# Patient Record
Sex: Female | Born: 1935 | Race: White | Hispanic: No | Smoking: Never smoker
Health system: Southern US, Community
[De-identification: ages and names within clinical notes are randomized; demographics above are authoritative.]

## PROBLEM LIST (undated history)

## (undated) DIAGNOSIS — M1990 Unspecified osteoarthritis, unspecified site: Secondary | ICD-10-CM

## (undated) DIAGNOSIS — K219 Gastro-esophageal reflux disease without esophagitis: Secondary | ICD-10-CM

## (undated) DIAGNOSIS — E039 Hypothyroidism, unspecified: Secondary | ICD-10-CM

## (undated) DIAGNOSIS — H539 Unspecified visual disturbance: Secondary | ICD-10-CM

## (undated) DIAGNOSIS — E079 Disorder of thyroid, unspecified: Secondary | ICD-10-CM

## (undated) DIAGNOSIS — I2510 Atherosclerotic heart disease of native coronary artery without angina pectoris: Secondary | ICD-10-CM

## (undated) DIAGNOSIS — I779 Disorder of arteries and arterioles, unspecified: Secondary | ICD-10-CM

## (undated) DIAGNOSIS — R0600 Dyspnea, unspecified: Secondary | ICD-10-CM

## (undated) DIAGNOSIS — F32A Depression, unspecified: Secondary | ICD-10-CM

## (undated) DIAGNOSIS — H3530 Unspecified macular degeneration: Secondary | ICD-10-CM

## (undated) DIAGNOSIS — I739 Peripheral vascular disease, unspecified: Secondary | ICD-10-CM

## (undated) HISTORY — DX: Disorder of arteries and arterioles, unspecified: I77.9

## (undated) HISTORY — PX: CATARACT EXTRACTION, BILATERAL: SHX1313

## (undated) HISTORY — DX: Gastro-esophageal reflux disease without esophagitis: K21.9

## (undated) HISTORY — DX: Disorder of thyroid, unspecified: E07.9

## (undated) HISTORY — DX: Peripheral vascular disease, unspecified: I73.9

## (undated) HISTORY — PX: CHOLECYSTECTOMY: SHX55

## (undated) HISTORY — PX: ESOPHAGOGASTRODUODENOSCOPY ENDOSCOPY: SHX5814

## (undated) HISTORY — DX: Unspecified visual disturbance: H53.9

## (undated) HISTORY — PX: COLONOSCOPY: SHX174

---

## 1998-02-15 ENCOUNTER — Other Ambulatory Visit: Admission: RE | Admit: 1998-02-15 | Discharge: 1998-02-15 | Payer: Self-pay | Admitting: *Deleted

## 1999-03-08 ENCOUNTER — Other Ambulatory Visit: Admission: RE | Admit: 1999-03-08 | Discharge: 1999-03-08 | Payer: Self-pay | Admitting: *Deleted

## 2000-03-06 ENCOUNTER — Other Ambulatory Visit: Admission: RE | Admit: 2000-03-06 | Discharge: 2000-03-06 | Payer: Self-pay | Admitting: *Deleted

## 2000-05-25 ENCOUNTER — Encounter: Payer: Self-pay | Admitting: Occupational Medicine

## 2000-05-25 ENCOUNTER — Encounter: Admission: RE | Admit: 2000-05-25 | Discharge: 2000-05-25 | Payer: Self-pay | Admitting: Occupational Medicine

## 2001-02-09 ENCOUNTER — Ambulatory Visit (HOSPITAL_COMMUNITY): Admission: RE | Admit: 2001-02-09 | Discharge: 2001-02-09 | Payer: Self-pay | Admitting: *Deleted

## 2001-02-09 ENCOUNTER — Encounter (INDEPENDENT_AMBULATORY_CARE_PROVIDER_SITE_OTHER): Payer: Self-pay | Admitting: *Deleted

## 2001-02-25 ENCOUNTER — Other Ambulatory Visit: Admission: RE | Admit: 2001-02-25 | Discharge: 2001-02-25 | Payer: Self-pay | Admitting: *Deleted

## 2002-04-08 ENCOUNTER — Encounter (INDEPENDENT_AMBULATORY_CARE_PROVIDER_SITE_OTHER): Payer: Self-pay | Admitting: Specialist

## 2002-04-08 ENCOUNTER — Ambulatory Visit (HOSPITAL_COMMUNITY): Admission: RE | Admit: 2002-04-08 | Discharge: 2002-04-08 | Payer: Self-pay | Admitting: *Deleted

## 2004-02-14 ENCOUNTER — Encounter (INDEPENDENT_AMBULATORY_CARE_PROVIDER_SITE_OTHER): Payer: Self-pay | Admitting: Specialist

## 2004-02-14 ENCOUNTER — Ambulatory Visit (HOSPITAL_COMMUNITY): Admission: RE | Admit: 2004-02-14 | Discharge: 2004-02-14 | Payer: Self-pay | Admitting: *Deleted

## 2005-03-26 ENCOUNTER — Encounter (INDEPENDENT_AMBULATORY_CARE_PROVIDER_SITE_OTHER): Payer: Self-pay | Admitting: Specialist

## 2005-03-26 ENCOUNTER — Ambulatory Visit (HOSPITAL_COMMUNITY): Admission: RE | Admit: 2005-03-26 | Discharge: 2005-03-26 | Payer: Self-pay | Admitting: *Deleted

## 2005-04-24 ENCOUNTER — Encounter (HOSPITAL_COMMUNITY): Admission: RE | Admit: 2005-04-24 | Discharge: 2005-07-23 | Payer: Self-pay | Admitting: Internal Medicine

## 2005-05-06 ENCOUNTER — Ambulatory Visit (HOSPITAL_COMMUNITY): Admission: RE | Admit: 2005-05-06 | Discharge: 2005-05-06 | Payer: Self-pay | Admitting: Internal Medicine

## 2005-05-22 ENCOUNTER — Ambulatory Visit (HOSPITAL_COMMUNITY): Admission: RE | Admit: 2005-05-22 | Discharge: 2005-05-22 | Payer: Self-pay | Admitting: Internal Medicine

## 2006-03-26 ENCOUNTER — Encounter (INDEPENDENT_AMBULATORY_CARE_PROVIDER_SITE_OTHER): Payer: Self-pay | Admitting: *Deleted

## 2006-03-26 ENCOUNTER — Ambulatory Visit (HOSPITAL_COMMUNITY): Admission: RE | Admit: 2006-03-26 | Discharge: 2006-03-26 | Payer: Self-pay | Admitting: *Deleted

## 2006-11-27 ENCOUNTER — Encounter: Admission: RE | Admit: 2006-11-27 | Discharge: 2006-11-27 | Payer: Self-pay | Admitting: Internal Medicine

## 2006-11-27 ENCOUNTER — Inpatient Hospital Stay (HOSPITAL_COMMUNITY): Admission: EM | Admit: 2006-11-27 | Discharge: 2006-12-01 | Payer: Self-pay | Admitting: Emergency Medicine

## 2006-11-30 ENCOUNTER — Encounter (INDEPENDENT_AMBULATORY_CARE_PROVIDER_SITE_OTHER): Payer: Self-pay | Admitting: Specialist

## 2008-02-07 ENCOUNTER — Ambulatory Visit (HOSPITAL_COMMUNITY): Admission: RE | Admit: 2008-02-07 | Discharge: 2008-02-07 | Payer: Self-pay | Admitting: *Deleted

## 2008-02-07 ENCOUNTER — Encounter (INDEPENDENT_AMBULATORY_CARE_PROVIDER_SITE_OTHER): Payer: Self-pay | Admitting: *Deleted

## 2010-12-17 NOTE — Op Note (Signed)
NAME:  Donna Petty, FULLAM NO.:  [id number]   MEDICAL RECORD NO.:  [id number]          PATIENT TYPE:  AMB   LOCATION:  ENDO                         FACILITY:  [facility]   PHYSICIAN:  Georgiana Spinner, M.D.    DATE OF BIRTH:  1936/08/04   DATE OF PROCEDURE:  02/07/2008  DATE OF DISCHARGE:                               OPERATIVE REPORT   PROCEDURE:  Upper endoscopy with biopsy.   INDICATIONS:  GERD with Barrett's esophagus.   ANESTHESIA:  Fentanyl 50 mcg, Versed 5 mg.   DESCRIPTION OF PROCEDURE:  With the patient mildly sedated in the left  lateral decubitus position, the Pentax videoscopic endoscope was  inserted in the mouth, passed under direct vision through the esophagus  which appeared normal, but there were some changes, subtle, but there  was Barrett's esophagus photographed and biopsies taken.  We entered  into the stomach, fundus, body, antrum, duodenal bulb, and second  portion of the duodenum appeared normal.   From this point, the endoscope was slowly withdrawn taking  circumferential views of duodenal mucosa until the endoscope was then  pulled back and the stomach placed in retroflexion to view the stomach  from below.  The endoscope was straightened and withdrawn taking  circumferential views of the remaining gastric and esophageal mucosa.  The patient's vital signs and pulse oximeter remained stable.  The  patient tolerated the procedure well without apparent complications.   FINDINGS:  Changes of Barrett's esophagus biopsied, await biopsy report.  The patient will call me for results and follow up with me as an  outpatient.           ______________________________  Georgiana Spinner, M.D.     GMO/MEDQ  D:  02/07/2008  T:  02/07/2008  Job:  [id number]

## 2010-12-20 NOTE — Procedures (Signed)
[place]  Patient:    Donna Petty, Donna Petty                        MRN: [id number] Proc. Date: 02/09/01 Adm. Date:  [id number] Attending:  Sabino Gasser                           Procedure Report  PROCEDURE PERFORMED:  Upper endoscopy with biopsy.  ENDOSCOPIST:  Sabino Gasser, M.D.  INDICATIONS FOR PROCEDURE:  Barretts esophagus.  ANESTHESIA:  Demerol 40 mg, Versed 4 mg.  DESCRIPTION OF PROCEDURE:  With the patient mildly sedated in the left lateral decubitus position, the Olympus video endoscope was inserted in the mouth and passed under direct vision through the esophagus.  Barretts esophagus was seen and photographed.  Biopsies taken.  We entered into the stomach.  Fundus, body, antrum, duodenal bulb and second portion of the duodenum were all well-visualized.  Photographs were taken.  From this point, the endoscope was slowly withdrawn taking circumferential views of the entire duodenal mucosa until the endoscope had been pulled back into the stomach and placed on retroflexion to view the stomach from below and this was photographed.  The endoscope was then straightened and withdrawn taking circumferential views of the entire gastric mucosa and subsequently esophageal mucosa, all of which appeared normal.  Patients vital signs and pulse oximeter remained stable. The patient tolerated the procedure well without apparent complications.  FINDINGS:  Barretts esophagus above the hiatal hernia biopsied.  Await biopsy report.  The patient will call me for results and follow up with me as an outpatient.  Proceed to colonoscopy as planned. DD:  02/09/01 TD:  02/09/01 Job: [id number]

## 2010-12-20 NOTE — H&P (Signed)
NAME:  Donna Petty, Donna Petty NO.:  [id number]   MEDICAL RECORD NO.:  [id number]          PATIENT TYPE:  INP   LOCATION:  5707                         FACILITY:  [facility]   PHYSICIAN:  Maisie Fus A. Cornett, M.D.DATE OF BIRTH:  October 24, 1935   DATE OF ADMISSION:  11/27/2006  DATE OF DISCHARGE:                              HISTORY & PHYSICAL   CHIEF COMPLAINT:  Right upper quadrant pain.   HISTORY OF PRESENT ILLNESS:  The patient is a 75 year old female with a  1-1/2 day history of severe right upper quadrant pain which started  Wednesday.  The pain was severe associated with nausea and vomiting.  The pain today though is much better and has gone away.  She denies any  current abdominal pain and was able to keep a small amount of liquid and  food down this morning, but has not eaten much.  Denies any fever or  chills.  She has history of Barrett's esophagus and takes Nexium for  that and has had numerous endoscopies by Dr. Sabino Gasser.  The pain was  located in the right upper quadrant without radiation.  It was severe in  nature on Wednesday and then has gotten better since then.  She denies  any diarrhea, but had nausea and vomiting.  The pain seemed to lessen on  its own.   PAST MEDICAL HISTORY:  1. Barrett's esophagus.  2. Macular degeneration.  3. Hiatal hernia.   PAST SURGICAL HISTORY:  Numerous upper endoscopies and colonoscopies for  Barrett's and colonoscopy was negative as a screening.   SURGICAL HISTORY:  None.   FAMILY HISTORY:  Noncontributory.   SOCIAL HISTORY:  She lives by herself.  Denies tobacco or alcohol use.   ALLERGIES:  No known drug allergies.   MEDICATIONS:  Nexium, Estrogen hormone, multivitamin for macular  degeneration.   REVIEW OF SYSTEMS:  Otherwise negative except for as stated above.  Her  15-point review of systems negative.   PHYSICAL EXAMINATION:  VITAL SIGNS:  Pulse 80, respirations 20,  temperature 97.  GENERAL:  A white female  in no apparent distress out in the hallway.  HEENT:  No evidence of scleral icterus.  Oropharynx is moist.  NECK:  Supple, nontender, full range of motion.  Trachea midline without  mass.  PULMONARY:  Lungs clear to auscultation.  Chest wall motion normal.  CARDIOVASCULAR:  Regular rate and rhythm without murmur or gallop.  Good  perfusion of extremities.  ABDOMEN:  Soft, nontender.  No rebound, no guarding and specifically  negative Murphy's sign.  No tenderness to palpation.  Bowel sounds are  normoactive.  EXTREMITIES:  Full range of motion.  Muscle tone normal.  NEUROLOGIC:  Glasgow coma scale 15 with full range of motion.   LABORATORY DATA AND X-RAY FINDINGS:  CT scan which has a thickened  gallbladder wall with gallstones.   White count of 17,000 from her primary care physician's office.   IMPRESSION:  Acute cholecystitis, now much improved, but significant  findings on CT with a leukocytosis.   PLAN:  At this point in time, she  is not acutely ill, but does have an  elevated white count and thickened gallbladder with gallstones.  She  will need to have a cholecystectomy during this admission and will be  admitted for IV hydration for antibiotics and subsequent laparoscopic  cholecystectomy.  I have discussed with her daughter and the patient and  understand and agree to proceed.      Thomas A. Cornett, M.D.  Electronically Signed     TAC/MEDQ  D:  11/27/2006  T:  11/27/2006  Job:  [id number]   cc:   Jordan Hawks. Elnoria Howard, MD

## 2010-12-20 NOTE — Op Note (Signed)
NAME:  Donna Petty, Donna Petty                           ACCOUNT NO.:  [id number]   MEDICAL RECORD NO.:  [id number]                   PATIENT TYPE:  AMB   LOCATION:  ENDO                                 FACILITY:  [facility]   PHYSICIAN:  Georgiana Spinner, M.D.                 DATE OF BIRTH:  07-10-1936   DATE OF PROCEDURE:  02/14/2004  DATE OF DISCHARGE:                                 OPERATIVE REPORT   PROCEDURE:  Upper endoscopy with biopsy.   INDICATIONS:  Barrett's esophagus.   ANESTHESIA:  1. Demerol 40.  2. Versed 6 mg.   PROCEDURE:  With patient mildly sedated in the left lateral decubitus  position, the Olympus videoscopic endoscope was inserted in the mouth,  passed under direct vision through the esophagus which appeared normal until  we reached the distal esophagus and Barrett's was seen, photographed and  biopsied.  We entered into the stomach, fundus, body, antrum, duodenal bulb,  second portion; all appeared normal.  From this point the endoscope was  slowly withdrawn, taking circumferential views of duodenal mucosa until the  endoscope had been pulled back and the stomach placed in retroflexion to  view the stomach from below.  The endoscope was straightened and withdrawn,  taking circumferential views of the remaining gastric and esophageal mucosa.  The patient's vital signs and pulse oximeter remained stable.  The patient  tolerated the procedure well without apparent complications.   FINDINGS:  Barrett's esophagus biopsied.  Await biopsy report.  Patient will  call me for results and followup with me as an outpatient.                                               Georgiana Spinner, M.D.    GMO/MEDQ  D:  02/14/2004  T:  02/14/2004  Job:  [id number]

## 2010-12-20 NOTE — Op Note (Signed)
NAME:  Donna Petty, SCRIVNER NO.:  [id number]   MEDICAL RECORD NO.:  [id number]          PATIENT TYPE:  AMB   LOCATION:  ENDO                         FACILITY:  [facility]   PHYSICIAN:  Georgiana Spinner, M.D.    DATE OF BIRTH:  07-Aug-1935   DATE OF PROCEDURE:  03/26/2006  DATE OF DISCHARGE:                                 OPERATIVE REPORT   PROCEDURE:  Upper endoscopy.   INDICATIONS:  GERD.   ANESTHESIA:  Fentanyl 60 mcg, Versed 6 mg.   PROCEDURE:  With the patient mildly sedated in the left lateral decubitus  position, the Olympus videoscopic endoscope was inserted in the mouth and  passed under direct vision through the esophagus which appeared normal until  we reached distal esophagus and the squamocolumnar junction was somewhat  irregular with spotty islands of Barrett's.  We photographed this area and  biopsied this area.  We then entered into the stomach.  The fundus, body,  antrum, duodenal bulb, and second portion of the duodenum were visualized.  From this point, the endoscope was slowly withdrawn taking circumferential  views of the duodenal mucosa until the endoscope had been pulled back in the  stomach and placed in retroflexion to view the stomach from below. The  endoscope was then straightened and withdrawn taking circumferential views  of the remaining gastric and esophageal mucosa.  The patient's vital signs  and pulse oximeter remained stable.  The patient tolerated the procedure  well without apparent complications.   FINDINGS:  Barrett's esophagus above a hiatal hernia, await biopsy report.  The patient will call me for results and follow-up with me as an outpatient.           ______________________________  Georgiana Spinner, M.D.     GMO/MEDQ  D:  03/26/2006  T:  03/26/2006  Job:  [id number]

## 2010-12-20 NOTE — Op Note (Signed)
NAME:  Donna Petty, Donna Petty NO.:  [id number]   MEDICAL RECORD NO.:  [id number]          PATIENT TYPE:  AMB   LOCATION:  ENDO                         FACILITY:  [facility]   PHYSICIAN:  Georgiana Spinner, M.D.    DATE OF BIRTH:  [birth year]   DATE OF PROCEDURE:  03/26/2005  DATE OF DISCHARGE:                                 OPERATIVE REPORT   PROCEDURE:  Upper endoscopy.   INDICATIONS:  Gastroesophageal reflux disease with Barrett's esophagus.   ANESTHESIA:  Demerol 50, Versed 4 mg.   DESCRIPTION OF PROCEDURE:  With the patient mildly sedated in the left  lateral decubitus position, the Olympus videoscopic endoscope was inserted  in the mouth and passed under direct vision through the esophagus which  appeared normal until we reached the distal esophagus and although the  squamocolumnar junction was indistinct on close view, there appeared to be  areas of Barrett's esophagus which we photographed and biopsied. Also there  was a nodularity noted just where the stomach met the diaphragmatic opening  and this was biopsied and placed in the same container. We then entered into  the stomach. The fundus, body, antrum, duodenal bulb, and second portion of  duodenum all appeared normal. From this point, the endoscope was slowly  withdrawn taking circumferential views of the duodenal mucosa until the  endoscope had been pulled back in the stomach placed in retroflexion to view  the stomach from below. The endoscope was straightened and withdrawn taking  circumferential views of the remaining gastric and esophageal mucosa. The  patient's vital signs and pulse oximeter remained stable. The patient  tolerated the procedure well without apparent complications.   FINDINGS:  What appeared to be Barrett's esophagus biopsied, await biopsy  report. The patient will call me for results and follow-up with me as an  outpatient           ______________________________  Georgiana Spinner,  M.D.     GMO/MEDQ  D:  03/26/2005  T:  03/26/2005  Job:  [id number]

## 2010-12-20 NOTE — Op Note (Signed)
[place]  Patient:    Donna Petty, Donna Petty                        MRN: [id number] Proc. Date: 02/09/01 Adm. Date:  [id number] Attending:  Sabino Gasser                           Operative Report  PROCEDURE:  Colonoscopy.  INDICATIONS:  Colon cancer screening, hemoccult positivity.  ANESTHESIA:  None further given.  DESCRIPTION OF PROCEDURE:  With the patient mildly sedated in the left lateral decubitus position, the Olympus videoscopic colonoscope was inserted into the rectum, passed under direct vision to the cecum, identified by the ileocecal valve and appendiceal orifice, both of which were photographed.  From this point, the colonoscope was slowly withdrawn taking circumferential views of the entire colonic mucosa stopping only in the rectum which appeared normal in direct view and showed internal hemorrhoids on retroflexed view.  The endoscope was straightened and withdrawn.  The patients vital signs and pulse oximetry remained stable.  The patient tolerated the procedure well without apparent complications.  FINDINGS:  Internal hemorrhoids, otherwise unremarkable examination.  PLAN:  Have the patient follow up with me as described above. DD:  02/09/01 TD:  02/09/01 Job: [id number]

## 2010-12-20 NOTE — Op Note (Signed)
NAME:  Donna Petty, Donna Petty NO.:  [id number]   MEDICAL RECORD NO.:  [id number]          PATIENT TYPE:  INP   LOCATION:  5707                         FACILITY:  [facility]   PHYSICIAN:  Maisie Fus A. Cornett, M.D.DATE OF BIRTH:  1936/05/18   DATE OF PROCEDURE:  11/29/2006  DATE OF DISCHARGE:                               OPERATIVE REPORT   PREOPERATIVE DIAGNOSIS:  Acute cholecystitis.   POSTOPERATIVE DIAGNOSIS:  Acute cholecystitis.   PROCEDURE:  Laparoscopic cholecystectomy with intraoperative  cholangiogram.   SURGEON:  Thomas A. Cornett, M.D.   ANESTHESIA:  General endotracheal anesthesia.   ESTIMATED BLOOD LOSS:  Roughly 100 mL.   DRAINS:  One 60 Blake drain to gallbladder fossa.   SPECIMEN:  Gallbladder with gallstones to pathology.   INDICATIONS FOR PROCEDURE:  The patient is a 75 year old female admitted  with acute cholecystitis.  She is brought today to the operating room  for laparoscopic cholecystectomy with cholangiogram.  The procedure was  discussed with the patient as well as potential complications.  She  voiced her understanding and agreed to proceed.   DESCRIPTION OF PROCEDURE:  The patient was brought to the operating room  and placed supine.  After induction of general anesthesia, the abdomen  was prepped and draped in a sterile fashion.   A 1-cm infraumbilical incision was made.  Dissection was carried down to  the fascia.  The fascia was grasped with Kochers, and a small incision  was made.  I then was slightly off midline and reached down and grabbed  the posterior sheath and right side and made an incision with a scalpel  and entered the abdominal cavity without any difficulty.  I placed my  finger in and swept around and felt no evidence of adhesions or other  problem.  A pursestring suture of 0 Vicryl was placed, and a 12-mm  Hassan cannula was placed under direct vision.  Pneumoperitoneum was  created to 15 mmHg of CO2.  The laparoscope  was placed.  No evidence of  bowel or other reported injury.  The patient was placed in reverse  Trendelenburg and rolled to her left.  An 11-mm subxiphoid port was  placed under direct vision with local anesthesia.  Two 5-mm ports were  placed in the right midabdomen with local anesthesia under direct  vision.  The gallbladder was acutely inflamed.  I was able to grab the  dome and then pull the omentum off the gallbladder.  We did this  bluntly, with care taken not to injure the colon.  We were able then to  expose the infundibulum and grab it.  I then had to bluntly dissect the  loose tissue away from the infundibulum it between it and the duodenum.  This was done without any injury to the duodenum.  After this was done,  I was able to dissect out the cystic duct.  We were dissecting around  this and tore a small hole in the posterior part of the cystic duct.  I  went ahead and placed a clip on the gallbladder side and then opened  this hole a little bit more with scissors.  Through a separate stab  incision, I placed a Cook cholangiogram catheter into this and  controlled it with the clip.  Intraoperative cholangiogram was then  performed.  There was free flow of contrast down a very long posterior-  oriented cystic duct.  It inserted low in the common duct it looked  like.  There was one frame where a small air bubble went through.  Free  flow of contrast up the common hepatic duct into the right and left  hepatic ducts.  Contrast flowed through the common hepatic and common  bile duct into the duodenum without evidence of obstruction or delay.  I  did not see any evidence of extravasation.  At this point in time, I  removed the catheter.  I put four clips across the cystic duct stump and  divided the cystic duct in its entirety.  The cystic arteries were  identified.  There was an anterior and posterior branch of this.  It  took the anterior branch first between clips.  I then took  the posterior  branch as well with a single clip.  Cautery was used do dissect the  gallbladder from the gallbladder fossa.  The gallbladder was quite  inflamed and necrotic, and there was an area where the wall had a small  tear in it.  There was no evidence of any spillage of stones, but there  was purulent-like material within it.  Were able to control this with a  grasper.  I then used electrocautery to dissect the gallbladder from the  gallbladder fossa.  There were small veins that I controlled with clips  in the gallbladder fossa going to the gallbladder.  Once the gallbladder  was removed, I placed it in an EndoCatch bag.  I then examined the  gallbladder bed and found it to be quite oozy.  I then used irrigation  and cautery to help control oozing from the gallbladder bed and placed  Surgicel in the gallbladder bed.  I inspected the cystic duct stump and  cystic artery, and the clips were on these appropriately.  I then  elected to use a 19 Blake drain given the amount of oozing and amount of  bile spillage and placed this through the 5-mm port into the gallbladder  fossa and secured it with a stitch to the skin of 2-0 nylon.  The  remainder of the irrigation was suctioned out.  I then used the grasper  to grab the gallbladder bag that had the gallbladder in it and extracted  it through the umbilical port without difficulty.  This was passed off  the field and sent to pathology for further evaluation.  I then  reinserted the scope after I closed the umbilical port site with the 0  Vicryl already in place.  I suctioned out any excess irrigation and  flattened the patient out.  I saw no evidence of bile leakage or  bleeding.  At this point in time, I let the CO2 escape in its entirety  and withdrew all my ports without difficulty or bleeding.  A 4-0  Monocryl was used to close all skin incisions.  Steri-Strips and dry dressings were applied.  All final counts of sponge, needle and   instruments were found to be correct at this portion of the case.   The patient was then awakened and taken to recovery in satisfactory  condition.      Thomas A.  Cornett, M.D.  Electronically Signed     TAC/MEDQ  D:  11/29/2006  T:  11/29/2006  Job:  [id number]   cc:   Juline Patch, M.D.

## 2010-12-20 NOTE — Discharge Summary (Signed)
NAME:  ESHAL, Donna Petty NO.:  [id number]   MEDICAL RECORD NO.:  [id number]          PATIENT TYPE:  INP   LOCATION:  5707                         FACILITY:  [facility]   PHYSICIAN:  Leonie Man, M.D.   DATE OF BIRTH:  [birth year]   DATE OF ADMISSION:  11/27/2006  DATE OF DISCHARGE:  12/01/2006                               DISCHARGE SUMMARY   CHIEF COMPLAINT:  [name] is a 75 year old female patient with 1-1/2  days history of severe right upper quadrant pain associated with nausea  and vomiting. The pain was very severe prompting the patient to report  to the ER but by the time Maisie Fus A. Cornett, M.D. evaluated the patient  the pain had resolved. The patient also has a history of Barrett's  esophagitis and is followed by Georgiana Spinner, M.D. for this problem. In  the ER the patient was afebrile, vital signs were stable.  Abdomen soft,  nontender.  No Murphy's sign was noted. CT scan was done that  demonstrated thickened gallbladder wall with gallstones. White count had  been done.  The patient had been seen at the primary care physician's  office before presenting to the ER and this was 17,000.  The patient was  admitted with a diagnosis of cholelithiasis and acute cholecystitis.   HOSPITAL COURSE:  The patient was admitted to the hospital on November 28, 2006 with plans to proceed with a laparoscopic cholecystectomy in the  next 24 hours.  She was started on IV Cipro empirically, IV fluids and  plans were to proceed with surgery the following day.   On November 29, 2006 the patient did undergo a laparoscopic cholecystectomy  with intraoperative cholangiogram for a diagnosis of acute  cholecystitis.  The patient tolerated procedure well and one Blake drain  was placed in the OR and attached to JP bulb suction in the  postoperative period.   On postop day #1 the patient was stable.  Her preoperative total  bilirubin had been 2.4 and had decreased to 1.6.  She was  having about  120 mL out of her JP drain and immediately post surgery with anesthesia  still on board she had about 100 mL emesis.  She reports she did not  like clear liquids and has not otherwise advanced her diet was not  having any pain. Abdomen was soft.  Bowel sounds were present.  Her JP  was bloody serous, otherwise she was stable. Diet was advanced and  tolerating a solid she was advanced to oral Vicodin.  She was kept on IV  Cipro and plans were to leave her JP drain in place for now.   On December 01, 2006 the patient was seen by Leonie Man, M.D.  She had  been tolerating a diet and oral Vicodin and she had no further symptoms  and she was considered appropriate for discharge home.   DISCHARGE DIAGNOSES:  1. Cholelithiasis with acute cholecystitis and transaminitis.  2. Status post laparoscopic cholecystectomy.  3. History of Barrett's esophagitis.   DISCHARGE MEDICATIONS:  1. Vicodin 5/325 1-2 pills every  4 hours as needed for pain.  2. Premarin 0.625 mg daily.  3. Prometrium 100 mg daily.  4. Nexium 40 mg daily.  5. Glucosamine complex daily.  6. ICaps twice daily.  7. Multiple vitamin daily.  8. Calcium with vitamin D daily.   DIET:  No restrictions.   ACTIVITY:  Increase activity slowly.  May walk up steps.  May shower.  Follow up with Dr. Luisa Hart. Need to be seen in 1-2 weeks and need to  call for appointment.  Additionally instructions for laparoscopic  cholecystectomy were given to the patient. These were [facility]  instructions.  The patient is to call if she has fevers, new or  increasing abdominal pain and redness or drainage from her wound.      Allison L. Rennis Harding, N.P.      Leonie Man, M.D.  Electronically Signed    ALE/MEDQ  D:  01/06/2007  T:  01/07/2007  Job:  [id number]   cc:   Thomas A. Cornett, M.D.

## 2010-12-20 NOTE — Op Note (Signed)
   [name], Donna Petty NO.:  [id number]   MEDICAL RECORD NO.:  [id number]                   PATIENT TYPE:  AMB   LOCATION:  ENDO                                 FACILITY:  [facility]   PHYSICIAN:  Georgiana Spinner, M.D.                 DATE OF BIRTH:  01-17-1936   DATE OF PROCEDURE:  DATE OF DISCHARGE:                                 OPERATIVE REPORT   PROCEDURE:  Upper endoscopy with biopsy.   INDICATIONS FOR PROCEDURE:  Barrett's esophagus, gastroesophageal reflux  disease.   ANESTHESIA:  Demerol 50, Versed 5 mg.   DESCRIPTION OF PROCEDURE:  With the patient mildly sedated in the left  lateral decubitus position, the Olympus videoscopic endoscope was inserted  in the mouth and passed under direct vision through the esophagus which  appeared normal until we reached the distal esophagus and there were changes  of Barrett's esophagus seen, photographed and biopsied. The endoscope was  advanced to the stomach. The fundus, body, antrum, duodenal bulb and second  portion of the duodenum appeared normal. From this point, the endoscope was  slowly withdrawn taking circumferential views of the entire duodenal mucosa  until the endoscope was then pulled back in the stomach, placed in  retroflexion to view the stomach from below. The endoscope was then  straightened and withdrawn taking circumferential views of the remaining  gastric and esophageal mucosa. The patient's vital signs and pulse oximeter  remained stable. The patient tolerated the procedure well without apparent  complications.   FINDINGS:  Barrett's esophagus biopsied, await biopsy report. The patient  will call me for results and followup with me as an outpatient.                                                Georgiana Spinner, M.D.    GMO/MEDQ  D:  04/08/2002  T:  04/08/2002  Job:  [id number]

## 2011-02-13 ENCOUNTER — Other Ambulatory Visit: Payer: Self-pay | Admitting: Gastroenterology

## 2011-10-16 ENCOUNTER — Other Ambulatory Visit: Payer: Self-pay | Admitting: Obstetrics and Gynecology

## 2012-09-30 ENCOUNTER — Other Ambulatory Visit: Payer: Self-pay | Admitting: Gastroenterology

## 2013-08-17 ENCOUNTER — Other Ambulatory Visit (HOSPITAL_COMMUNITY): Payer: Self-pay | Admitting: Internal Medicine

## 2013-08-17 DIAGNOSIS — R05 Cough: Secondary | ICD-10-CM

## 2013-08-17 DIAGNOSIS — R059 Cough, unspecified: Secondary | ICD-10-CM

## 2013-08-17 LAB — PULMONARY FUNCTION TEST
DL/VA % pred: 95 %
DL/VA: 4.22 ml/min/mmHg/L
DLCO COR: 14.76 ml/min/mmHg
DLCO UNC % PRED: 72 %
DLCO cor % pred: 72 %
DLCO unc: 14.76 ml/min/mmHg
FEF 25-75 PRE: 1.25 L/s
FEF 25-75 Post: 1.1 L/sec
FEF2575-%CHANGE-POST: -12 %
FEF2575-%Pred-Post: 74 %
FEF2575-%Pred-Pre: 85 %
FEV1-%CHANGE-POST: -1 %
FEV1-%Pred-Post: 92 %
FEV1-%Pred-Pre: 94 %
FEV1-Post: 1.68 L
FEV1-Pre: 1.71 L
FEV1FVC-%Change-Post: 3 %
FEV1FVC-%Pred-Pre: 99 %
FEV6-%Change-Post: -4 %
FEV6-%PRED-POST: 93 %
FEV6-%Pred-Pre: 97 %
FEV6-POST: 2.17 L
FEV6-PRE: 2.26 L
FEV6FVC-%CHANGE-POST: 0 %
FEV6FVC-%PRED-PRE: 103 %
FEV6FVC-%Pred-Post: 104 %
FVC-%Change-Post: -5 %
FVC-%PRED-PRE: 93 %
FVC-%Pred-Post: 89 %
FVC-POST: 2.17 L
FVC-PRE: 2.29 L
POST FEV6/FVC RATIO: 100 %
Post FEV1/FVC ratio: 77 %
Pre FEV1/FVC ratio: 75 %
Pre FEV6/FVC Ratio: 99 %
RV % PRED: 90 %
RV: 1.94 L
TLC % PRED: 90 %
TLC: 4.16 L

## 2013-08-24 ENCOUNTER — Ambulatory Visit (HOSPITAL_COMMUNITY)
Admission: RE | Admit: 2013-08-24 | Discharge: 2013-08-24 | Disposition: A | Discharge status: Home / Self Care | Payer: Medicare Other | Source: Ambulatory Visit | Attending: Internal Medicine | Admitting: Internal Medicine

## 2013-08-24 DIAGNOSIS — R0602 Shortness of breath: Secondary | ICD-10-CM | POA: Insufficient documentation

## 2013-08-24 DIAGNOSIS — R059 Cough, unspecified: Secondary | ICD-10-CM | POA: Insufficient documentation

## 2013-08-24 DIAGNOSIS — R05 Cough: Secondary | ICD-10-CM | POA: Insufficient documentation

## 2013-08-24 MED ORDER — ALBUTEROL SULFATE (2.5 MG/3ML) 0.083% IN NEBU
2.5000 mg | INHALATION_SOLUTION | Freq: Once | RESPIRATORY_TRACT | Status: AC
  Administered 2013-08-24: 2.5 mg via RESPIRATORY_TRACT

## 2014-03-15 ENCOUNTER — Other Ambulatory Visit: Payer: Self-pay | Admitting: Internal Medicine

## 2014-03-15 DIAGNOSIS — R0989 Other specified symptoms and signs involving the circulatory and respiratory systems: Secondary | ICD-10-CM

## 2014-03-29 ENCOUNTER — Ambulatory Visit
Admission: RE | Admit: 2014-03-29 | Discharge: 2014-03-29 | Disposition: A | Discharge status: Home / Self Care | Payer: Medicare Other | Source: Ambulatory Visit | Attending: Internal Medicine | Admitting: Internal Medicine

## 2014-03-29 DIAGNOSIS — R0989 Other specified symptoms and signs involving the circulatory and respiratory systems: Secondary | ICD-10-CM

## 2014-04-05 ENCOUNTER — Telehealth: Payer: Self-pay | Admitting: Cardiovascular Disease

## 2014-04-06 NOTE — Telephone Encounter (Signed)
Closed encounter °

## 2014-04-27 ENCOUNTER — Encounter: Payer: Self-pay | Admitting: Cardiovascular Disease

## 2014-04-27 ENCOUNTER — Ambulatory Visit (INDEPENDENT_AMBULATORY_CARE_PROVIDER_SITE_OTHER): Payer: Medicare Other | Admitting: Cardiovascular Disease

## 2014-04-27 VITALS — BP 158/80 | HR 57 | Ht 61.0 in | Wt 166.4 lb

## 2014-04-27 DIAGNOSIS — I6529 Occlusion and stenosis of unspecified carotid artery: Secondary | ICD-10-CM

## 2014-04-27 DIAGNOSIS — I779 Disorder of arteries and arterioles, unspecified: Secondary | ICD-10-CM

## 2014-04-27 DIAGNOSIS — I739 Peripheral vascular disease, unspecified: Secondary | ICD-10-CM

## 2014-04-27 NOTE — Patient Instructions (Signed)
  We will see you back in follow up in 1 year with Dr Gwenlyn Found.   Dr Gwenlyn Found has ordered: Carotid Duplex (in 6 months)- This test is an ultrasound of the carotid arteries in your neck. It looks at blood flow through these arteries that supply the brain with blood. Allow one hour for this exam. There are no restrictions or special instructions.

## 2014-04-27 NOTE — Assessment & Plan Note (Signed)
The patient has asymptomatic moderate left internal carotid artery stenosis demonstrated by both ultrasound 03/29/14. Her systolic velocities to 64 and diastolic 66. She is neurologically astigmatic. This was obtained because of the os auscultative bruit. She does not have a history of diabetes, hypertension or hyperlipidemia. I suggested that she start baby aspirin and I will repeat her carotid Dopplers in 6 months.

## 2014-04-27 NOTE — Progress Notes (Signed)
04/27/2014 Donna Petty   May 29, 1936  [id number]  Primary Physician Donna Gravel, MD Primary Cardiologist: Donna Harp MD Donna Petty   HPI:   Donna Petty is a delightful 78 year old mildly overweight married Caucasian female mother of one daughter, Donna Petty, who accompanies her today. She was referred through the courtesy of Dr. Jani Petty from [facility] medical for evaluation of astigmatic carotid artery disease. She works as a Estate manager/land agent. She has no chronic risk factors, specifically denies tobacco abuse, hypertension, diabetes or hyperlipidemia. There is no family history of heart disease. She denies chest pain or shortness of breath. She has never had a heart attack or stroke. Recent carotid Dopplers performed 03/29/14 showed moderate left internal carotid artery stenosis and a 50-70% range.    Current Outpatient Prescriptions  Medication Sig Dispense Refill  . Calcium-Vitamin D 600-200 MG-UNIT per tablet Take 1 tablet by mouth daily.      Marland Kitchen glucosamine-chondroitin 500-400 MG tablet Take 1 tablet by mouth daily.      Marland Kitchen ibuprofen (ADVIL,MOTRIN) 200 MG tablet Take 200 mg by mouth as needed.      Marland Kitchen levothyroxine (SYNTHROID, LEVOTHROID) 50 MCG tablet       . Multiple Vitamins-Minerals (ICAPS PO) Take by mouth daily.      Marland Kitchen omeprazole (PRILOSEC) 40 MG capsule        No current facility-administered medications for this visit.    Not on File  History   Social History  . Marital Status: Married    Spouse Name: N/A    Number of Children: N/A  . Years of Education: N/A   Occupational History  . Not on file.   Social History Main Topics  . Smoking status: Never Smoker   . Smokeless tobacco: Not on file  . Alcohol Use: No  . Drug Use: No  . Sexual Activity: Not on file   Other Topics Concern  . Not on file   Social History Narrative  . No narrative on file     Review of Systems: General: negative for chills, fever, night sweats or weight changes.    Cardiovascular: negative for chest pain, dyspnea on exertion, edema, orthopnea, palpitations, paroxysmal nocturnal dyspnea or shortness of breath Dermatological: negative for rash Respiratory: negative for cough or wheezing Urologic: negative for hematuria Abdominal: negative for nausea, vomiting, diarrhea, bright red blood per rectum, melena, or hematemesis Neurologic: negative for visual changes, syncope, or dizziness All other systems reviewed and are otherwise negative except as noted above.    Blood pressure 158/80, pulse 57, height 5\' 1"  (1.549 m), weight 166 lb 6.4 oz (75.479 kg).  General appearance: alert and no distress Neck: no adenopathy, no JVD, supple, symmetrical, trachea midline, thyroid not enlarged, symmetric, no tenderness/mass/nodules and soft left carotid bruit Lungs: clear to auscultation bilaterally Heart: regular rate and rhythm, S1, S2 normal, no murmur, click, rub or gallop Extremities: extremities normal, atraumatic, no cyanosis or edema and 2+ pedal pulses bilaterally  EKG sinus bradycardia of 57 without ST or T wave changes  ASSESSMENT AND PLAN:   Left-sided carotid artery disease The patient has asymptomatic moderate left internal carotid artery stenosis demonstrated by both ultrasound 03/29/14. Her systolic velocities to 64 and diastolic 66. She is neurologically astigmatic. This was obtained because of the os auscultative bruit. She does not have a history of diabetes, hypertension or hyperlipidemia. I suggested that she start baby aspirin and I will repeat her carotid Dopplers in 6 months.  Donna Harp MD FACP,FACC,FAHA, [facility] 04/27/2014 10:48 AM

## 2014-10-25 ENCOUNTER — Ambulatory Visit (INDEPENDENT_AMBULATORY_CARE_PROVIDER_SITE_OTHER): Payer: Medicare Other | Admitting: Cardiovascular Disease

## 2014-10-25 ENCOUNTER — Encounter: Payer: Self-pay | Admitting: Cardiovascular Disease

## 2014-10-25 VITALS — BP 124/72 | HR 59 | Ht 61.0 in | Wt 156.0 lb

## 2014-10-25 DIAGNOSIS — I739 Peripheral vascular disease, unspecified: Principal | ICD-10-CM

## 2014-10-25 DIAGNOSIS — I779 Disorder of arteries and arterioles, unspecified: Secondary | ICD-10-CM

## 2014-10-25 NOTE — Patient Instructions (Signed)
Proceed with carotid dopplers 

## 2014-10-25 NOTE — Progress Notes (Signed)
The patient returned today for unclear reasons and has not yet had her carotid Doppler study which we will reorder. I will see her back based on those results.

## 2014-11-01 ENCOUNTER — Ambulatory Visit (HOSPITAL_COMMUNITY)
Admission: RE | Admit: 2014-11-01 | Discharge: 2014-11-01 | Disposition: A | Discharge status: Home / Self Care | Payer: Medicare Other | Source: Ambulatory Visit | Attending: Cardiovascular Disease | Admitting: Cardiovascular Disease

## 2014-11-01 DIAGNOSIS — I6529 Occlusion and stenosis of unspecified carotid artery: Secondary | ICD-10-CM | POA: Diagnosis present

## 2014-11-01 DIAGNOSIS — I779 Disorder of arteries and arterioles, unspecified: Secondary | ICD-10-CM | POA: Diagnosis not present

## 2014-11-01 NOTE — Progress Notes (Signed)
Carotid Duplex Completed. Asal Teas, BS, RDMS, RVT  

## 2014-11-06 ENCOUNTER — Encounter: Payer: Self-pay | Admitting: *Deleted

## 2015-01-23 ENCOUNTER — Other Ambulatory Visit: Payer: Self-pay | Admitting: Gastroenterology

## 2019-03-30 ENCOUNTER — Other Ambulatory Visit: Payer: Self-pay | Admitting: Internal Medicine

## 2019-03-30 DIAGNOSIS — R413 Other amnesia: Secondary | ICD-10-CM

## 2019-04-08 ENCOUNTER — Ambulatory Visit
Admission: RE | Admit: 2019-04-08 | Discharge: 2019-04-08 | Disposition: A | Discharge status: Home / Self Care | Payer: Medicare Other | Source: Ambulatory Visit | Attending: Internal Medicine | Admitting: Internal Medicine

## 2019-04-08 DIAGNOSIS — R413 Other amnesia: Secondary | ICD-10-CM

## 2019-09-26 ENCOUNTER — Other Ambulatory Visit: Payer: Self-pay | Admitting: Internal Medicine

## 2019-09-26 DIAGNOSIS — M79662 Pain in left lower leg: Secondary | ICD-10-CM

## 2019-09-27 ENCOUNTER — Ambulatory Visit
Admission: RE | Admit: 2019-09-27 | Discharge: 2019-09-27 | Disposition: A | Discharge status: Home / Self Care | Payer: Medicare PPO | Source: Ambulatory Visit | Attending: Internal Medicine | Admitting: Internal Medicine

## 2019-09-27 DIAGNOSIS — M79662 Pain in left lower leg: Secondary | ICD-10-CM

## 2019-11-08 ENCOUNTER — Encounter: Payer: Medicare PPO | Admitting: Vascular Surgery

## 2019-11-08 ENCOUNTER — Encounter (HOSPITAL_COMMUNITY): Payer: Medicare PPO

## 2019-11-14 DIAGNOSIS — M79605 Pain in left leg: Secondary | ICD-10-CM | POA: Diagnosis not present

## 2019-11-14 DIAGNOSIS — M79651 Pain in right thigh: Secondary | ICD-10-CM | POA: Diagnosis not present

## 2019-11-14 DIAGNOSIS — M79652 Pain in left thigh: Secondary | ICD-10-CM | POA: Diagnosis not present

## 2019-11-14 DIAGNOSIS — M79604 Pain in right leg: Secondary | ICD-10-CM | POA: Diagnosis not present

## 2019-11-17 DIAGNOSIS — I10 Essential (primary) hypertension: Secondary | ICD-10-CM | POA: Diagnosis not present

## 2019-11-17 DIAGNOSIS — E039 Hypothyroidism, unspecified: Secondary | ICD-10-CM | POA: Diagnosis not present

## 2019-11-17 DIAGNOSIS — R413 Other amnesia: Secondary | ICD-10-CM | POA: Diagnosis not present

## 2019-11-24 DIAGNOSIS — Z8673 Personal history of transient ischemic attack (TIA), and cerebral infarction without residual deficits: Secondary | ICD-10-CM | POA: Diagnosis not present

## 2019-11-24 DIAGNOSIS — E039 Hypothyroidism, unspecified: Secondary | ICD-10-CM | POA: Diagnosis not present

## 2019-11-24 DIAGNOSIS — M1712 Unilateral primary osteoarthritis, left knee: Secondary | ICD-10-CM | POA: Diagnosis not present

## 2019-11-24 DIAGNOSIS — I10 Essential (primary) hypertension: Secondary | ICD-10-CM | POA: Diagnosis not present

## 2019-11-24 DIAGNOSIS — M1711 Unilateral primary osteoarthritis, right knee: Secondary | ICD-10-CM | POA: Diagnosis not present

## 2019-11-24 DIAGNOSIS — R413 Other amnesia: Secondary | ICD-10-CM | POA: Diagnosis not present

## 2019-11-24 DIAGNOSIS — I872 Venous insufficiency (chronic) (peripheral): Secondary | ICD-10-CM | POA: Diagnosis not present

## 2019-11-24 DIAGNOSIS — E785 Hyperlipidemia, unspecified: Secondary | ICD-10-CM | POA: Diagnosis not present

## 2019-11-24 DIAGNOSIS — Z0000 Encounter for general adult medical examination without abnormal findings: Secondary | ICD-10-CM | POA: Diagnosis not present

## 2019-11-24 DIAGNOSIS — M170 Bilateral primary osteoarthritis of knee: Secondary | ICD-10-CM | POA: Diagnosis not present

## 2019-11-24 DIAGNOSIS — K2270 Barrett's esophagus without dysplasia: Secondary | ICD-10-CM | POA: Diagnosis not present

## 2019-11-24 DIAGNOSIS — K219 Gastro-esophageal reflux disease without esophagitis: Secondary | ICD-10-CM | POA: Diagnosis not present

## 2020-02-27 DIAGNOSIS — M170 Bilateral primary osteoarthritis of knee: Secondary | ICD-10-CM | POA: Diagnosis not present

## 2020-02-27 DIAGNOSIS — M1712 Unilateral primary osteoarthritis, left knee: Secondary | ICD-10-CM | POA: Diagnosis not present

## 2020-02-27 DIAGNOSIS — M1711 Unilateral primary osteoarthritis, right knee: Secondary | ICD-10-CM | POA: Diagnosis not present

## 2020-03-05 DIAGNOSIS — J40 Bronchitis, not specified as acute or chronic: Secondary | ICD-10-CM | POA: Diagnosis not present

## 2020-03-05 DIAGNOSIS — R05 Cough: Secondary | ICD-10-CM | POA: Diagnosis not present

## 2020-03-16 DIAGNOSIS — R110 Nausea: Secondary | ICD-10-CM | POA: Diagnosis not present

## 2020-03-16 DIAGNOSIS — J0110 Acute frontal sinusitis, unspecified: Secondary | ICD-10-CM | POA: Diagnosis not present

## 2020-04-04 DIAGNOSIS — D3131 Benign neoplasm of right choroid: Secondary | ICD-10-CM | POA: Diagnosis not present

## 2020-04-04 DIAGNOSIS — H0100A Unspecified blepharitis right eye, upper and lower eyelids: Secondary | ICD-10-CM | POA: Diagnosis not present

## 2020-04-04 DIAGNOSIS — H04123 Dry eye syndrome of bilateral lacrimal glands: Secondary | ICD-10-CM | POA: Diagnosis not present

## 2020-04-04 DIAGNOSIS — H353112 Nonexudative age-related macular degeneration, right eye, intermediate dry stage: Secondary | ICD-10-CM | POA: Diagnosis not present

## 2020-04-04 DIAGNOSIS — H35342 Macular cyst, hole, or pseudohole, left eye: Secondary | ICD-10-CM | POA: Diagnosis not present

## 2020-05-17 DIAGNOSIS — I10 Essential (primary) hypertension: Secondary | ICD-10-CM | POA: Diagnosis not present

## 2020-05-17 DIAGNOSIS — E039 Hypothyroidism, unspecified: Secondary | ICD-10-CM | POA: Diagnosis not present

## 2020-05-17 DIAGNOSIS — E785 Hyperlipidemia, unspecified: Secondary | ICD-10-CM | POA: Diagnosis not present

## 2020-05-29 DIAGNOSIS — K2270 Barrett's esophagus without dysplasia: Secondary | ICD-10-CM | POA: Diagnosis not present

## 2020-05-29 DIAGNOSIS — R413 Other amnesia: Secondary | ICD-10-CM | POA: Diagnosis not present

## 2020-05-29 DIAGNOSIS — I10 Essential (primary) hypertension: Secondary | ICD-10-CM | POA: Diagnosis not present

## 2020-05-29 DIAGNOSIS — K219 Gastro-esophageal reflux disease without esophagitis: Secondary | ICD-10-CM | POA: Diagnosis not present

## 2020-05-29 DIAGNOSIS — Z23 Encounter for immunization: Secondary | ICD-10-CM | POA: Diagnosis not present

## 2020-05-29 DIAGNOSIS — E039 Hypothyroidism, unspecified: Secondary | ICD-10-CM | POA: Diagnosis not present

## 2020-05-29 DIAGNOSIS — I872 Venous insufficiency (chronic) (peripheral): Secondary | ICD-10-CM | POA: Diagnosis not present

## 2020-05-29 DIAGNOSIS — Z8673 Personal history of transient ischemic attack (TIA), and cerebral infarction without residual deficits: Secondary | ICD-10-CM | POA: Diagnosis not present

## 2020-05-29 DIAGNOSIS — E785 Hyperlipidemia, unspecified: Secondary | ICD-10-CM | POA: Diagnosis not present

## 2020-05-31 DIAGNOSIS — M25562 Pain in left knee: Secondary | ICD-10-CM | POA: Diagnosis not present

## 2020-05-31 DIAGNOSIS — M25561 Pain in right knee: Secondary | ICD-10-CM | POA: Diagnosis not present

## 2020-05-31 DIAGNOSIS — M1711 Unilateral primary osteoarthritis, right knee: Secondary | ICD-10-CM | POA: Diagnosis not present

## 2020-05-31 DIAGNOSIS — M170 Bilateral primary osteoarthritis of knee: Secondary | ICD-10-CM | POA: Diagnosis not present

## 2020-06-13 NOTE — H&P (Signed)
TOTAL KNEE ADMISSION H&P  Patient is being admitted for left total knee arthroplasty.  Subjective:  Chief Complaint:  Left knee OA / pain  HPI: Donna Petty, 84 y.o. female, has a history of pain and functional disability in the left knee due to arthritis and has failed non-surgical conservative treatments for greater than 12 weeks to include NSAID's and/or analgesics, corticosteriod injections, use of assistive devices and activity modification.  Onset of symptoms was gradual, starting years ago with gradually worsening course since that time. The patient noted no past surgery on the left knee.  Patient currently rates pain in the left knee at 9 out of 10 with activity. Patient has worsening of pain with activity and weight bearing, pain that interferes with activities of daily living, pain with passive range of motion, crepitus and joint swelling.  Patient has evidence of periarticular osteophytes and joint space narrowing by imaging studies. There is no active infection.  Risks, benefits and expectations were discussed with the patient.  Risks including but not limited to the risk of anesthesia, blood clots, nerve damage, blood vessel damage, failure of the prosthesis, infection and up to and including death.  Patient understand the risks, benefits and expectations and wishes to proceed with surgery.   PCP: Jani Gravel, MD  D/C Plans:       Home   Post-op Meds:       No Rx given  Tranexamic Acid:      To be given - IV   Decadron:      Is to be given  FYI:      ASA  [place]  DME:   Pt equipment arranged  PT:   OPPT @ [facility]  Pharmacy: [place], [place], [place]   Patient Active Problem List   Diagnosis Date Noted  . Left-sided carotid artery disease ([place]) 04/27/2014   Past Medical History:  Diagnosis Date  . Acid reflux   . Carotid artery disease    50-70% left internal carotid artery stenosis  . Thyroid condition   . Visual changes     No past surgical  history on file.   No current facility-administered medications for this encounter.   Current Outpatient Medications  Medication Sig Dispense Refill Last Dose  . Calcium-Vitamin D 600-200 MG-UNIT per tablet Take 1 tablet by mouth daily.     Marland Kitchen glucosamine-chondroitin 500-400 MG tablet Take 1 tablet by mouth daily.     Marland Kitchen ibuprofen (ADVIL,MOTRIN) 200 MG tablet Take 200 mg by mouth as needed.     Marland Kitchen levothyroxine (SYNTHROID, LEVOTHROID) 25 MCG tablet Take 25 mcg by mouth daily before breakfast.      . Multiple Vitamins-Minerals (ICAPS PO) Take by mouth daily.     Marland Kitchen omeprazole (PRILOSEC) 40 MG capsule       No Known Allergies   Social History   Tobacco Use  . Smoking status: Never Smoker  Substance Use Topics  . Alcohol use: No       Review of Systems  Constitutional: Negative.   HENT: Negative.   Eyes: Negative.   Respiratory: Negative.   Cardiovascular: Negative.   Gastrointestinal: Positive for heartburn.  Genitourinary: Negative.   Musculoskeletal: Positive for joint pain.  Skin: Negative.   Neurological: Negative.   Endo/Heme/Allergies: Negative.   Psychiatric/Behavioral: Negative.      Objective:  Physical Exam Constitutional:      Appearance: She is well-developed.  HENT:     Head: Normocephalic.  Eyes:  Pupils: Pupils are equal, round, and reactive to light.  Neck:     Thyroid: No thyromegaly.     Vascular: No JVD.     Trachea: No tracheal deviation.  Cardiovascular:     Rate and Rhythm: Normal rate and regular rhythm.  Pulmonary:     Effort: Pulmonary effort is normal. No respiratory distress.     Breath sounds: Normal breath sounds. No wheezing.  Abdominal:     Palpations: Abdomen is soft.     Tenderness: There is no abdominal tenderness. There is no guarding.  Musculoskeletal:     Cervical back: Neck supple.     Left knee: Swelling and bony tenderness present. No erythema or ecchymosis. Decreased range of motion. Tenderness present.   Lymphadenopathy:     Cervical: No cervical adenopathy.  Skin:    General: Skin is warm and dry.  Neurological:     Mental Status: She is alert and oriented to person, place, and time.       Labs:  Estimated body mass index is 29.48 kg/m as calculated from the following:   Height as of 10/25/14: 5\' 1"  (1.549 m).   Weight as of 10/25/14: 70.8 kg.   Imaging Review Plain radiographs demonstrate severe degenerative joint disease of the left knee. The bone quality appears to be good for age and reported activity level.      Assessment/Plan:  End stage arthritis, left knee   The patient history, physical examination, clinical judgment of the provider and imaging studies are consistent with end stage degenerative joint disease of the left knee and total knee arthroplasty is deemed medically necessary. The treatment options including medical management, injection therapy arthroscopy and arthroplasty were discussed at length. The risks and benefits of total knee arthroplasty were presented and reviewed. The risks due to aseptic loosening, infection, stiffness, patella tracking problems, thromboembolic complications and other imponderables were discussed. The patient acknowledged the explanation, agreed to proceed with the plan and consent was signed. Patient is being admitted for treatment for surgery, pain control, PT, OT, prophylactic antibiotics, VTE prophylaxis, progressive ambulation and ADL's and discharge planning. The patient is planning to be discharged home.     Patient's anticipated LOS is less than 2 midnights, meeting these requirements: - Lives within 1 hour of care - Has a competent adult at home to recover with post-op recover - NO history of  - Chronic pain requiring opiods  - Diabetes  - Heart failure  - Heart attack  - Stroke  - DVT/VTE  - Cardiac arrhythmia  - Respiratory Failure/COPD  - Renal failure  - Anemia  - Advanced Liver disease      [place].  Jyssica Rief   PA-C  06/13/2020, 10:19 AM

## 2020-06-15 NOTE — Patient Instructions (Signed)
DUE TO COVID-19 ONLY ONE VISITOR IS ALLOWED TO COME WITH YOU AND STAY IN THE WAITING ROOM ONLY DURING PRE OP AND PROCEDURE DAY OF SURGERY. THE 1 VISITOR  MAY VISIT WITH YOU AFTER SURGERY IN YOUR PRIVATE ROOM DURING VISITING HOURS ONLY!  YOU NEED TO HAVE A COVID 19 TEST ON: 06/22/20@           , THIS TEST MUST BE DONE BEFORE SURGERY,  COVID TESTING SITE [address] [place] [address], IT IS ON THE RIGHT GOING OUT [address] APPROXIMATELY  2 MINUTES PAST [employer] ON THE RIGHT. ONCE YOUR COVID TEST IS COMPLETED,  PLEASE BEGIN THE QUARANTINE INSTRUCTIONS AS OUTLINED IN YOUR HANDOUT.                Donna Petty    Your procedure is scheduled on: 06/26/20   Report to [facility] Main  Entrance   Report to admitting at: 6:30 AM     Call this number if you have problems the morning of surgery [phone]    Remember: NO SOLID FOOD AFTER MIDNIGHT THE NIGHT PRIOR TO SURGERY. NOTHING BY MOUTH EXCEPT CLEAR LIQUIDS UNTIL: 6:00 AM . PLEASE FINISH ENSURE DRINK PER SURGEON ORDER  WHICH NEEDS TO BE COMPLETED AT: 6:30 AM .  CLEAR LIQUID DIET   Foods Allowed                                                                     Foods Excluded  Coffee and tea, regular and decaf                             liquids that you cannot  Plain Jell-O any favor except red or purple                                           see through such as: Fruit ices (not with fruit pulp)                                     milk, soups, orange juice  Iced Popsicles                                    All solid food Carbonated beverages, regular and diet                                    Cranberry, grape and apple juices Sports drinks like Gatorade Lightly seasoned clear broth or consume(fat free) Sugar, honey syrup  Sample Menu Breakfast                                Lunch  Supper Cranberry juice                    Beef broth                             Chicken broth Jell-O                                     Grape juice                           Apple juice Coffee or tea                        Jell-O                                      Popsicle                                                Coffee or tea                        Coffee or tea  _____________________________________________________________________   BRUSH YOUR TEETH MORNING OF SURGERY AND RINSE YOUR MOUTH OUT, NO CHEWING GUM CANDY OR MINTS.     Take these medicines the morning of surgery with A SIP OF WATER: cetirizine(zyrtec),synthroid,omeprazole.                               You may not have any metal on your body including hair pins and              piercings  Do not wear jewelry, make-up, lotions, powders or perfumes, deodorant             Do not wear nail polish on your fingernails.  Do not shave  48 hours prior to surgery.    Do not bring valuables to the hospital. [place].  Contacts, dentures or bridgework may not be worn into surgery.  Leave suitcase in the car. After surgery it may be brought to your room.     Patients discharged the day of surgery will not be allowed to drive home. IF YOU ARE HAVING SURGERY AND GOING HOME THE SAME DAY, YOU MUST HAVE AN ADULT TO DRIVE YOU HOME AND BE WITH YOU FOR 24 HOURS. YOU MAY GO HOME BY TAXI OR UBER OR ORTHERWISE, BUT AN ADULT MUST ACCOMPANY YOU HOME AND STAY WITH YOU FOR 24 HOURS.  Name and phone number of your driver:  Special Instructions: N/A              Please read over the following fact sheets you were given: _____________________________________________________________________        [facility] - Preparing for Surgery Before surgery, you can play an important role.  Because skin is not sterile, your skin needs to be as free of germs as possible.  You can  reduce the number of germs on your skin by washing with CHG (chlorahexidine gluconate) soap before surgery.   CHG is an antiseptic cleaner which kills germs and bonds with the skin to continue killing germs even after washing. Please DO NOT use if you have an allergy to CHG or antibacterial soaps.  If your skin becomes reddened/irritated stop using the CHG and inform your nurse when you arrive at Short Stay. Do not shave (including legs and underarms) for at least 48 hours prior to the first CHG shower.  You may shave your face/neck. Please follow these instructions carefully:  1.  Shower with CHG Soap the night before surgery and the  morning of Surgery.  2.  If you choose to wash your hair, wash your hair first as usual with your  normal  shampoo.  3.  After you shampoo, rinse your hair and body thoroughly to remove the  shampoo.                           4.  Use CHG as you would any other liquid soap.  You can apply chg directly  to the skin and wash                       Gently with a scrungie or clean washcloth.  5.  Apply the CHG Soap to your body ONLY FROM THE NECK DOWN.   Do not use on face/ open                           Wound or open sores. Avoid contact with eyes, ears mouth and genitals (private parts).                       Wash face,  Genitals (private parts) with your normal soap.             6.  Wash thoroughly, paying special attention to the area where your surgery  will be performed.  7.  Thoroughly rinse your body with warm water from the neck down.  8.  DO NOT shower/wash with your normal soap after using and rinsing off  the CHG Soap.                9.  Pat yourself dry with a clean towel.            10.  Wear clean pajamas.            11.  Place clean sheets on your bed the night of your first shower and do not  sleep with pets. Day of Surgery : Do not apply any lotions/deodorants the morning of surgery.  Please wear clean clothes to the hospital/surgery center.  FAILURE TO FOLLOW THESE INSTRUCTIONS MAY RESULT IN THE CANCELLATION OF YOUR SURGERY PATIENT  SIGNATURE_________________________________  NURSE SIGNATURE__________________________________  ________________________________________________________________________   Donna Petty  An incentive spirometer is a tool that can help keep your lungs clear and active. This tool measures how well you are filling your lungs with each breath. Taking long deep breaths may help reverse or decrease the chance of developing breathing (pulmonary) problems (especially infection) following:  A long period of time when you are unable to move or be active. BEFORE THE PROCEDURE   If the spirometer includes an indicator to show your best effort, your nurse or respiratory therapist will set it  to a desired goal.  If possible, sit up straight or lean slightly forward. Try not to slouch.  Hold the incentive spirometer in an upright position. INSTRUCTIONS FOR USE  1. Sit on the edge of your bed if possible, or sit up as far as you can in bed or on a chair. 2. Hold the incentive spirometer in an upright position. 3. Breathe out normally. 4. Place the mouthpiece in your mouth and seal your lips tightly around it. 5. Breathe in slowly and as deeply as possible, raising the piston or the ball toward the top of the column. 6. Hold your breath for 3-5 seconds or for as long as possible. Allow the piston or ball to fall to the bottom of the column. 7. Remove the mouthpiece from your mouth and breathe out normally. 8. Rest for a few seconds and repeat Steps 1 through 7 at least 10 times every 1-2 hours when you are awake. Take your time and take a few normal breaths between deep breaths. 9. The spirometer may include an indicator to show your best effort. Use the indicator as a goal to work toward during each repetition. 10. After each set of 10 deep breaths, practice coughing to be sure your lungs are clear. If you have an incision (the cut made at the time of surgery), support your incision when coughing  by placing a pillow or rolled up towels firmly against it. Once you are able to get out of bed, walk around indoors and cough well. You may stop using the incentive spirometer when instructed by your caregiver.  RISKS AND COMPLICATIONS  Take your time so you do not get dizzy or light-headed.  If you are in pain, you may need to take or ask for pain medication before doing incentive spirometry. It is harder to take a deep breath if you are having pain. AFTER USE  Rest and breathe slowly and easily.  It can be helpful to keep track of a log of your progress. Your caregiver can provide you with a simple table to help with this. If you are using the spirometer at home, follow these instructions: [place] IF:   You are having difficultly using the spirometer.  You have trouble using the spirometer as often as instructed.  Your pain medication is not giving enough relief while using the spirometer.  You develop fever of 100.5 F (38.1 C) or higher. SEEK IMMEDIATE MEDICAL CARE IF:   You cough up bloody sputum that had not been present before.  You develop fever of 102 F (38.9 C) or greater.  You develop worsening pain at or near the incision site. MAKE SURE YOU:   Understand these instructions.  Will watch your condition.  Will get help right away if you are not doing well or get worse. Document Released: 12/01/2006 Document Revised: 10/13/2011 Document Reviewed: 02/01/2007 [facility] Patient Information 2014 [place], [place].   ________________________________________________________________________

## 2020-06-18 ENCOUNTER — Encounter (HOSPITAL_COMMUNITY): Payer: Self-pay

## 2020-06-18 ENCOUNTER — Encounter (HOSPITAL_COMMUNITY)
Admission: RE | Admit: 2020-06-18 | Discharge: 2020-06-18 | Disposition: A | Discharge status: Home / Self Care | Payer: Medicare PPO | Source: Ambulatory Visit | Attending: Orthopedic Surgery | Admitting: Orthopedic Surgery

## 2020-06-18 ENCOUNTER — Other Ambulatory Visit: Payer: Self-pay

## 2020-06-18 DIAGNOSIS — Z01818 Encounter for other preprocedural examination: Secondary | ICD-10-CM | POA: Insufficient documentation

## 2020-06-18 HISTORY — DX: Unspecified osteoarthritis, unspecified site: M19.90

## 2020-06-18 HISTORY — DX: Atherosclerotic heart disease of native coronary artery without angina pectoris: I25.10

## 2020-06-18 HISTORY — DX: Hypothyroidism, unspecified: E03.9

## 2020-06-18 LAB — BASIC METABOLIC PANEL
Anion gap: 9 (ref 5–15)
BUN: 14 mg/dL (ref 8–23)
CO2: 26 mmol/L (ref 22–32)
Calcium: 8.9 mg/dL (ref 8.9–10.3)
Chloride: 105 mmol/L (ref 98–111)
Creatinine, Ser: 1.23 mg/dL — ABNORMAL HIGH (ref 0.44–1.00)
GFR, Estimated: 44 mL/min — ABNORMAL LOW (ref >60)
Glucose, Bld: 100 mg/dL — ABNORMAL HIGH (ref 70–99)
Potassium: 4.4 mmol/L (ref 3.5–5.1)
Sodium: 140 mmol/L (ref 135–145)

## 2020-06-18 LAB — CBC
HCT: 41.1 % (ref 36.0–46.0)
Hemoglobin: 13.6 g/dL (ref 12.0–15.0)
MCH: 31.7 pg (ref 26.0–34.0)
MCHC: 33.1 g/dL (ref 30.0–36.0)
MCV: 95.8 fL (ref 80.0–100.0)
Platelets: 288 10*3/uL (ref 150–400)
RBC: 4.29 MIL/uL (ref 3.87–5.11)
RDW: 13.6 % (ref 11.5–15.5)
WBC: 9.5 10*3/uL (ref 4.0–10.5)
nRBC: 0 % (ref 0.0–0.2)

## 2020-06-18 LAB — SURGICAL PCR SCREEN
MRSA, PCR: NEGATIVE
Staphylococcus aureus: POSITIVE — AB

## 2020-06-18 NOTE — Progress Notes (Signed)
COVID Vaccine Completed: Yes Date COVID Vaccine completed: 12/21/19 COVID vaccine manufacturer:  [employer]   PCP - Dr. Jani Gravel. Clearance appointment on: 06/20/20 Cardiologist - Dr. Fulton Reek.  Chest x-ray -  EKG - Requested at [facility]. Stress Test -  ECHO -  Cardiac Cath -  Pacemaker/ICD device last checked:  Sleep Study -  CPAP -   Fasting Blood Sugar -  Checks Blood Sugar _____ times a day  Blood Thinner Instructions: Aspirin Instructions: To hold 7 days before as per surgeon's instructions. Last Dose:  Anesthesia review: Hx: Carotic stenosis.  Patient denies shortness of breath, fever, cough and chest pain at PAT appointment   Patient verbalized understanding of instructions that were given to them at the PAT appointment. Patient was also instructed that they will need to review over the PAT instructions again at home before surgery.

## 2020-06-19 NOTE — Progress Notes (Signed)
PCR: STAPH POSITIVE.

## 2020-06-20 DIAGNOSIS — M1712 Unilateral primary osteoarthritis, left knee: Secondary | ICD-10-CM | POA: Diagnosis not present

## 2020-06-20 DIAGNOSIS — Z01818 Encounter for other preprocedural examination: Secondary | ICD-10-CM | POA: Diagnosis not present

## 2020-06-22 ENCOUNTER — Other Ambulatory Visit (HOSPITAL_COMMUNITY)
Admission: RE | Admit: 2020-06-22 | Discharge: 2020-06-22 | Disposition: A | Discharge status: Home / Self Care | Payer: Medicare PPO | Source: Ambulatory Visit | Attending: Orthopedic Surgery | Admitting: Orthopedic Surgery

## 2020-06-22 DIAGNOSIS — Z01812 Encounter for preprocedural laboratory examination: Secondary | ICD-10-CM | POA: Diagnosis not present

## 2020-06-22 DIAGNOSIS — Z20822 Contact with and (suspected) exposure to covid-19: Secondary | ICD-10-CM | POA: Insufficient documentation

## 2020-06-22 LAB — SARS CORONAVIRUS 2 (TAT 6-24 HRS): SARS Coronavirus 2: NEGATIVE

## 2020-06-26 ENCOUNTER — Encounter (HOSPITAL_COMMUNITY)
Admission: RE | Disposition: A | Discharge status: Home / Self Care | Payer: Self-pay | Source: Ambulatory Visit | Attending: Orthopedic Surgery

## 2020-06-26 ENCOUNTER — Encounter (HOSPITAL_COMMUNITY): Payer: Self-pay | Admitting: Orthopedic Surgery

## 2020-06-26 ENCOUNTER — Observation Stay (HOSPITAL_COMMUNITY)
Admission: RE | Admit: 2020-06-26 | Discharge: 2020-06-27 | Disposition: A | Discharge status: Home / Self Care | Payer: Medicare PPO | Source: Ambulatory Visit | Attending: Orthopedic Surgery | Admitting: Orthopedic Surgery

## 2020-06-26 ENCOUNTER — Ambulatory Visit (HOSPITAL_COMMUNITY): Payer: Medicare PPO | Admitting: Certified Registered Nurse Anesthetist

## 2020-06-26 ENCOUNTER — Other Ambulatory Visit: Payer: Self-pay

## 2020-06-26 DIAGNOSIS — M1712 Unilateral primary osteoarthritis, left knee: Principal | ICD-10-CM | POA: Diagnosis present

## 2020-06-26 DIAGNOSIS — K219 Gastro-esophageal reflux disease without esophagitis: Secondary | ICD-10-CM | POA: Diagnosis not present

## 2020-06-26 DIAGNOSIS — E039 Hypothyroidism, unspecified: Secondary | ICD-10-CM | POA: Diagnosis not present

## 2020-06-26 DIAGNOSIS — M25562 Pain in left knee: Secondary | ICD-10-CM | POA: Diagnosis present

## 2020-06-26 DIAGNOSIS — I2510 Atherosclerotic heart disease of native coronary artery without angina pectoris: Secondary | ICD-10-CM | POA: Diagnosis not present

## 2020-06-26 DIAGNOSIS — G8918 Other acute postprocedural pain: Secondary | ICD-10-CM | POA: Diagnosis not present

## 2020-06-26 DIAGNOSIS — E669 Obesity, unspecified: Secondary | ICD-10-CM | POA: Diagnosis present

## 2020-06-26 DIAGNOSIS — Z96652 Presence of left artificial knee joint: Secondary | ICD-10-CM

## 2020-06-26 HISTORY — PX: TOTAL KNEE ARTHROPLASTY: SHX125

## 2020-06-26 LAB — ABO/RH: ABO/RH(D): A POS

## 2020-06-26 LAB — TYPE AND SCREEN
ABO/RH(D): A POS
Antibody Screen: NEGATIVE

## 2020-06-26 SURGERY — ARTHROPLASTY, KNEE, TOTAL
Anesthesia: Spinal | Site: Knee | Laterality: Left

## 2020-06-26 MED ORDER — METOCLOPRAMIDE HCL 5 MG PO TABS: 5.0000 mg | ORAL_TABLET | Freq: Three times a day (TID) | ORAL | Status: DC | PRN

## 2020-06-26 MED ORDER — BUPIVACAINE-EPINEPHRINE (PF) 0.25% -1:200000 IJ SOLN
INTRAMUSCULAR | Status: AC
  Filled 2020-06-26: qty 30

## 2020-06-26 MED ORDER — DEXAMETHASONE SODIUM PHOSPHATE 10 MG/ML IJ SOLN
INTRAMUSCULAR | Status: AC
  Filled 2020-06-26: qty 1

## 2020-06-26 MED ORDER — MEMANTINE HCL 10 MG PO TABS
5.0000 mg | ORAL_TABLET | Freq: Two times a day (BID) | ORAL | Status: DC
  Administered 2020-06-26 – 2020-06-27 (×2): 5 mg via ORAL
  Filled 2020-06-26 (×2): qty 1

## 2020-06-26 MED ORDER — HYDROMORPHONE HCL 1 MG/ML IJ SOLN: 0.5000 mg | INTRAMUSCULAR | Status: DC | PRN

## 2020-06-26 MED ORDER — MAGNESIUM CITRATE PO SOLN: 1.0000 | Freq: Once | ORAL | Status: DC | PRN

## 2020-06-26 MED ORDER — CEFAZOLIN SODIUM-DEXTROSE 2-4 GM/100ML-% IV SOLN
2.0000 g | INTRAVENOUS | Status: AC
  Administered 2020-06-26: 2 g via INTRAVENOUS
  Filled 2020-06-26: qty 100

## 2020-06-26 MED ORDER — ORAL CARE MOUTH RINSE
15.0000 mL | Freq: Once | OROMUCOSAL | Status: AC
  Administered 2020-06-26: 15 mL via OROMUCOSAL

## 2020-06-26 MED ORDER — SODIUM CHLORIDE (PF) 0.9 % IJ SOLN
INTRAMUSCULAR | Status: DC | PRN
Start: 2020-06-26 — End: 2020-06-26
  Administered 2020-06-26: 30 mL

## 2020-06-26 MED ORDER — POLYETHYLENE GLYCOL 3350 17 G PO PACK
17.0000 g | PACK | Freq: Two times a day (BID) | ORAL | Status: DC
  Administered 2020-06-26: 17 g via ORAL
  Filled 2020-06-26 (×2): qty 1

## 2020-06-26 MED ORDER — ROPIVACAINE HCL 5 MG/ML IJ SOLN
INTRAMUSCULAR | Status: DC | PRN
  Administered 2020-06-26: 30 mL via PERINEURAL

## 2020-06-26 MED ORDER — DEXAMETHASONE SODIUM PHOSPHATE 10 MG/ML IJ SOLN
10.0000 mg | Freq: Once | INTRAMUSCULAR | Status: AC
  Administered 2020-06-27: 10 mg via INTRAVENOUS
  Filled 2020-06-26: qty 1

## 2020-06-26 MED ORDER — STERILE WATER FOR IRRIGATION IR SOLN
Status: DC | PRN
  Administered 2020-06-26: 2000 mL

## 2020-06-26 MED ORDER — PHENOL 1.4 % MT LIQD: 1.0000 | OROMUCOSAL | Status: DC | PRN

## 2020-06-26 MED ORDER — DEXAMETHASONE SODIUM PHOSPHATE 10 MG/ML IJ SOLN
10.0000 mg | Freq: Once | INTRAMUSCULAR | Status: AC
  Administered 2020-06-26: 7 mg via INTRAVENOUS

## 2020-06-26 MED ORDER — EPHEDRINE 5 MG/ML INJ
INTRAVENOUS | Status: AC
  Filled 2020-06-26: qty 10

## 2020-06-26 MED ORDER — NON FORMULARY: 40.0000 mg | Freq: Every day | Status: DC

## 2020-06-26 MED ORDER — PROPOFOL 10 MG/ML IV BOLUS
INTRAVENOUS | Status: AC
  Filled 2020-06-26: qty 40

## 2020-06-26 MED ORDER — ONDANSETRON HCL 4 MG PO TABS: 4.0000 mg | ORAL_TABLET | Freq: Four times a day (QID) | ORAL | Status: DC | PRN

## 2020-06-26 MED ORDER — OXYCODONE HCL 5 MG PO TABS: 5.0000 mg | ORAL_TABLET | Freq: Once | ORAL | Status: DC | PRN

## 2020-06-26 MED ORDER — FUROSEMIDE 40 MG PO TABS
40.0000 mg | ORAL_TABLET | Freq: Every day | ORAL | Status: DC
  Administered 2020-06-27: 40 mg via ORAL
  Filled 2020-06-26: qty 1

## 2020-06-26 MED ORDER — TRANEXAMIC ACID-NACL 1000-0.7 MG/100ML-% IV SOLN
1000.0000 mg | INTRAVENOUS | Status: AC
  Administered 2020-06-26: 1000 mg via INTRAVENOUS
  Filled 2020-06-26: qty 100

## 2020-06-26 MED ORDER — CHLORHEXIDINE GLUCONATE 0.12 % MT SOLN: 15.0000 mL | Freq: Once | OROMUCOSAL | Status: AC

## 2020-06-26 MED ORDER — ONDANSETRON HCL 4 MG/2ML IJ SOLN: 4.0000 mg | Freq: Once | INTRAMUSCULAR | Status: DC | PRN

## 2020-06-26 MED ORDER — KETOROLAC TROMETHAMINE 30 MG/ML IJ SOLN
INTRAMUSCULAR | Status: AC
  Filled 2020-06-26: qty 1

## 2020-06-26 MED ORDER — DOCUSATE SODIUM 100 MG PO CAPS
100.0000 mg | ORAL_CAPSULE | Freq: Two times a day (BID) | ORAL | Status: DC
  Administered 2020-06-26 – 2020-06-27 (×2): 100 mg via ORAL
  Filled 2020-06-26 (×2): qty 1

## 2020-06-26 MED ORDER — HYDROCODONE-ACETAMINOPHEN 5-325 MG PO TABS
1.0000 | ORAL_TABLET | ORAL | Status: DC | PRN
  Administered 2020-06-26: 1 via ORAL
  Administered 2020-06-27 (×2): 2 via ORAL
  Filled 2020-06-26 (×2): qty 2
  Filled 2020-06-26: qty 1

## 2020-06-26 MED ORDER — SODIUM CHLORIDE 0.9 % IR SOLN
Status: DC | PRN
Start: 2020-06-26 — End: 2020-06-26
  Administered 2020-06-26: 1000 mL

## 2020-06-26 MED ORDER — EPHEDRINE SULFATE-NACL 50-0.9 MG/10ML-% IV SOSY
PREFILLED_SYRINGE | INTRAVENOUS | Status: DC | PRN
  Administered 2020-06-26: 10 mg via INTRAVENOUS
  Administered 2020-06-26: 5 mg via INTRAVENOUS
  Administered 2020-06-26: 10 mg via INTRAVENOUS

## 2020-06-26 MED ORDER — POVIDONE-IODINE 10 % EX SWAB
2.0000 "application " | Freq: Once | CUTANEOUS | Status: AC
  Administered 2020-06-26: 2 via TOPICAL

## 2020-06-26 MED ORDER — ACETAMINOPHEN 325 MG PO TABS: 325.0000 mg | ORAL_TABLET | Freq: Four times a day (QID) | ORAL | Status: DC | PRN

## 2020-06-26 MED ORDER — ONDANSETRON HCL 4 MG/2ML IJ SOLN
INTRAMUSCULAR | Status: AC
  Filled 2020-06-26: qty 2

## 2020-06-26 MED ORDER — METOCLOPRAMIDE HCL 5 MG/ML IJ SOLN: 5.0000 mg | Freq: Three times a day (TID) | INTRAMUSCULAR | Status: DC | PRN

## 2020-06-26 MED ORDER — AMISULPRIDE (ANTIEMETIC) 5 MG/2ML IV SOLN: 10.0000 mg | Freq: Once | INTRAVENOUS | Status: DC | PRN

## 2020-06-26 MED ORDER — CEFAZOLIN SODIUM-DEXTROSE 2-4 GM/100ML-% IV SOLN
2.0000 g | Freq: Four times a day (QID) | INTRAVENOUS | Status: AC
  Administered 2020-06-26 (×2): 2 g via INTRAVENOUS
  Filled 2020-06-26 (×2): qty 100

## 2020-06-26 MED ORDER — LORATADINE 10 MG PO TABS: 10.0000 mg | ORAL_TABLET | Freq: Every day | ORAL | Status: DC | PRN

## 2020-06-26 MED ORDER — PHENYLEPHRINE 40 MCG/ML (10ML) SYRINGE FOR IV PUSH (FOR BLOOD PRESSURE SUPPORT)
PREFILLED_SYRINGE | INTRAVENOUS | Status: AC
  Filled 2020-06-26: qty 10

## 2020-06-26 MED ORDER — FERROUS SULFATE 325 (65 FE) MG PO TABS
325.0000 mg | ORAL_TABLET | Freq: Two times a day (BID) | ORAL | Status: DC
  Administered 2020-06-26 – 2020-06-27 (×2): 325 mg via ORAL
  Filled 2020-06-26 (×2): qty 1

## 2020-06-26 MED ORDER — SODIUM CHLORIDE 0.9 % IV SOLN: INTRAVENOUS | Status: DC

## 2020-06-26 MED ORDER — PROPOFOL 500 MG/50ML IV EMUL
INTRAVENOUS | Status: AC
  Filled 2020-06-26: qty 50

## 2020-06-26 MED ORDER — POTASSIUM CHLORIDE CRYS ER 10 MEQ PO TBCR
10.0000 meq | EXTENDED_RELEASE_TABLET | Freq: Every evening | ORAL | Status: DC
  Administered 2020-06-26: 10 meq via ORAL
  Filled 2020-06-26: qty 1

## 2020-06-26 MED ORDER — CELECOXIB 200 MG PO CAPS
200.0000 mg | ORAL_CAPSULE | Freq: Two times a day (BID) | ORAL | Status: DC
  Administered 2020-06-26 – 2020-06-27 (×2): 200 mg via ORAL
  Filled 2020-06-26 (×2): qty 1

## 2020-06-26 MED ORDER — METHOCARBAMOL 500 MG IVPB - SIMPLE MED
500.0000 mg | Freq: Four times a day (QID) | INTRAVENOUS | Status: DC | PRN
  Filled 2020-06-26: qty 50

## 2020-06-26 MED ORDER — OMEPRAZOLE 20 MG PO CPDR
40.0000 mg | DELAYED_RELEASE_CAPSULE | Freq: Every day | ORAL | Status: DC
  Administered 2020-06-27: 40 mg via ORAL
  Filled 2020-06-26: qty 2

## 2020-06-26 MED ORDER — BUPIVACAINE IN DEXTROSE 0.75-8.25 % IT SOLN
INTRATHECAL | Status: DC | PRN
  Administered 2020-06-26: 1.6 mL via INTRATHECAL

## 2020-06-26 MED ORDER — ASPIRIN 81 MG PO CHEW
81.0000 mg | CHEWABLE_TABLET | Freq: Two times a day (BID) | ORAL | Status: DC
  Administered 2020-06-26 – 2020-06-27 (×2): 81 mg via ORAL
  Filled 2020-06-26 (×2): qty 1

## 2020-06-26 MED ORDER — PHENYLEPHRINE 40 MCG/ML (10ML) SYRINGE FOR IV PUSH (FOR BLOOD PRESSURE SUPPORT)
PREFILLED_SYRINGE | INTRAVENOUS | Status: DC | PRN
  Administered 2020-06-26 (×5): 40 ug via INTRAVENOUS

## 2020-06-26 MED ORDER — SODIUM CHLORIDE (PF) 0.9 % IJ SOLN
INTRAMUSCULAR | Status: AC
  Filled 2020-06-26: qty 50

## 2020-06-26 MED ORDER — DIPHENHYDRAMINE HCL 12.5 MG/5ML PO ELIX: 12.5000 mg | ORAL_SOLUTION | ORAL | Status: DC | PRN

## 2020-06-26 MED ORDER — CALCIUM CARBONATE ANTACID 500 MG PO CHEW: 3.0000 | CHEWABLE_TABLET | Freq: Every day | ORAL | Status: DC | PRN

## 2020-06-26 MED ORDER — BUPIVACAINE-EPINEPHRINE (PF) 0.25% -1:200000 IJ SOLN
INTRAMUSCULAR | Status: DC | PRN
  Administered 2020-06-26: 30 mL

## 2020-06-26 MED ORDER — FENTANYL CITRATE (PF) 100 MCG/2ML IJ SOLN
50.0000 ug | INTRAMUSCULAR | Status: DC
  Administered 2020-06-26: 50 ug via INTRAVENOUS

## 2020-06-26 MED ORDER — SODIUM CHLORIDE 0.9 % IR SOLN
Status: DC | PRN
  Administered 2020-06-26: 1000 mL

## 2020-06-26 MED ORDER — FENTANYL CITRATE (PF) 100 MCG/2ML IJ SOLN: 25.0000 ug | INTRAMUSCULAR | Status: DC | PRN

## 2020-06-26 MED ORDER — TRANEXAMIC ACID-NACL 1000-0.7 MG/100ML-% IV SOLN
1000.0000 mg | Freq: Once | INTRAVENOUS | Status: AC
  Administered 2020-06-26: 1000 mg via INTRAVENOUS
  Filled 2020-06-26: qty 100

## 2020-06-26 MED ORDER — METHOCARBAMOL 500 MG PO TABS
500.0000 mg | ORAL_TABLET | Freq: Four times a day (QID) | ORAL | Status: DC | PRN
  Administered 2020-06-26 – 2020-06-27 (×2): 500 mg via ORAL
  Filled 2020-06-26 (×2): qty 1

## 2020-06-26 MED ORDER — BISACODYL 10 MG RE SUPP: 10.0000 mg | Freq: Every day | RECTAL | Status: DC | PRN

## 2020-06-26 MED ORDER — ALUM & MAG HYDROXIDE-SIMETH 200-200-20 MG/5ML PO SUSP: 15.0000 mL | ORAL | Status: DC | PRN

## 2020-06-26 MED ORDER — LACTATED RINGERS IV SOLN: INTRAVENOUS | Status: DC

## 2020-06-26 MED ORDER — SIMVASTATIN 20 MG PO TABS: 10.0000 mg | ORAL_TABLET | ORAL | Status: DC

## 2020-06-26 MED ORDER — PROPOFOL 500 MG/50ML IV EMUL
INTRAVENOUS | Status: DC | PRN
  Administered 2020-06-26: 50 ug/kg/min via INTRAVENOUS

## 2020-06-26 MED ORDER — FENTANYL CITRATE (PF) 100 MCG/2ML IJ SOLN
INTRAMUSCULAR | Status: AC
  Filled 2020-06-26: qty 2

## 2020-06-26 MED ORDER — KETOROLAC TROMETHAMINE 30 MG/ML IJ SOLN
INTRAMUSCULAR | Status: DC | PRN
  Administered 2020-06-26: 30 mg

## 2020-06-26 MED ORDER — PROPOFOL 10 MG/ML IV BOLUS
INTRAVENOUS | Status: DC | PRN
  Administered 2020-06-26 (×2): 20 mg via INTRAVENOUS

## 2020-06-26 MED ORDER — LEVOTHYROXINE SODIUM 75 MCG PO TABS
75.0000 ug | ORAL_TABLET | Freq: Every day | ORAL | Status: DC
  Administered 2020-06-27: 75 ug via ORAL
  Filled 2020-06-26: qty 1

## 2020-06-26 MED ORDER — OXYCODONE HCL 5 MG/5ML PO SOLN: 5.0000 mg | Freq: Once | ORAL | Status: DC | PRN

## 2020-06-26 MED ORDER — ONDANSETRON HCL 4 MG/2ML IJ SOLN
INTRAMUSCULAR | Status: DC | PRN
  Administered 2020-06-26: 4 mg via INTRAVENOUS

## 2020-06-26 MED ORDER — ONDANSETRON HCL 4 MG/2ML IJ SOLN: 4.0000 mg | Freq: Four times a day (QID) | INTRAMUSCULAR | Status: DC | PRN

## 2020-06-26 MED ORDER — MENTHOL 3 MG MT LOZG: 1.0000 | LOZENGE | OROMUCOSAL | Status: DC | PRN

## 2020-06-26 MED ORDER — HYDROCODONE-ACETAMINOPHEN 7.5-325 MG PO TABS: 1.0000 | ORAL_TABLET | ORAL | Status: DC | PRN

## 2020-06-26 SURGICAL SUPPLY — 64 items
ADH SKN CLS APL DERMABOND .7 (GAUZE/BANDAGES/DRESSINGS) ×1
ATTUNE MED ANAT PAT 32 KNEE (Knees) ×1 IMPLANT
ATTUNE MED ANAT PAT 32MM KNEE (Knees) ×1 IMPLANT
ATTUNE PSFEM LTSZ3 NARCEM KNEE (Femur) ×2 IMPLANT
ATTUNE PSRP INSR SZ3 5 KNEE (Insert) ×1 IMPLANT
ATTUNE PSRP INSR SZ3 5MM KNEE (Insert) ×1 IMPLANT
BAG SPEC THK2 15X12 ZIP CLS (MISCELLANEOUS)
BAG ZIPLOCK 12X15 (MISCELLANEOUS) IMPLANT
BASE TIBIAL ROT PLAT SZ 3 KNEE (Knees) IMPLANT
BLADE SAW SGTL 11.0X1.19X90.0M (BLADE) ×2 IMPLANT
BLADE SAW SGTL 13.0X1.19X90.0M (BLADE) ×3 IMPLANT
BLADE SURG SZ10 CARB STEEL (BLADE) ×6 IMPLANT
BNDG ELASTIC 6X5.8 VLCR STR LF (GAUZE/BANDAGES/DRESSINGS) ×3 IMPLANT
BOWL SMART MIX CTS (DISPOSABLE) ×3 IMPLANT
BSPLAT TIB 3 CMNT ROT PLAT STR (Knees) ×1 IMPLANT
CEMENT HV SMART SET (Cement) ×4 IMPLANT
COVER SURGICAL LIGHT HANDLE (MISCELLANEOUS) ×3 IMPLANT
COVER WAND RF STERILE (DRAPES) ×2 IMPLANT
CUFF TOURN SGL QUICK 34 (TOURNIQUET CUFF) ×3
CUFF TRNQT CYL 34X4.125X (TOURNIQUET CUFF) ×1 IMPLANT
DECANTER SPIKE VIAL GLASS SM (MISCELLANEOUS) ×6 IMPLANT
DERMABOND ADVANCED (GAUZE/BANDAGES/DRESSINGS) ×2
DERMABOND ADVANCED .7 DNX12 (GAUZE/BANDAGES/DRESSINGS) ×1 IMPLANT
DRAPE U-SHAPE 47X51 STRL (DRAPES) ×3 IMPLANT
DRESSING AQUACEL AG SP 3.5X10 (GAUZE/BANDAGES/DRESSINGS) ×1 IMPLANT
DRSG AQUACEL AG SP 3.5X10 (GAUZE/BANDAGES/DRESSINGS) ×3
DURAPREP 26ML APPLICATOR (WOUND CARE) ×6 IMPLANT
ELECT REM PT RETURN 15FT ADLT (MISCELLANEOUS) ×3 IMPLANT
GLOVE BIO SURGEON STRL SZ 6 (GLOVE) ×3 IMPLANT
GLOVE BIOGEL PI IND STRL 6.5 (GLOVE) ×1 IMPLANT
GLOVE BIOGEL PI IND STRL 7.5 (GLOVE) ×1 IMPLANT
GLOVE BIOGEL PI IND STRL 8.5 (GLOVE) ×1 IMPLANT
GLOVE BIOGEL PI INDICATOR 6.5 (GLOVE)
GLOVE BIOGEL PI INDICATOR 7.5 (GLOVE) ×2
GLOVE BIOGEL PI INDICATOR 8.5 (GLOVE) ×2
GLOVE ECLIPSE 8.0 STRL XLNG CF (GLOVE) ×3 IMPLANT
GLOVE ORTHO TXT STRL SZ7.5 (GLOVE) ×3 IMPLANT
GOWN STRL REUS W/ TWL LRG LVL3 (GOWN DISPOSABLE) ×1 IMPLANT
GOWN STRL REUS W/TWL 2XL LVL3 (GOWN DISPOSABLE) ×3 IMPLANT
GOWN STRL REUS W/TWL LRG LVL3 (GOWN DISPOSABLE) ×6 IMPLANT
HANDPIECE INTERPULSE COAX TIP (DISPOSABLE) ×3
HOLDER FOLEY CATH W/STRAP (MISCELLANEOUS) IMPLANT
KIT TURNOVER KIT A (KITS) IMPLANT
MANIFOLD NEPTUNE II (INSTRUMENTS) ×3 IMPLANT
NDL SAFETY ECLIPSE 18X1.5 (NEEDLE) IMPLANT
NEEDLE HYPO 18GX1.5 SHARP (NEEDLE)
NS IRRIG 1000ML POUR BTL (IV SOLUTION) ×3 IMPLANT
PACK TOTAL KNEE CUSTOM (KITS) ×3 IMPLANT
PENCIL SMOKE EVACUATOR (MISCELLANEOUS) IMPLANT
PIN DRILL FIX HALF THREAD (BIT) ×2 IMPLANT
PIN FIX SIGMA LCS THRD HI (PIN) ×2 IMPLANT
PROTECTOR NERVE ULNAR (MISCELLANEOUS) ×3 IMPLANT
SET HNDPC FAN SPRY TIP SCT (DISPOSABLE) ×1 IMPLANT
SET PAD KNEE POSITIONER (MISCELLANEOUS) ×3 IMPLANT
SUT MNCRL AB 4-0 PS2 18 (SUTURE) ×3 IMPLANT
SUT STRATAFIX PDS+ 0 24IN (SUTURE) ×3 IMPLANT
SUT VIC AB 1 CT1 36 (SUTURE) ×3 IMPLANT
SUT VIC AB 2-0 CT1 27 (SUTURE) ×9
SUT VIC AB 2-0 CT1 TAPERPNT 27 (SUTURE) ×3 IMPLANT
SYR 3ML LL SCALE MARK (SYRINGE) ×3 IMPLANT
TIBIAL BASE ROT PLAT SZ 3 KNEE (Knees) ×3 IMPLANT
TRAY FOLEY MTR SLVR 16FR STAT (SET/KITS/TRAYS/PACK) ×3 IMPLANT
WATER STERILE IRR 1000ML POUR (IV SOLUTION) ×6 IMPLANT
WRAP KNEE MAXI GEL POST OP (GAUZE/BANDAGES/DRESSINGS) ×3 IMPLANT

## 2020-06-26 NOTE — Care Plan (Signed)
Ortho Bundle Case Management Note  Patient Details  Name: Donna Petty MRN: [id number] Date of Birth: May 20, 1936  L TKA on 06-26-20 DCP:  Home with dtr.  1 story home with 6 ste. DME:  Has a RW.  3-in-1 ordered through [facility]. PT:  [facility].  PT eval scheduled for 07-02-20 at 2:30 pm.                    DME Arranged:  3-N-1 DME Agency:  [facility]  HH Arranged:  NA HH Agency:  NA  Additional Comments: Please contact me with any questions of if this plan should need to change.  Marianne Sofia, RN,CCM [facility]  [phone] 06/26/2020, 3:39 PM

## 2020-06-26 NOTE — Discharge Instructions (Signed)

## 2020-06-26 NOTE — Anesthesia Procedure Notes (Signed)
Procedure Name: [place] Performed by: West Pugh, CRNA Pre-anesthesia Checklist: Patient identified, Emergency Drugs available, Suction available, Patient being monitored and Timeout performed Patient Re-evaluated:Patient Re-evaluated prior to induction Oxygen Delivery Method: Simple face mask Preoxygenation: Pre-oxygenation with 100% oxygen Induction Type: IV induction Placement Confirmation: positive ETCO2 Dental Injury: Teeth and Oropharynx as per pre-operative assessment

## 2020-06-26 NOTE — Anesthesia Preprocedure Evaluation (Signed)
Anesthesia Evaluation  Patient identified by MRN, date of birth, ID band Patient awake    Reviewed: Allergy & Precautions, NPO status , Patient's Chart, lab work & pertinent test results  History of Anesthesia Complications Negative for: history of anesthetic complications  Airway Mallampati: II  TM Distance: >3 FB Neck ROM: Full    Dental   Pulmonary neg pulmonary ROS,    Pulmonary exam normal        Cardiovascular + CAD  Normal cardiovascular exam     Neuro/Psych Carotid artery stenosis negative psych ROS   GI/Hepatic Neg liver ROS, GERD  ,  Endo/Other  Hypothyroidism   Renal/GU Renal disease (Cr 1.23)  negative genitourinary   Musculoskeletal  (+) Arthritis ,   Abdominal   Peds  Hematology negative hematology ROS (+)   Anesthesia Other Findings   Reproductive/Obstetrics                            Anesthesia Physical Anesthesia Plan  ASA: III  Anesthesia Plan: Spinal   Post-op Pain Management:    Induction:   PONV Risk Score and Plan: 2 and Propofol infusion, Treatment may vary due to age or medical condition, Ondansetron and TIVA  Airway Management Planned: Nasal Cannula and Simple Face Mask  Additional Equipment: None  Intra-op Plan:   Post-operative Plan:   Informed Consent: I have reviewed the patients History and Physical, chart, labs and discussed the procedure including the risks, benefits and alternatives for the proposed anesthesia with the patient or authorized representative who has indicated his/her understanding and acceptance.     Dental advisory given  Plan Discussed with:   Anesthesia Plan Comments:        Anesthesia Quick Evaluation

## 2020-06-26 NOTE — Transfer of Care (Signed)
Immediate Anesthesia Transfer of Care Note  Patient: Donna Petty  Procedure(s) Performed: TOTAL KNEE ARTHROPLASTY (Left Knee)  Patient Location: PACU  Anesthesia Type:Spinal and MAC combined with regional for post-op pain  Level of Consciousness: awake, alert  and patient cooperative  Airway & Oxygen Therapy: Patient Spontanous Breathing and Patient connected to face mask oxygen  Post-op Assessment: Report given to RN and Post -op Vital signs reviewed and stable  Post vital signs: Reviewed and stable  Last Vitals:  Vitals Value Taken Time  BP 163/62 06/26/20 1124  Temp    Pulse 75 06/26/20 1125  Resp 21 06/26/20 1125  SpO2 100 % 06/26/20 1125  Vitals shown include unvalidated device data.  Last Pain:  Vitals:   06/26/20 0703  TempSrc:   PainSc: 0-No pain      Patients Stated Pain Goal: 4 (38/10/17 5102)  Complications: No complications documented.

## 2020-06-26 NOTE — Anesthesia Postprocedure Evaluation (Signed)
Anesthesia Post Note  Patient: Donna Petty  Procedure(s) Performed: TOTAL KNEE ARTHROPLASTY (Left Knee)     Patient location during evaluation: PACU Anesthesia Type: Spinal Level of consciousness: oriented and awake and alert Pain management: pain level controlled Vital Signs Assessment: post-procedure vital signs reviewed and stable Respiratory status: spontaneous breathing, respiratory function stable and nonlabored ventilation Cardiovascular status: blood pressure returned to baseline and stable Postop Assessment: no headache, no backache, no apparent nausea or vomiting and spinal receding Anesthetic complications: no   No complications documented.  Last Vitals:  Vitals:   06/26/20 1350 06/26/20 1451  BP: (!) 169/67 (!) 158/64  Pulse: 70 68  Resp: 18 17  Temp: 36.5 C 36.5 C  SpO2: 100% 99%    Last Pain:  Vitals:   06/26/20 1451  TempSrc: Oral  PainSc:                  Donna Petty

## 2020-06-26 NOTE — Anesthesia Procedure Notes (Signed)
Anesthesia Regional Block: Adductor canal block   Pre-Anesthetic Checklist: ,, timeout performed, Correct Patient, Correct Site, Correct Laterality, Correct Procedure, Correct Position, site marked, Risks and benefits discussed,  Surgical consent,  Pre-op evaluation,  At surgeon's request and post-op pain management  Laterality: Left  Prep: chloraprep       Needles:  Injection technique: Single-shot  Needle Type: Echogenic Stimulator Needle     Needle Length: 10cm  Needle Gauge: 20     Additional Needles:   Procedures:,,,, ultrasound used (permanent image in chart),,,,  Narrative:  Start time: 06/26/2020 8:20 AM End time: 06/26/2020 8:24 AM Injection made incrementally with aspirations every 5 mL.  Performed by: Personally  Anesthesiologist: Lidia Collum, MD  Additional Notes: Standard monitors applied. Skin prepped. Good needle visualization with ultrasound. Injection made in 5cc increments with no resistance to injection. Patient tolerated the procedure well.

## 2020-06-26 NOTE — Interval H&P Note (Signed)
History and Physical Interval Note:  06/26/2020 6:53 AM  Donna Petty  has presented today for surgery, with the diagnosis of LEFT KNEE OSTEOARTHRITIS.  The various methods of treatment have been discussed with the patient and family. After consideration of risks, benefits and other options for treatment, the patient has consented to  Procedure(s) with comments: TOTAL KNEE ARTHROPLASTY (Left) - 70 mins as a surgical intervention.  The patient's history has been reviewed, patient examined, no change in status, stable for surgery.  I have reviewed the patient's chart and labs.  Questions were answered to the patient's satisfaction.     Mauri Pole

## 2020-06-26 NOTE — Anesthesia Procedure Notes (Addendum)
Spinal  Patient location during procedure: OR Start time: 06/26/2020 9:27 AM End time: 06/26/2020 9:32 AM Staffing Performed: resident/CRNA  Anesthesiologist: Lidia Collum, MD Resident/CRNA: West Pugh, CRNA Preanesthetic Checklist Completed: patient identified, IV checked, site marked, risks and benefits discussed, surgical consent, monitors and equipment checked, pre-op evaluation and timeout performed Spinal Block Patient position: sitting Prep: DuraPrep and site prepped and draped Patient monitoring: heart rate, cardiac monitor, continuous pulse ox and blood pressure Approach: midline Location: L3-4 Injection technique: single-shot Needle Needle type: Pencan and Introducer  Needle gauge: 24 G Needle length: 10 cm Assessment Sensory level: T4 Additional Notes IV functioning, monitors applied to pt. Expiration date of kit checked and confirmed to be in date. Sterile prep and drape, hand hygiene and sterile gloved used. Pt was positioned and spine was prepped in sterile fashion. Skin was anesthetized with lidocaine. Free flow of clear CSF obtained prior to injecting local anesthetic into CSF x 1 attempt. Spinal needle aspirated freely following injection. Needle was carefully withdrawn, and pt tolerated procedure well. Loss of motor and sensory on exam post injection. Dr Christella Hartigan present for procedure.

## 2020-06-26 NOTE — Progress Notes (Signed)
Assisted Dr.Carolyn Witman with left knee adductor canal block. Side rails up, monitors on throughout procedure. See vital signs in flow sheet. Tolerated Procedure well.

## 2020-06-26 NOTE — Op Note (Signed)
NAME:  Donna Petty RECORD NO.:  [id number]                             FACILITY:  [facility]      PHYSICIAN:  Pietro Cassis. Alvan Dame, M.D.  DATE OF BIRTH:  [birth year]      DATE OF PROCEDURE:  06/26/2020                                     OPERATIVE REPORT         PREOPERATIVE DIAGNOSIS:  Left knee osteoarthritis.      POSTOPERATIVE DIAGNOSIS:  Left knee osteoarthritis.      FINDINGS:  The patient was noted to have complete loss of cartilage and   bone-on-bone arthritis with associated osteophytes in the lateral and patellofemoral compartments of   the knee with a significant synovitis and associated effusion.  The patient had failed months of conservative treatment including medications, injection therapy, activity modification.     PROCEDURE:  Left total knee replacement.      COMPONENTS USED:  DePuy Attune rotating platform posterior stabilized knee   system, a size 3N femur, 3 tibia, size 5 mm PS AOX insert, and 32 anatomic patellar   button.      SURGEON:  Pietro Cassis. Alvan Dame, M.D.      ASSISTANT:  Danae Orleans, PA-C.      ANESTHESIA:  Regional and Spinal.      SPECIMENS:  None.      COMPLICATION:  None.      DRAINS:  None.  EBL: <100cc      TOURNIQUET TIME:   Total Tourniquet Time Documented: Thigh (Left) - 26 minutes Total: Thigh (Left) - 26 minutes       The patient was stable to the recovery room.      INDICATION FOR PROCEDURE:  Donna Petty is a 84 y.o. female patient of   mine.  The patient had been seen, evaluated, and treated for months conservatively in the   office with medication, activity modification, and injections.  The patient had   radiographic changes of bone-on-bone arthritis with endplate sclerosis and osteophytes noted.  Based on the radiographic changes and failed conservative measures, the patient   decided to proceed with definitive treatment, total knee replacement.  Risks of infection, DVT, component failure, need for  revision surgery, neurovascular injury were reviewed in the office setting.  The postop course was reviewed stressing the efforts to maximize post-operative satisfaction and function.  Consent was obtained for benefit of pain   relief.      PROCEDURE IN DETAIL:  The patient was brought to the operative theater.   Once adequate anesthesia, preoperative antibiotics, 2 gm of Ancef,1 gm of Tranexamic Acid, and 10 mg of Decadron administered, the patient was positioned supine with a left thigh tourniquet placed.  The  left lower extremity was prepped and draped in sterile fashion.  A time-   out was performed identifying the patient, planned procedure, and the appropriate extremity.      The left lower extremity was placed in the [facility] leg holder.  The leg was   exsanguinated, tourniquet elevated to 250 mmHg.  A midline incision was   made followed  by median parapatellar arthrotomy.  Following initial   exposure, attention was first directed to the patella.  Precut   measurement was noted to be 22 mm.  I resected down to 14 mm and used a   32 anatomic patellar button to restore patellar height as well as cover the cut surface.      The lug holes were drilled and a metal shim was placed to protect the   patella from retractors and saw blade during the procedure.      At this point, attention was now directed to the femur.  The femoral   canal was opened with a drill, irrigated to try to prevent fat emboli.  An   intramedullary rod was passed at 3 degrees valgus, 10 mm of bone was   resected off the distal femur.  Following this resection, the tibia was   subluxated anteriorly.  Using the extramedullary guide, 3-4 mm of bone was resected off   the proximal medial tibia.  We confirmed the gap would be   stable medially and laterally with a size 5 spacer block as well as confirmed that the tibial cut was perpendicular in the coronal plane, checking with an alignment rod.      Once this was done, I  sized the femur to be a size 3 in the anterior-   posterior dimension, chose a narrow component based on medial and   lateral dimension.  The size 3 rotation block was then pinned in   position anterior referenced using the C-clamp to set rotation.  The   anterior, posterior, and  chamfer cuts were made without difficulty nor   notching making certain that I was along the anterior cortex to help   with flexion gap stability.      The final box cut was made off the lateral aspect of distal femur.      At this point, the tibia was sized to be a size 3.  The size 3 tray was   then pinned in position through the medial third of the tubercle,   drilled, and keel punched.  Trial reduction was now carried with a 3 femur,  3 tibia, a size 5 mm PS insert, and the 32 anatomic patella botton.  The knee was brought to full extension with good flexion stability with the patella   tracking through the trochlea without application of pressure.  Given   all these findings the trial components removed.  Final components were   opened and cement was mixed.  The knee was irrigated with normal saline solution and pulse lavage.  The synovial lining was   then injected with 30 cc of 0.25% Marcaine with epinephrine, 1 cc of Toradol and 30 cc of NS for a total of 61 cc.     Final implants were then cemented onto cleaned and dried cut surfaces of bone with the knee brought to extension with a size 5 mm PS trial insert.      Once the cement had fully cured, excess cement was removed   throughout the knee.  I confirmed that I was satisfied with the range of   motion and stability, and the final size 5 mm PS AOX insert was chosen.  It was   placed into the knee.      The tourniquet had been let down at 26 minutes.  No significant   hemostasis was required.  The extensor mechanism was then reapproximated using #1 Vicryl and #1  Stratafix sutures with the knee   in flexion.  The   remaining wound was closed with 2-0  Vicryl and running 4-0 Monocryl.   The knee was cleaned, dried, dressed sterilely using Dermabond and   Aquacel dressing.  The patient was then   brought to recovery room in stable condition, tolerating the procedure   well.   Please note that Physician Assistant, Danae Orleans, PA-C was present for the entirety of the case, and was utilized for pre-operative positioning, peri-operative retractor management, general facilitation of the procedure and for primary wound closure at the end of the case.              Pietro Cassis Alvan Dame, M.D.    06/26/2020 11:22 AM

## 2020-06-26 NOTE — Evaluation (Signed)
Physical Therapy Evaluation Patient Details Name: Donna Petty MRN: [id number] DOB: November 16, 1935 Today's Date: 06/26/2020   History of Present Illness  84 yo female s/p L TKA 06/26/2020.  Clinical Impression  On eval POD 0, pt required Min assist for mobility. She walked ~25 feet with a RW. Moderate pain with activity. Will continue to follow and progress activity as tolerated. Plan is for d/c home on tomorrow if all continues to go well.     Follow Up Recommendations Follow surgeon's recommendation for DC plan and follow-up therapies    Equipment Recommendations  3in1 (PT)    Recommendations for Other Services       Precautions / Restrictions Precautions Precautions: Fall;Knee Restrictions Weight Bearing Restrictions: No Other Position/Activity Restrictions: WBAT      Mobility  Bed Mobility Overal bed mobility: Needs Assistance Bed Mobility: Supine to Sit     Supine to sit: Min assist;HOB elevated     General bed mobility comments: Assist for L LE. Increased time. Cues for safety, technique.    Transfers Overall transfer level: Needs assistance Equipment used: Rolling walker (2 wheeled) Transfers: Sit to/from Stand Sit to Stand: Min assist         General transfer comment: VCs safety, technique, sequence. Assist to rise, steady, control descent.  Ambulation/Gait Ambulation/Gait assistance: Min assist Gait Distance (Feet): 25 Feet Assistive device: Rolling walker (2 wheeled) Gait Pattern/deviations: Step-to pattern;Step-through pattern;Decreased stride length     General Gait Details: Mod cues for safety, technique, sequence, posture, step length. Assist stabilize pt throughout distance.  Stairs            Wheelchair Mobility    Modified Rankin (Stroke Patients Only)       Balance Overall balance assessment: Needs assistance         Standing balance support: Bilateral upper extremity supported Standing balance-Leahy Scale: Poor                                Pertinent Vitals/Pain Pain Assessment: Faces Faces Pain Scale: Hurts even more Pain Location: L knee Pain Descriptors / Indicators: Discomfort;Sore;Aching Pain Intervention(s): Limited activity within patient's tolerance;Monitored during session;Repositioned    Home Living Family/patient expects to be discharged to:: Private residence Living Arrangements: Children Available Help at Discharge: Family Type of Home: House Home Access: Stairs to enter Entrance Stairs-Rails: Psychiatric nurse of Steps: 6 Home Layout: One level Home Equipment: Environmental consultant - 2 wheels      Prior Function Level of Independence: Independent               Hand Dominance        Extremity/Trunk Assessment   Upper Extremity Assessment Upper Extremity Assessment: Overall WFL for tasks assessed    Lower Extremity Assessment Lower Extremity Assessment: Generalized weakness    Cervical / Trunk Assessment Cervical / Trunk Assessment: Normal  Communication   Communication: No difficulties  Cognition Arousal/Alertness: Awake/alert Behavior During Therapy: WFL for tasks assessed/performed Overall Cognitive Status: Within Functional Limits for tasks assessed                                        General Comments      Exercises     Assessment/Plan    PT Assessment Patient needs continued PT services  PT Problem List Decreased strength;Decreased range of motion;Decreased mobility;Decreased activity  tolerance;Decreased balance;Decreased knowledge of use of DME;Pain       PT Treatment Interventions DME instruction;Gait training;Therapeutic activities;Therapeutic exercise;Patient/family education;Balance training;Stair training;Functional mobility training    PT Goals (Current goals can be found in the Care Plan section)  Acute Rehab PT Goals Patient Stated Goal: less pain. regain independence. PT Goal Formulation: With  patient/family Time For Goal Achievement: 07/10/20 Potential to Achieve Goals: Good    Frequency 7X/week   Barriers to discharge        Co-evaluation               AM-PAC PT "6 Clicks" Mobility  Outcome Measure Help needed turning from your back to your side while in a flat bed without using bedrails?: A Little Help needed moving from lying on your back to sitting on the side of a flat bed without using bedrails?: A Little Help needed moving to and from a bed to a chair (including a wheelchair)?: A Little Help needed standing up from a chair using your arms (e.g., wheelchair or bedside chair)?: A Little Help needed to walk in hospital room?: A Little Help needed climbing 3-5 steps with a railing? : A Little 6 Click Score: 18    End of Session Equipment Utilized During Treatment: Gait belt Activity Tolerance: Patient tolerated treatment well Patient left: in chair;with call bell/phone within reach;with chair alarm set;with family/visitor present   PT Visit Diagnosis: Other abnormalities of gait and mobility (R26.89);Unsteadiness on feet (R26.81);Muscle weakness (generalized) (M62.81);Pain Pain - Right/Left: Left Pain - part of body: Knee    Time: 7737-3668 PT Time Calculation (min) (ACUTE ONLY): 26 min   Charges:   PT Evaluation $PT Eval Low Complexity: 1 Low PT Treatments $Gait Training: 8-22 mins          Doreatha Massed, PT Acute Rehabilitation  Office: [phone] Pager: [phone]

## 2020-06-27 ENCOUNTER — Encounter (HOSPITAL_COMMUNITY): Payer: Self-pay | Admitting: Orthopedic Surgery

## 2020-06-27 DIAGNOSIS — E669 Obesity, unspecified: Secondary | ICD-10-CM | POA: Diagnosis present

## 2020-06-27 DIAGNOSIS — Z96652 Presence of left artificial knee joint: Secondary | ICD-10-CM | POA: Diagnosis not present

## 2020-06-27 DIAGNOSIS — M1712 Unilateral primary osteoarthritis, left knee: Secondary | ICD-10-CM | POA: Diagnosis not present

## 2020-06-27 LAB — CBC
HCT: 31.7 % — ABNORMAL LOW (ref 36.0–46.0)
Hemoglobin: 10.3 g/dL — ABNORMAL LOW (ref 12.0–15.0)
MCH: 31.5 pg (ref 26.0–34.0)
MCHC: 32.5 g/dL (ref 30.0–36.0)
MCV: 96.9 fL (ref 80.0–100.0)
Platelets: 259 10*3/uL (ref 150–400)
RBC: 3.27 MIL/uL — ABNORMAL LOW (ref 3.87–5.11)
RDW: 13.5 % (ref 11.5–15.5)
WBC: 12.3 10*3/uL — ABNORMAL HIGH (ref 4.0–10.5)
nRBC: 0 % (ref 0.0–0.2)

## 2020-06-27 LAB — BASIC METABOLIC PANEL
Anion gap: 8 (ref 5–15)
BUN: 13 mg/dL (ref 8–23)
CO2: 22 mmol/L (ref 22–32)
Calcium: 8.3 mg/dL — ABNORMAL LOW (ref 8.9–10.3)
Chloride: 108 mmol/L (ref 98–111)
Creatinine, Ser: 0.99 mg/dL (ref 0.44–1.00)
GFR, Estimated: 57 mL/min — ABNORMAL LOW (ref >60)
Glucose, Bld: 144 mg/dL — ABNORMAL HIGH (ref 70–99)
Potassium: 3.8 mmol/L (ref 3.5–5.1)
Sodium: 138 mmol/L (ref 135–145)

## 2020-06-27 MED ORDER — DOCUSATE SODIUM 100 MG PO CAPS: 100.0000 mg | ORAL_CAPSULE | Freq: Two times a day (BID) | ORAL | 0 refills | Status: DC

## 2020-06-27 MED ORDER — HYDROCODONE-ACETAMINOPHEN 5-325 MG PO TABS
1.0000 | ORAL_TABLET | Freq: Four times a day (QID) | ORAL | 0 refills | Status: AC | PRN
Start: 2020-06-27 — End: ?

## 2020-06-27 MED ORDER — CELECOXIB 200 MG PO CAPS: 200.0000 mg | ORAL_CAPSULE | Freq: Two times a day (BID) | ORAL | 0 refills | Status: DC

## 2020-06-27 MED ORDER — METHOCARBAMOL 500 MG PO TABS: 500.0000 mg | ORAL_TABLET | Freq: Four times a day (QID) | ORAL | 0 refills | Status: DC | PRN

## 2020-06-27 MED ORDER — FERROUS SULFATE 325 (65 FE) MG PO TABS: 325.0000 mg | ORAL_TABLET | Freq: Three times a day (TID) | ORAL | 0 refills | Status: DC

## 2020-06-27 MED ORDER — POLYETHYLENE GLYCOL 3350 17 G PO PACK: 17.0000 g | PACK | Freq: Two times a day (BID) | ORAL | 0 refills | Status: DC

## 2020-06-27 MED ORDER — ASPIRIN 81 MG PO CHEW: 81.0000 mg | CHEWABLE_TABLET | Freq: Two times a day (BID) | ORAL | 0 refills | Status: AC

## 2020-06-27 MED ORDER — ALUM & MAG HYDROXIDE-SIMETH 200-200-20 MG/5ML PO SUSP: 15.0000 mL | ORAL | Status: DC | PRN

## 2020-06-27 NOTE — Progress Notes (Signed)
Physical Therapy Treatment Patient Details Name: Donna Petty MRN: [id number] DOB: 1935-08-06 Today's Date: 06/27/2020    History of Present Illness 84 yo female s/p L TKA 06/26/2020.    PT Comments    2nd session to practice stair negotiation and getting up/down from high bed using step. Daughter present to observe and assist. Issued HEP for pt to perform daily as tolerated. Encouraged pt to walk often at home. All education completed. Okay to d/c from PT standpoint.     Follow Up Recommendations  Follow surgeon's recommendation for DC plan and follow-up therapies     Equipment Recommendations  3in1 (PT)    Recommendations for Other Services       Precautions / Restrictions Precautions Precautions: Fall;Knee Restrictions Weight Bearing Restrictions: No LLE Weight Bearing: Weight bearing as tolerated    Mobility  Bed Mobility Overal bed mobility: Needs Assistance Bed Mobility: Supine to Sit     Supine to sit: Min assist;HOB elevated     General bed mobility comments: oob in recliner  Transfers Overall transfer level: Needs assistance Equipment used: Rolling walker (2 wheeled) Transfers: Sit to/from Stand Sit to Stand: Min assist         General transfer comment: VCs safety, technique, sequence. Assist to rise, steady, control descent. Practiced using step to get up/down from high bed.  Ambulation/Gait Ambulation/Gait assistance: Min assist Gait Distance (Feet): 40 Feet Assistive device: Rolling walker (2 wheeled) Gait Pattern/deviations: Step-to pattern;Trunk flexed     General Gait Details: Mod cues for safety, technique, sequence, posture, step length. Assist stabilize pt throughout distance. Distance limited by pain, fatigue   Stairs Stairs: Yes Stairs assistance: Min assist Stair Management: Step to pattern;Forwards;Two rails Number of Stairs: 2 General stair comments: up and over portable stairs x 1. cues for safety ,technique, sequence.  assist to steady. daughter present to observe/assist.   Wheelchair Mobility    Modified Rankin (Stroke Patients Only)       Balance Overall balance assessment: Needs assistance         Standing balance support: Bilateral upper extremity supported Standing balance-Leahy Scale: Poor                              Cognition Arousal/Alertness: Awake/alert Behavior During Therapy: WFL for tasks assessed/performed Overall Cognitive Status: Within Functional Limits for tasks assessed                                        Exercises Total Joint Exercises Ankle Circles/Pumps: AROM;Both;10 reps Quad Sets: AROM;Both;10 reps Heel Slides: AAROM;Left;10 reps Hip ABduction/ADduction: AAROM;Left;10 reps Straight Leg Raises: AAROM;Left;10 reps Goniometric ROM: ~10-65 degrees    General Comments        Pertinent Vitals/Pain Pain Assessment: Faces Faces Pain Scale: Hurts even more Pain Location: L knee with activity Pain Descriptors / Indicators: Sharp;Discomfort;Grimacing Pain Intervention(s): Limited activity within patient's tolerance;Monitored during session;Repositioned;Ice applied    Home Living                      Prior Function            PT Goals (current goals can now be found in the care plan section) Progress towards PT goals: Progressing toward goals    Frequency    7X/week      PT Plan Current plan remains  appropriate    Co-evaluation              AM-PAC PT "6 Clicks" Mobility   Outcome Measure  Help needed turning from your back to your side while in a flat bed without using bedrails?: A Little Help needed moving from lying on your back to sitting on the side of a flat bed without using bedrails?: A Little Help needed moving to and from a bed to a chair (including a wheelchair)?: A Little Help needed standing up from a chair using your arms (e.g., wheelchair or bedside chair)?: A Little Help needed to  walk in hospital room?: A Little Help needed climbing 3-5 steps with a railing? : A Little 6 Click Score: 18    End of Session Equipment Utilized During Treatment: Gait belt Activity Tolerance: Patient tolerated treatment well Patient left: in chair;with call bell/phone within reach;with family/visitor present   PT Visit Diagnosis: Other abnormalities of gait and mobility (R26.89);Unsteadiness on feet (R26.81);Muscle weakness (generalized) (M62.81);Pain Pain - Right/Left: Left Pain - part of body: Knee     Time: 1355-1415 PT Time Calculation (min) (ACUTE ONLY): 20 min  Charges:  $Gait Training: 8-22 mins $Therapeutic Exercise: 8-22 mins                         Doreatha Massed, PT Acute Rehabilitation  Office: [phone] Pager: [phone]

## 2020-06-27 NOTE — Discharge Summary (Signed)
Patient ID: Donna Petty MRN: [id number] DOB/AGE: 11-19-1935 84 y.o.  Admit date: 06/26/2020 Discharge date: 06/27/2020  Admission Diagnoses:  Principal Problem:   Left knee OA Active Problems:   S/P left TKA   Obese   Discharge Diagnoses:  Same  Past Medical History:  Diagnosis Date  . Acid reflux   . Arthritis   . Carotid artery disease (HCC)    50-70% left internal carotid artery stenosis  . Coronary artery disease   . Hypothyroidism   . Thyroid condition   . Visual changes     Surgeries: Procedure(s):  LEFT TOTAL KNEE ARTHROPLASTY on 06/26/2020   Consultants: N/A  Discharged Condition: Improved  Hospital Course: LACEE GREY is an 84 y.o. female who was admitted 06/26/2020 for operative treatment ofOsteoarthritis of left knee. Patient has severe unremitting pain that affects sleep, daily activities, and work/hobbies. After pre-op clearance the patient was taken to the operating room on 06/26/2020 and underwent  Procedure(s):  LEFT TOTAL KNEE ARTHROPLASTY.    Patient was given perioperative antibiotics:  Anti-infectives (From admission, onward)   Start     Dose/Rate Route Frequency Ordered Stop   06/26/20 1600  ceFAZolin (ANCEF) IVPB 2g/100 mL premix        2 g 200 mL/hr over 30 Minutes Intravenous Every 6 hours 06/26/20 1246 06/26/20 2246   06/26/20 0645  ceFAZolin (ANCEF) IVPB 2g/100 mL premix        2 g 200 mL/hr over 30 Minutes Intravenous On call to O.R. 06/26/20 0641 06/26/20 1003       Patient was given sequential compression devices, early ambulation, and chemoprophylaxis to prevent DVT.  Patient benefited maximally from hospital stay and there were no complications.    Recent vital signs:  Patient Vitals for the past 24 hrs:  BP Temp Temp src Pulse Resp SpO2  06/27/20 0558 (!) 148/63 98.5 F (36.9 C) Oral 73 17 98 %  06/27/20 0206 121/64 98.1 F (36.7 C) Oral 75 17 94 %  06/26/20 2152 133/64 98.3 F (36.8 C) Oral 85 16 96 %  06/26/20 1604  (!) 174/63 97.8 F (36.6 C) Oral 71 18 100 %  06/26/20 1451 (!) 158/64 97.7 F (36.5 C) Oral 68 17 99 %  06/26/20 1350 (!) 169/67 97.7 F (36.5 C) Oral 70 18 100 %  06/26/20 1244 (!) 166/70 (!) 97.5 F (36.4 C) Oral 66 -- 100 %  06/26/20 1145 (!) 163/67 -- -- 71 18 99 %  06/26/20 1130 (!) 163/70 -- -- 73 16 100 %  06/26/20 1122 (!) 163/62 97.9 F (36.6 C) -- 72 12 100 %  06/26/20 0841 (!) 193/80 -- -- 65 15 99 %     Recent laboratory studies:  Recent Labs    06/27/20 0316  WBC 12.3*  HGB 10.3*  HCT 31.7*  PLT 259  NA 138  K 3.8  CL 108  CO2 22  BUN 13  CREATININE 0.99  GLUCOSE 144*  CALCIUM 8.3*     Discharge Medications:   Allergies as of 06/27/2020   No Known Allergies     Medication List    STOP taking these medications   acetaminophen 500 MG tablet Commonly known as: TYLENOL   aspirin EC 81 MG tablet Replaced by: aspirin 81 MG chewable tablet     TAKE these medications   aspirin 81 MG chewable tablet Commonly known as: Aspirin Childrens Chew 1 tablet (81 mg total) by mouth 2 (two) times daily. Take for  4 weeks, then resume regular dose. Start taking on: June 28, 2020 Replaces: aspirin EC 81 MG tablet   calcium carbonate 500 MG chewable tablet Commonly known as: TUMS - dosed in mg elemental calcium Chew 3 tablets by mouth daily as needed for indigestion or heartburn.   Calcium-Vitamin D 600-200 MG-UNIT tablet Take 1 tablet by mouth daily.   celecoxib 200 MG capsule Commonly known as: CeleBREX Take 1 capsule (200 mg total) by mouth 2 (two) times daily.   cetirizine 10 MG tablet Commonly known as: ZYRTEC Take 10 mg by mouth daily as needed for allergies.   docusate sodium 100 MG capsule Commonly known as: Colace Take 1 capsule (100 mg total) by mouth 2 (two) times daily.   ferrous sulfate 325 (65 FE) MG tablet Commonly known as: FerrouSul Take 1 tablet (325 mg total) by mouth 3 (three) times daily with meals for 14 days.   furosemide  40 MG tablet Commonly known as: LASIX Take 40 mg by mouth daily.   Glucosamine Chondroitin Triple Tabs Take 1 tablet by mouth daily.   HYDROcodone-acetaminophen 5-325 MG tablet Commonly known as: Norco Take 1-2 tablets by mouth every 6 (six) hours as needed for moderate pain or severe pain.   ICAPS AREDS 2 PO Take 1 capsule by mouth 2 (two) times daily.   levothyroxine 75 MCG tablet Commonly known as: SYNTHROID Take 75 mcg by mouth daily before breakfast.   memantine 5 MG tablet Commonly known as: NAMENDA Take 5 mg by mouth 2 (two) times daily.   methocarbamol 500 MG tablet Commonly known as: Robaxin Take 1 tablet (500 mg total) by mouth every 6 (six) hours as needed for muscle spasms.   multivitamin with minerals Tabs tablet Take 1 tablet by mouth daily.   omeprazole 40 MG capsule Commonly known as: PRILOSEC Take 40 mg by mouth daily.   polyethylene glycol 17 g packet Commonly known as: MIRALAX / GLYCOLAX Take 17 g by mouth 2 (two) times daily.   potassium chloride SA 20 MEQ tablet Commonly known as: KLOR-CON Take 10 mEq by mouth every evening.   REFRESH OP Place 1 drop into both eyes in the morning and at bedtime.   simvastatin 10 MG tablet Commonly known as: ZOCOR Take 10 mg by mouth 3 (three) times a week. Sun, Wed, and Fri, evening            Discharge Care Instructions  (From admission, onward)         Start     Ordered   06/27/20 0000  Change dressing       Comments: Maintain surgical dressing until follow up in the clinic. If the edges start to pull up, may reinforce with tape. If the dressing is no longer working, may remove and cover with gauze and tape, but must keep the area dry and clean.  Call with any questions or concerns.   06/27/20 0837          Diagnostic Studies: No results found.  Disposition: HOME  Discharge Instructions    Call MD / Call 911   Complete by: As directed    If you experience chest pain or shortness of  breath, CALL 911 and be transported to the hospital emergency room.  If you develope a fever above 101 F, pus (white drainage) or increased drainage or redness at the wound, or calf pain, call your surgeon's office.   Change dressing   Complete by: As directed    Maintain  surgical dressing until follow up in the clinic. If the edges start to pull up, may reinforce with tape. If the dressing is no longer working, may remove and cover with gauze and tape, but must keep the area dry and clean.  Call with any questions or concerns.   Constipation Prevention   Complete by: As directed    Drink plenty of fluids.  Prune juice may be helpful.  You may use a stool softener, such as Colace (over the counter) 100 mg twice a day.  Use MiraLax (over the counter) for constipation as needed.   Diet - low sodium heart healthy   Complete by: As directed    Discharge instructions   Complete by: As directed    Maintain surgical dressing until follow up in the clinic. If the edges start to pull up, may reinforce with tape. If the dressing is no longer working, may remove and cover with gauze and tape, but must keep the area dry and clean.  Follow up in 2 weeks at [facility]. Call with any questions or concerns.   Increase activity slowly as tolerated   Complete by: As directed    Weight bearing as tolerated with assist device (walker, cane, etc) as directed, use it as long as suggested by your surgeon or therapist, typically at least 4-6 weeks.   TED hose   Complete by: As directed    Use stockings (TED hose) for 2 weeks on both leg(s).  You may remove them at night for sleeping.       Follow-up Information    Rosilyn Mings.. Go on 07/02/2020.   Why: You are scheduled for a physical therapy appointment on 07-02-20 at 2:30 pm.  Contact information: [place] [address] [phone]        Danae Orleans, PA-C. Go on 07/11/2020.   Specialty: Orthopedic Surgery Why:  You are scheduled for a post-operative appointment on 07-11-20 at 2:15 pm.  Contact information: [address] [place] [address] [phone]                Signed: Lucille Passy [facility] 06/27/2020, 8:38 AM

## 2020-06-27 NOTE — Progress Notes (Signed)
Physical Therapy Treatment Patient Details Name: Donna Petty MRN: [id number] DOB: Jul 28, 1936 Today's Date: 06/27/2020    History of Present Illness 84 yo female s/p L TKA 06/26/2020.    PT Comments    Progressing with mobility. Mobility was limited by pain this session. Will plan to have a 2nd session to practice stair negotiation prior to potential d/c home later today.    Follow Up Recommendations  Follow surgeons recommendation for DC plan and follow-up therapies     Equipment Recommendations  3in1 (PT)    Recommendations for Other Services       Precautions / Restrictions Precautions Precautions: Fall;Knee Restrictions Weight Bearing Restrictions: No LLE Weight Bearing: Weight bearing as tolerated    Mobility  Bed Mobility Overal bed mobility: Needs Assistance Bed Mobility: Supine to Sit     Supine to sit: Min assist;HOB elevated     General bed mobility comments: Assist for L LE. Increased time. Cues for safety, technique.  Transfers Overall transfer level: Needs assistance Equipment used: Rolling walker (2 wheeled) Transfers: Sit to/from Stand Sit to Stand: Min assist         General transfer comment: VCs safety, technique, sequence. Assist to rise, steady, control descent.  Ambulation/Gait Ambulation/Gait assistance: Min assist Gait Distance (Feet): 35 Feet Assistive device: Rolling walker (2 wheeled) Gait Pattern/deviations: Step-to pattern;Antalgic;Trunk flexed     General Gait Details: Mod cues for safety, technique, sequence, posture, step length. Assist stabilize pt throughout distance. Distance limited by pain.   Stairs             Wheelchair Mobility    Modified Rankin (Stroke Patients Only)       Balance Overall balance assessment: Needs assistance         Standing balance support: Bilateral upper extremity supported Standing balance-Leahy Scale: Poor                              Cognition  Arousal/Alertness: Awake/alert Behavior During Therapy: WFL for tasks assessed/performed Overall Cognitive Status: Within Functional Limits for tasks assessed                                        Exercises Total Joint Exercises Ankle Circles/Pumps: AROM;Both;10 reps Quad Sets: AROM;Both;10 reps Heel Slides: AAROM;Left;10 reps Hip ABduction/ADduction: AAROM;Left;10 reps Straight Leg Raises: AAROM;Left;10 reps Goniometric ROM: ~10-65 degrees    General Comments        Pertinent Vitals/Pain Pain Assessment: Faces Faces Pain Scale: Hurts even more Pain Location: L knee Pain Descriptors / Indicators: Moaning;Sharp;Discomfort;Grimacing Pain Intervention(s): Limited activity within patient's tolerance;Monitored during session;Repositioned;Ice applied    Home Living                      Prior Function            PT Goals (current goals can now be found in the care plan section) Progress towards PT goals: Progressing toward goals    Frequency    7X/week      PT Plan Current plan remains appropriate    Co-evaluation              AM-PAC PT "6 Clicks" Mobility   Outcome Measure  Help needed turning from your back to your side while in a flat bed without using bedrails?: A Little Help needed moving from  lying on your back to sitting on the side of a flat bed without using bedrails?: A Little Help needed moving to and from a bed to a chair (including a wheelchair)?: A Little Help needed standing up from a chair using your arms (e.g., wheelchair or bedside chair)?: A Little Help needed to walk in hospital room?: A Little Help needed climbing 3-5 steps with a railing? : A Little 6 Click Score: 18    End of Session Equipment Utilized During Treatment: Gait belt Activity Tolerance: Patient tolerated treatment well Patient left: in chair;with call bell/phone within reach;with chair alarm set;with family/visitor present   PT Visit Diagnosis:  Other abnormalities of gait and mobility (R26.89);Unsteadiness on feet (R26.81);Muscle weakness (generalized) (M62.81);Pain Pain - Right/Left: Left Pain - part of body: Knee     Time: 7322-0254 PT Time Calculation (min) (ACUTE ONLY): 27 min  Charges:  $Gait Training: 8-22 mins $Therapeutic Exercise: 8-22 mins                        Doreatha Massed, PT Acute Rehabilitation  Office: [phone] Pager: [phone]

## 2020-06-27 NOTE — Progress Notes (Signed)
     Subjective: 1 Day Post-Op Procedure(s) (LRB): TOTAL KNEE ARTHROPLASTY (Left)   Patient reports pain as mild, pain controlled with medication.  No reported events throughout the night.  Dr. Alvan Dame discussed the procedure, findings and expectations moving forward.  Ready to be discharged home, if they do well with PT.  Follow up in the clinic in 2 weeks.  Knows to call with any questions or concerns.     Patient's anticipated LOS is less than 2 midnights, meeting these requirements: - Lives within 1 hour of care - Has a competent adult at home to recover with post-op recover - NO history of  - Chronic pain requiring opiods  - Diabetes  - Coronary Artery Disease  - Heart failure  - Heart attack  - Stroke  - DVT/VTE  - Cardiac arrhythmia  - Respiratory Failure/COPD  - Renal failure  - Anemia  - Advanced Liver disease    Objective:   VITALS:   Vitals:   06/27/20 0206 06/27/20 0558  BP: 121/64 (!) 148/63  Pulse: 75 73  Resp: 17 17  Temp: 98.1 F (36.7 C) 98.5 F (36.9 C)  SpO2: 94% 98%    Dorsiflexion/Plantar flexion intact Incision: dressing C/D/I No cellulitis present Compartment soft  LABS Recent Labs    06/27/20 0316  HGB 10.3*  HCT 31.7*  WBC 12.3*  PLT 259    Recent Labs    06/27/20 0316  NA 138  K 3.8  BUN 13  CREATININE 0.99  GLUCOSE 144*     Assessment/Plan: 1 Day Post-Op Procedure(s) (LRB): TOTAL KNEE ARTHROPLASTY (Left) Foley cath d/c'ed Advance diet Up with therapy D/C IV fluids Discharge home  Follow up in 2 weeks at [facility] Follow up with OLIN,Kaylina Cahue D in 2 weeks.  Contact information:  [facility] [address] [phone]    Obese (BMI 30-39.9) Estimated body mass index is 32.12 kg/m as calculated from the following:   Height as of this encounter: 5\' 1"  (1.549 m).   Weight as of this encounter: 77.1 kg. Patient also counseled that weight may inhibit the healing  process Patient counseled that losing weight will help with future health issues       Danae Orleans PA-C  [facility]  [place] [address], [place], [place] [address] Phone: [phone] [email or web address] Facebook  [employer]

## 2020-07-02 DIAGNOSIS — M25662 Stiffness of left knee, not elsewhere classified: Secondary | ICD-10-CM | POA: Diagnosis not present

## 2020-07-05 DIAGNOSIS — M25662 Stiffness of left knee, not elsewhere classified: Secondary | ICD-10-CM | POA: Diagnosis not present

## 2020-07-10 DIAGNOSIS — M25662 Stiffness of left knee, not elsewhere classified: Secondary | ICD-10-CM | POA: Diagnosis not present

## 2020-07-12 DIAGNOSIS — M25662 Stiffness of left knee, not elsewhere classified: Secondary | ICD-10-CM | POA: Diagnosis not present

## 2020-07-17 DIAGNOSIS — M25662 Stiffness of left knee, not elsewhere classified: Secondary | ICD-10-CM | POA: Diagnosis not present

## 2020-07-19 DIAGNOSIS — M25662 Stiffness of left knee, not elsewhere classified: Secondary | ICD-10-CM | POA: Diagnosis not present

## 2020-07-24 DIAGNOSIS — M25662 Stiffness of left knee, not elsewhere classified: Secondary | ICD-10-CM | POA: Diagnosis not present

## 2020-07-26 DIAGNOSIS — M25662 Stiffness of left knee, not elsewhere classified: Secondary | ICD-10-CM | POA: Diagnosis not present

## 2020-07-31 DIAGNOSIS — M25662 Stiffness of left knee, not elsewhere classified: Secondary | ICD-10-CM | POA: Diagnosis not present

## 2020-08-06 DIAGNOSIS — M1712 Unilateral primary osteoarthritis, left knee: Secondary | ICD-10-CM | POA: Diagnosis not present

## 2020-08-09 DIAGNOSIS — M25662 Stiffness of left knee, not elsewhere classified: Secondary | ICD-10-CM | POA: Diagnosis not present

## 2020-08-13 DIAGNOSIS — M25662 Stiffness of left knee, not elsewhere classified: Secondary | ICD-10-CM | POA: Diagnosis not present

## 2020-08-15 DIAGNOSIS — M25662 Stiffness of left knee, not elsewhere classified: Secondary | ICD-10-CM | POA: Diagnosis not present

## 2020-08-17 DIAGNOSIS — Z471 Aftercare following joint replacement surgery: Secondary | ICD-10-CM | POA: Diagnosis not present

## 2020-08-17 DIAGNOSIS — Z96652 Presence of left artificial knee joint: Secondary | ICD-10-CM | POA: Diagnosis not present

## 2020-08-28 DIAGNOSIS — M25662 Stiffness of left knee, not elsewhere classified: Secondary | ICD-10-CM | POA: Diagnosis not present

## 2020-08-30 DIAGNOSIS — M1712 Unilateral primary osteoarthritis, left knee: Secondary | ICD-10-CM | POA: Diagnosis not present

## 2020-09-04 DIAGNOSIS — M25662 Stiffness of left knee, not elsewhere classified: Secondary | ICD-10-CM | POA: Diagnosis not present

## 2020-09-06 DIAGNOSIS — M1712 Unilateral primary osteoarthritis, left knee: Secondary | ICD-10-CM | POA: Diagnosis not present

## 2020-09-11 DIAGNOSIS — M25662 Stiffness of left knee, not elsewhere classified: Secondary | ICD-10-CM | POA: Diagnosis not present

## 2020-09-13 DIAGNOSIS — M25662 Stiffness of left knee, not elsewhere classified: Secondary | ICD-10-CM | POA: Diagnosis not present

## 2020-09-18 DIAGNOSIS — M25662 Stiffness of left knee, not elsewhere classified: Secondary | ICD-10-CM | POA: Diagnosis not present

## 2020-09-20 DIAGNOSIS — M25662 Stiffness of left knee, not elsewhere classified: Secondary | ICD-10-CM | POA: Diagnosis not present

## 2020-09-25 DIAGNOSIS — M25662 Stiffness of left knee, not elsewhere classified: Secondary | ICD-10-CM | POA: Diagnosis not present

## 2020-10-08 DIAGNOSIS — H353112 Nonexudative age-related macular degeneration, right eye, intermediate dry stage: Secondary | ICD-10-CM | POA: Diagnosis not present

## 2020-10-08 DIAGNOSIS — H0100A Unspecified blepharitis right eye, upper and lower eyelids: Secondary | ICD-10-CM | POA: Diagnosis not present

## 2020-10-08 DIAGNOSIS — H04123 Dry eye syndrome of bilateral lacrimal glands: Secondary | ICD-10-CM | POA: Diagnosis not present

## 2020-10-08 DIAGNOSIS — H35342 Macular cyst, hole, or pseudohole, left eye: Secondary | ICD-10-CM | POA: Diagnosis not present

## 2020-10-08 DIAGNOSIS — D3131 Benign neoplasm of right choroid: Secondary | ICD-10-CM | POA: Diagnosis not present

## 2020-11-20 DIAGNOSIS — E039 Hypothyroidism, unspecified: Secondary | ICD-10-CM | POA: Diagnosis not present

## 2020-11-20 DIAGNOSIS — E7800 Pure hypercholesterolemia, unspecified: Secondary | ICD-10-CM | POA: Diagnosis not present

## 2020-11-20 DIAGNOSIS — R7309 Other abnormal glucose: Secondary | ICD-10-CM | POA: Diagnosis not present

## 2020-11-20 DIAGNOSIS — I10 Essential (primary) hypertension: Secondary | ICD-10-CM | POA: Diagnosis not present

## 2020-11-28 DIAGNOSIS — E039 Hypothyroidism, unspecified: Secondary | ICD-10-CM | POA: Diagnosis not present

## 2020-11-28 DIAGNOSIS — E785 Hyperlipidemia, unspecified: Secondary | ICD-10-CM | POA: Diagnosis not present

## 2020-11-28 DIAGNOSIS — K219 Gastro-esophageal reflux disease without esophagitis: Secondary | ICD-10-CM | POA: Diagnosis not present

## 2020-11-28 DIAGNOSIS — R7303 Prediabetes: Secondary | ICD-10-CM | POA: Diagnosis not present

## 2020-11-28 DIAGNOSIS — I10 Essential (primary) hypertension: Secondary | ICD-10-CM | POA: Diagnosis not present

## 2020-11-28 DIAGNOSIS — Z0000 Encounter for general adult medical examination without abnormal findings: Secondary | ICD-10-CM | POA: Diagnosis not present

## 2021-04-10 DIAGNOSIS — H04123 Dry eye syndrome of bilateral lacrimal glands: Secondary | ICD-10-CM | POA: Diagnosis not present

## 2021-04-10 DIAGNOSIS — H353122 Nonexudative age-related macular degeneration, left eye, intermediate dry stage: Secondary | ICD-10-CM | POA: Diagnosis not present

## 2021-04-10 DIAGNOSIS — D3131 Benign neoplasm of right choroid: Secondary | ICD-10-CM | POA: Diagnosis not present

## 2021-04-10 DIAGNOSIS — H35373 Puckering of macula, bilateral: Secondary | ICD-10-CM | POA: Diagnosis not present

## 2021-05-23 DIAGNOSIS — I10 Essential (primary) hypertension: Secondary | ICD-10-CM | POA: Diagnosis not present

## 2021-05-23 DIAGNOSIS — R7303 Prediabetes: Secondary | ICD-10-CM | POA: Diagnosis not present

## 2021-05-23 DIAGNOSIS — E039 Hypothyroidism, unspecified: Secondary | ICD-10-CM | POA: Diagnosis not present

## 2021-05-23 DIAGNOSIS — E785 Hyperlipidemia, unspecified: Secondary | ICD-10-CM | POA: Diagnosis not present

## 2021-05-30 DIAGNOSIS — Z23 Encounter for immunization: Secondary | ICD-10-CM | POA: Diagnosis not present

## 2021-05-30 DIAGNOSIS — I872 Venous insufficiency (chronic) (peripheral): Secondary | ICD-10-CM | POA: Diagnosis not present

## 2021-05-30 DIAGNOSIS — E7800 Pure hypercholesterolemia, unspecified: Secondary | ICD-10-CM | POA: Diagnosis not present

## 2021-05-30 DIAGNOSIS — E039 Hypothyroidism, unspecified: Secondary | ICD-10-CM | POA: Diagnosis not present

## 2021-05-30 DIAGNOSIS — R040 Epistaxis: Secondary | ICD-10-CM | POA: Diagnosis not present

## 2021-05-30 DIAGNOSIS — K219 Gastro-esophageal reflux disease without esophagitis: Secondary | ICD-10-CM | POA: Diagnosis not present

## 2021-05-30 DIAGNOSIS — I10 Essential (primary) hypertension: Secondary | ICD-10-CM | POA: Diagnosis not present

## 2021-05-30 DIAGNOSIS — R413 Other amnesia: Secondary | ICD-10-CM | POA: Diagnosis not present

## 2021-06-19 DIAGNOSIS — Z96652 Presence of left artificial knee joint: Secondary | ICD-10-CM | POA: Diagnosis not present

## 2021-06-19 DIAGNOSIS — M1711 Unilateral primary osteoarthritis, right knee: Secondary | ICD-10-CM | POA: Diagnosis not present

## 2021-08-29 DIAGNOSIS — D225 Melanocytic nevi of trunk: Secondary | ICD-10-CM | POA: Diagnosis not present

## 2021-08-29 DIAGNOSIS — L821 Other seborrheic keratosis: Secondary | ICD-10-CM | POA: Diagnosis not present

## 2021-08-29 DIAGNOSIS — L538 Other specified erythematous conditions: Secondary | ICD-10-CM | POA: Diagnosis not present

## 2021-08-29 DIAGNOSIS — D224 Melanocytic nevi of scalp and neck: Secondary | ICD-10-CM | POA: Diagnosis not present

## 2021-08-29 DIAGNOSIS — L820 Inflamed seborrheic keratosis: Secondary | ICD-10-CM | POA: Diagnosis not present

## 2021-08-30 IMAGING — CT CT HEAD W/O CM
3 of 4 series · 16 of 47 positions shown, 19 images · non-contrast
Comparison: None.

CLINICAL DATA: Memory loss

EXAM:
CT HEAD WITHOUT CONTRAST
TECHNIQUE: Contiguous axial images were obtained from the base of the skull
through the vertex without intravenous contrast.

[Series 2: head 5.00 hr40 s3 axial ibhc · axial · 0.40mm/px · z∈[-618,-488]mm · 10 of 32 slices shown, 13 images]
[im 3/32  brain]
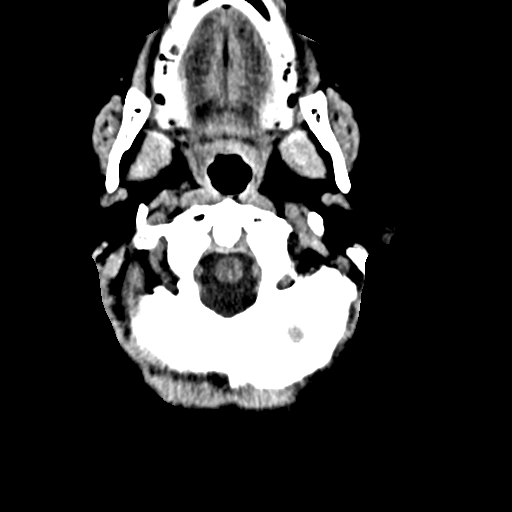
[im 3/32  bone]
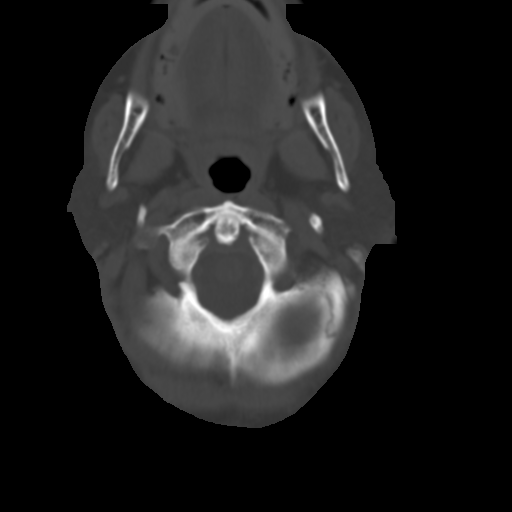
[im 5/32  brain]
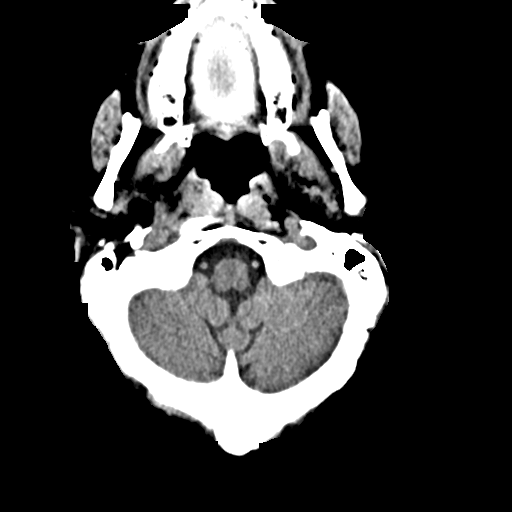
[im 9/32  brain]
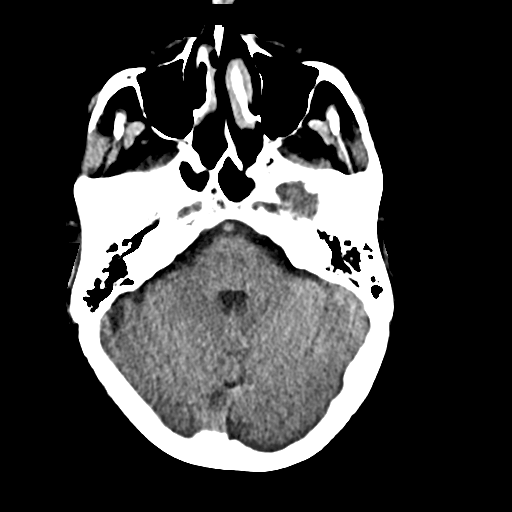
[im 12/32  brain]
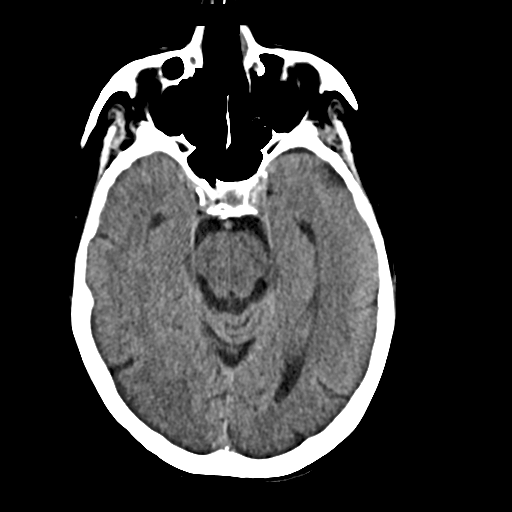
[im 14/32  brain]
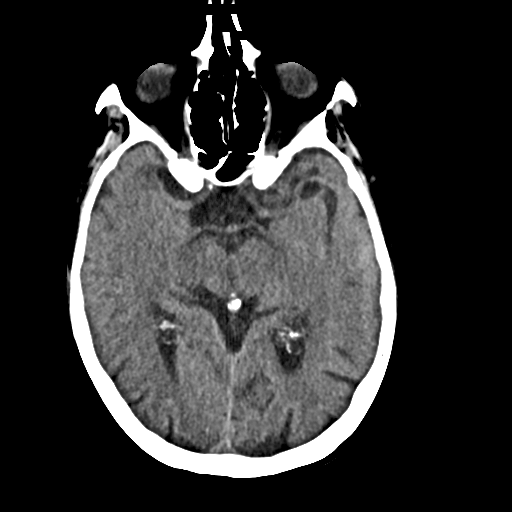
[im 14/32  bone]
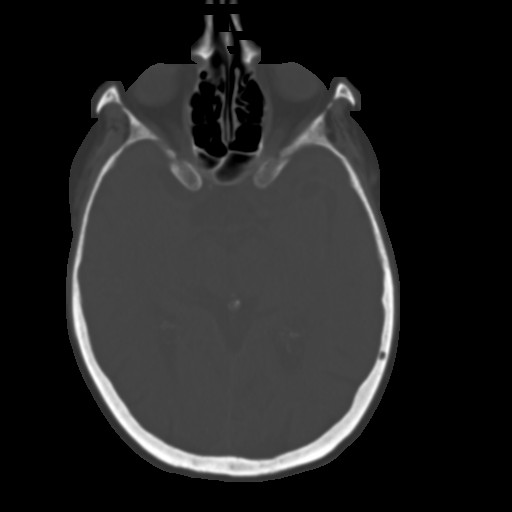
[im 18/32  brain]
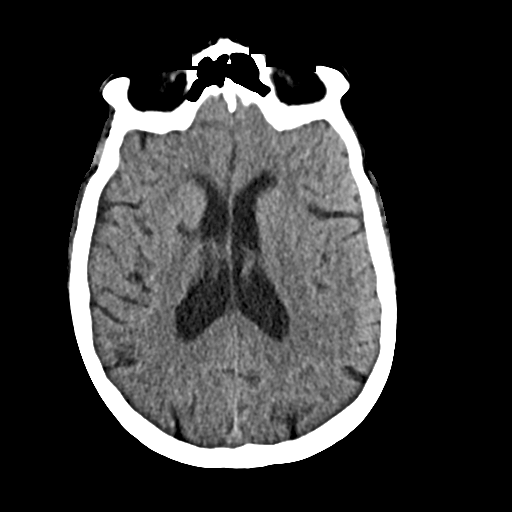
[im 20/32  brain]
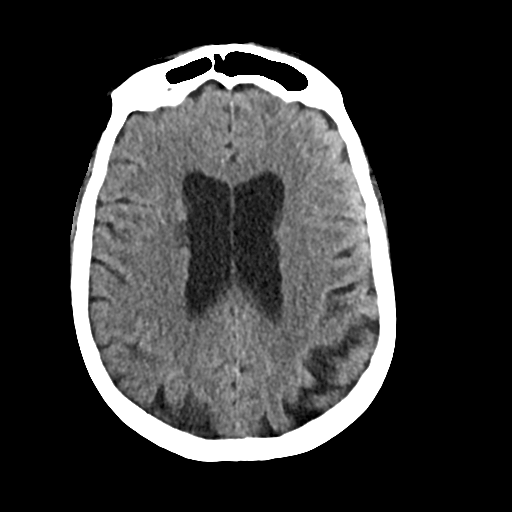
[im 23/32  brain]
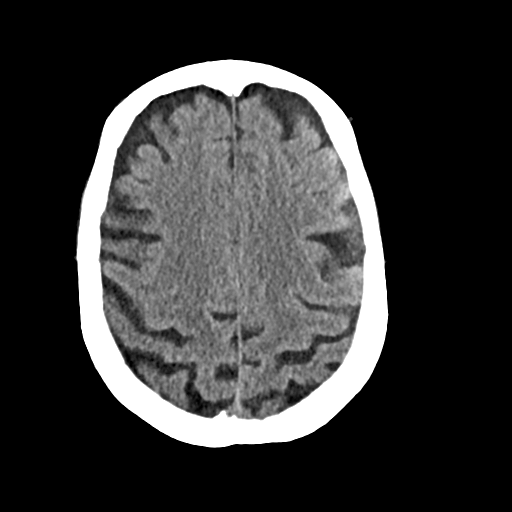
[im 27/32  brain]
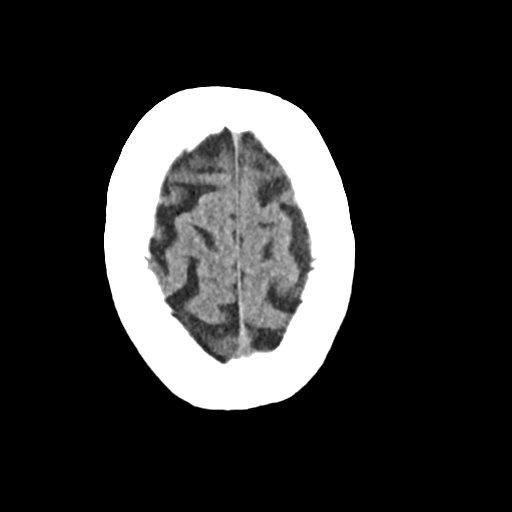
[im 27/32  bone]
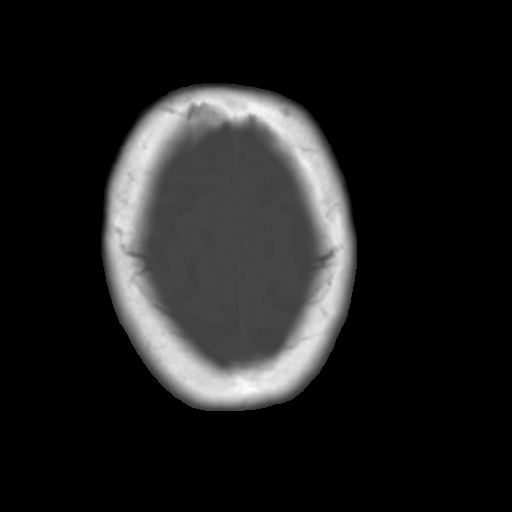
[im 29/32  brain]
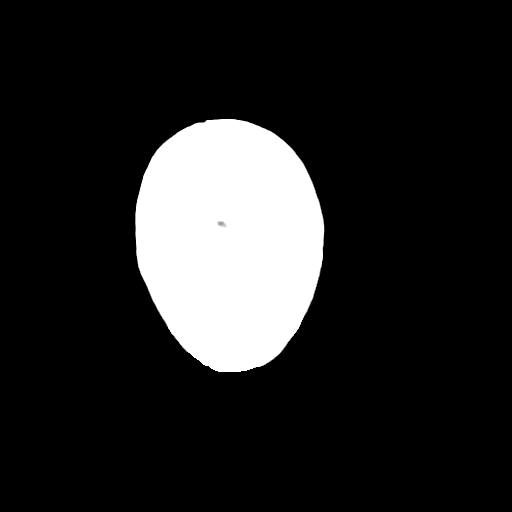

[Series 4: head 3.00 hr40 s3 sag · sagittal · 0.32mm/px · 3 of 54 slices shown]
[im 18/54  brain]
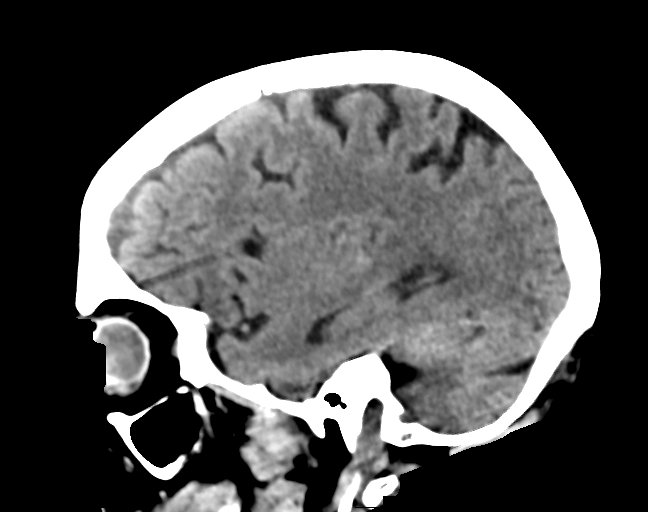
[im 27/54  brain]
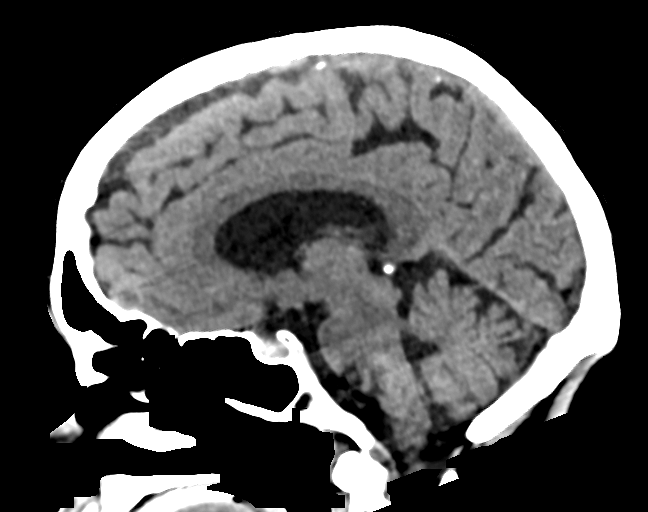
[im 36/54  brain]
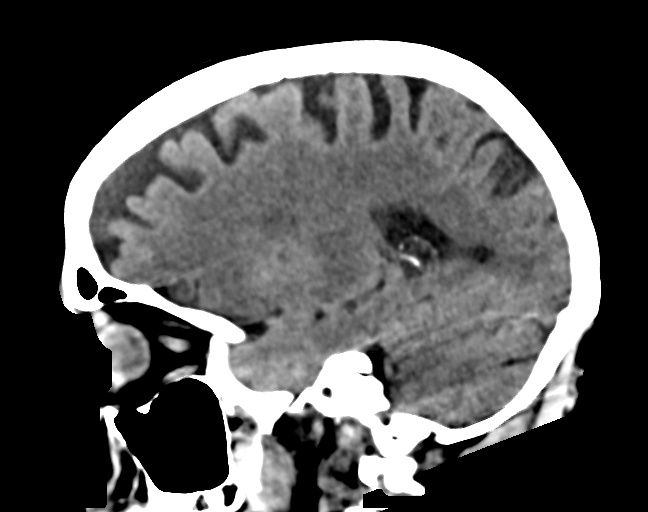

[Series 6: head 3.00 hr40 s3 cor · coronal · 0.31mm/px · 3 of 68 slices shown]
[im 23/68  brain]
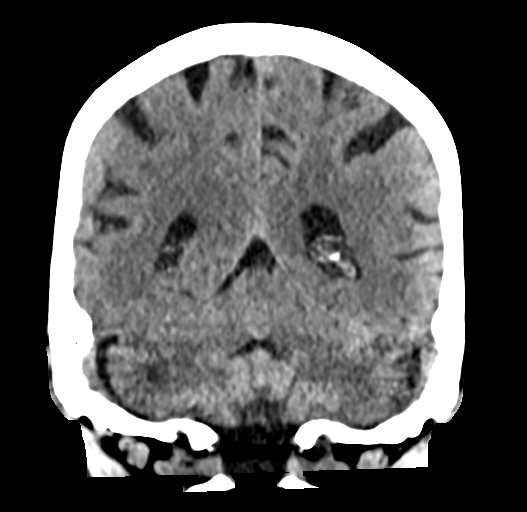
[im 30/68  brain]
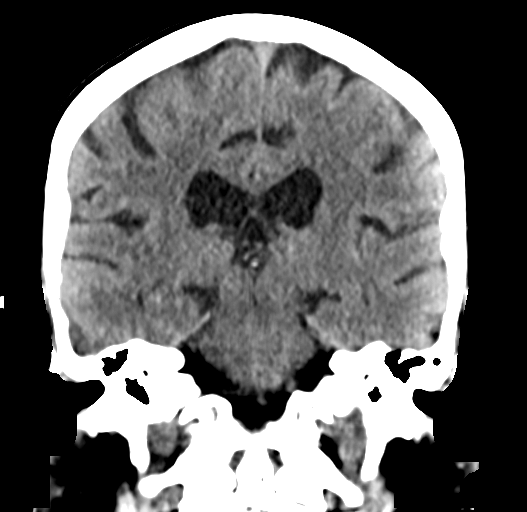
[im 38/68  brain]
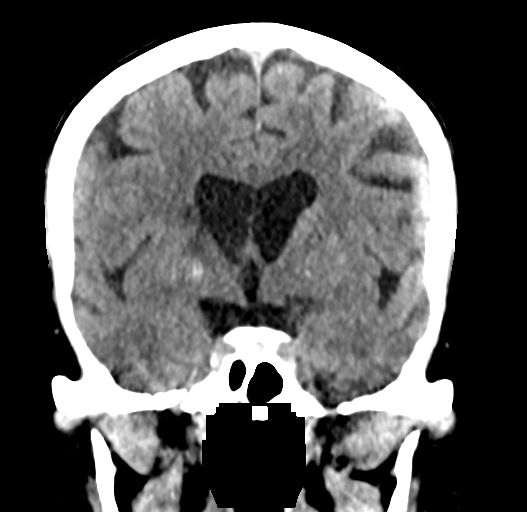

[16 of 47 positions shown; findings below may reference images not displayed]

FINDINGS: Brain: Old right basal ganglia lacunar infarcts. Mild age related
volume loss. No acute intracranial abnormality. Specifically, no
hemorrhage, hydrocephalus, mass lesion, acute infarction, or
significant intracranial injury.

Vascular: No hyperdense vessel or unexpected calcification.

Skull: No acute calvarial abnormality.

Sinuses/Orbits: Visualized paranasal sinuses and mastoids clear.
Orbital soft tissues unremarkable.

Other: None
IMPRESSION: Old right basal ganglia lacunar infarcts.

No acute intracranial abnormality.

## 2021-10-16 DIAGNOSIS — H353122 Nonexudative age-related macular degeneration, left eye, intermediate dry stage: Secondary | ICD-10-CM | POA: Diagnosis not present

## 2021-10-16 DIAGNOSIS — H04123 Dry eye syndrome of bilateral lacrimal glands: Secondary | ICD-10-CM | POA: Diagnosis not present

## 2021-10-16 DIAGNOSIS — H353124 Nonexudative age-related macular degeneration, left eye, advanced atrophic with subfoveal involvement: Secondary | ICD-10-CM | POA: Diagnosis not present

## 2021-10-16 DIAGNOSIS — H35373 Puckering of macula, bilateral: Secondary | ICD-10-CM | POA: Diagnosis not present

## 2021-11-25 DIAGNOSIS — I10 Essential (primary) hypertension: Secondary | ICD-10-CM | POA: Diagnosis not present

## 2021-11-25 DIAGNOSIS — E7801 Familial hypercholesterolemia: Secondary | ICD-10-CM | POA: Diagnosis not present

## 2021-11-25 DIAGNOSIS — E039 Hypothyroidism, unspecified: Secondary | ICD-10-CM | POA: Diagnosis not present

## 2021-12-02 DIAGNOSIS — N3281 Overactive bladder: Secondary | ICD-10-CM | POA: Diagnosis not present

## 2021-12-02 DIAGNOSIS — Z0000 Encounter for general adult medical examination without abnormal findings: Secondary | ICD-10-CM | POA: Diagnosis not present

## 2021-12-02 DIAGNOSIS — R413 Other amnesia: Secondary | ICD-10-CM | POA: Diagnosis not present

## 2021-12-02 DIAGNOSIS — K219 Gastro-esophageal reflux disease without esophagitis: Secondary | ICD-10-CM | POA: Diagnosis not present

## 2021-12-02 DIAGNOSIS — E7800 Pure hypercholesterolemia, unspecified: Secondary | ICD-10-CM | POA: Diagnosis not present

## 2021-12-02 DIAGNOSIS — E039 Hypothyroidism, unspecified: Secondary | ICD-10-CM | POA: Diagnosis not present

## 2021-12-02 DIAGNOSIS — Z23 Encounter for immunization: Secondary | ICD-10-CM | POA: Diagnosis not present

## 2021-12-02 DIAGNOSIS — I10 Essential (primary) hypertension: Secondary | ICD-10-CM | POA: Diagnosis not present

## 2022-02-18 IMAGING — US US EXTREM LOW VENOUS*L*
1 series · 13 of 24 positions shown · non-contrast
Comparison: None.

CLINICAL DATA: Left lower leg pain.

EXAM:
Left LOWER EXTREMITY VENOUS DOPPLER ULTRASOUND
TECHNIQUE: Gray-scale sonography with compression, as well as color and duplex
ultrasound, were performed to evaluate the deep venous system(s)
from the level of the common femoral vein through the popliteal and
proximal calf veins.

[Series 1: us extrem low venous*left* · 0.08mm/px · 13 of 48 slices shown]
[im 1/48]
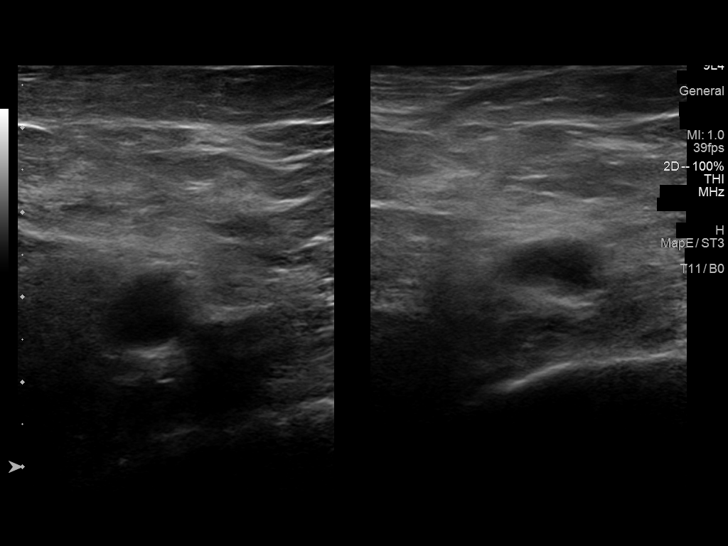
[im 5/48]
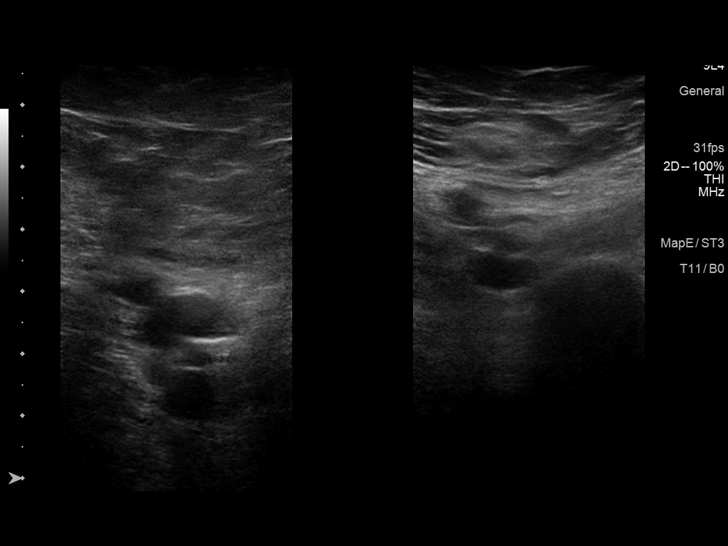
[im 9/48]
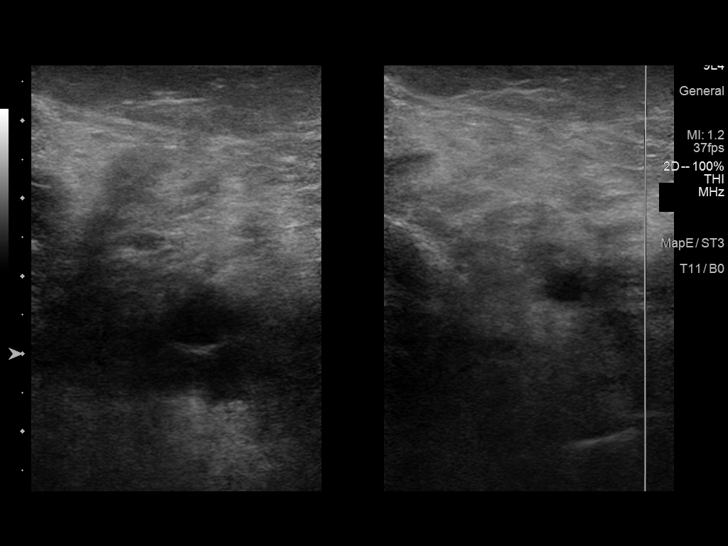
[im 13/48]
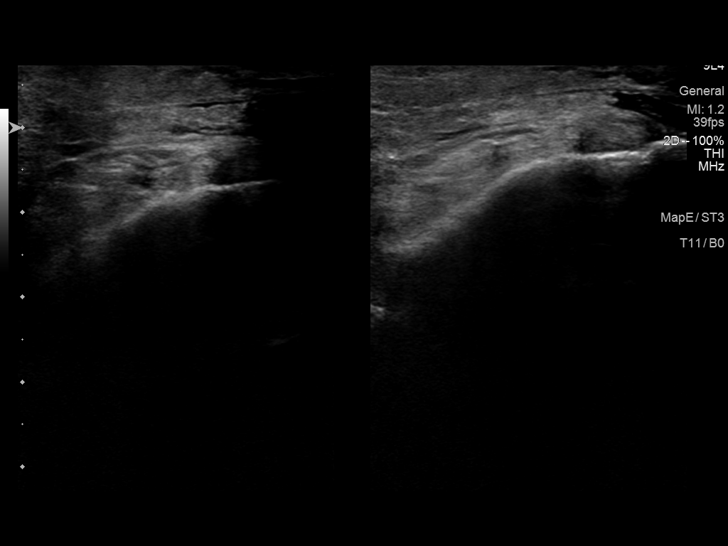
[im 17/48]
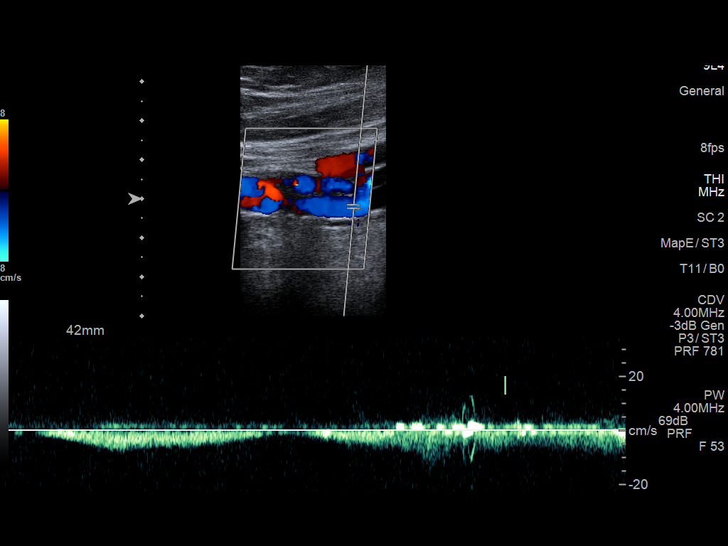
[im 21/48]
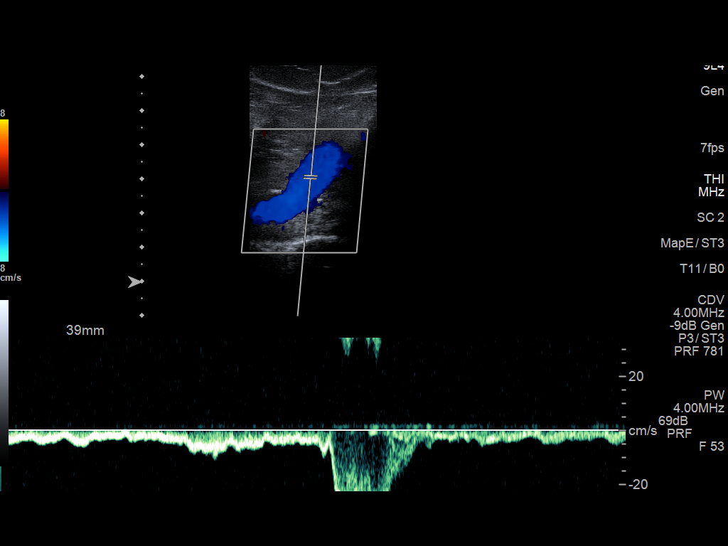
[im 25/48]
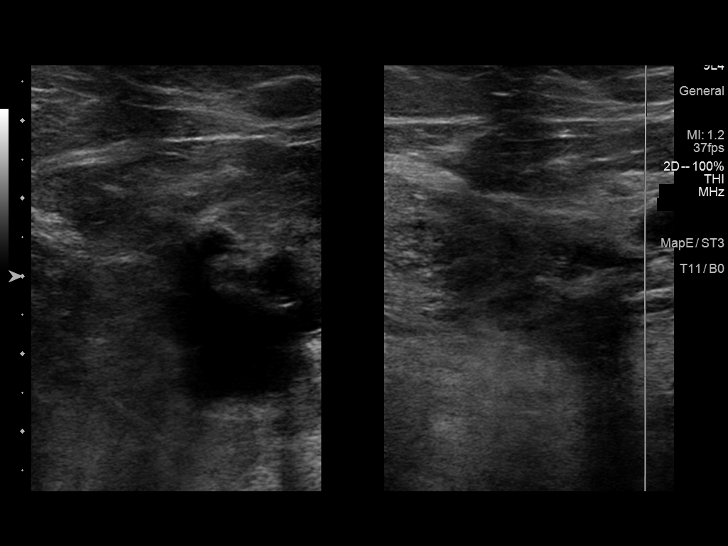
[im 27/48]
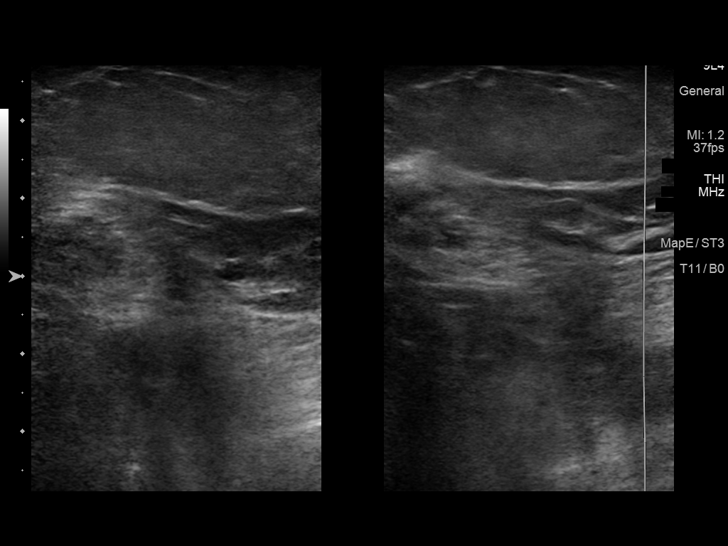
[im 31/48]
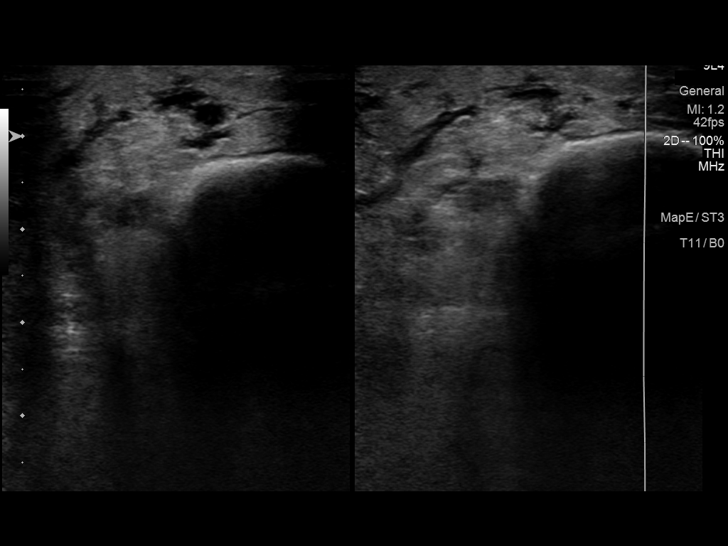
[im 35/48]
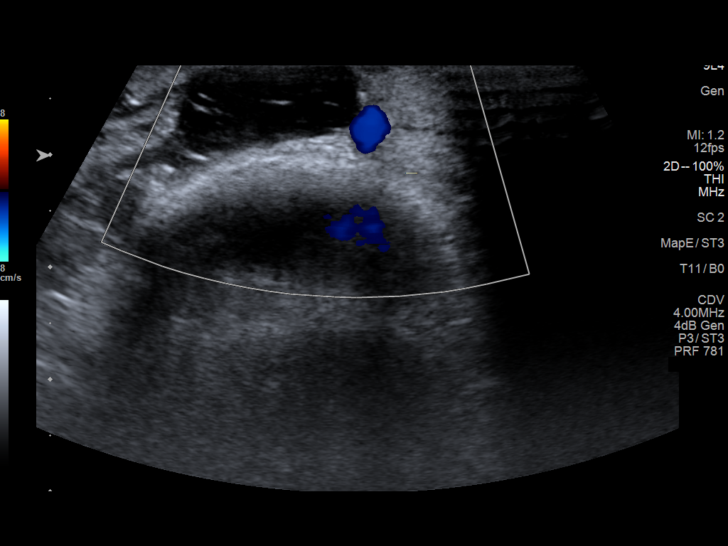
[im 39/48]
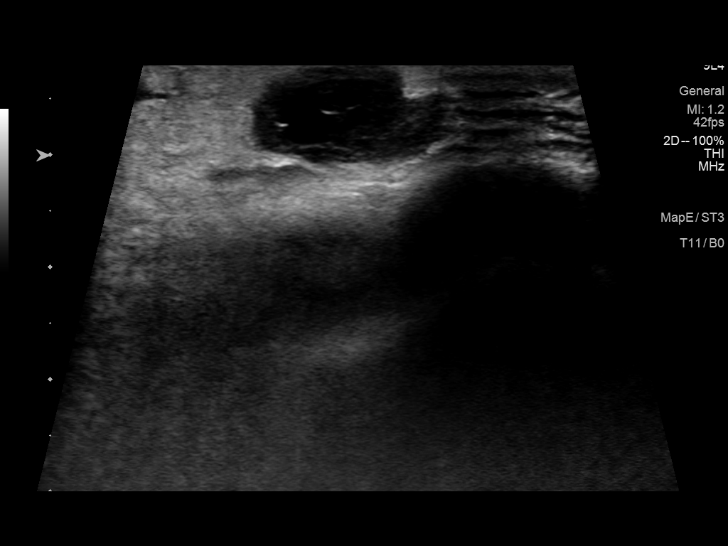
[im 43/48]
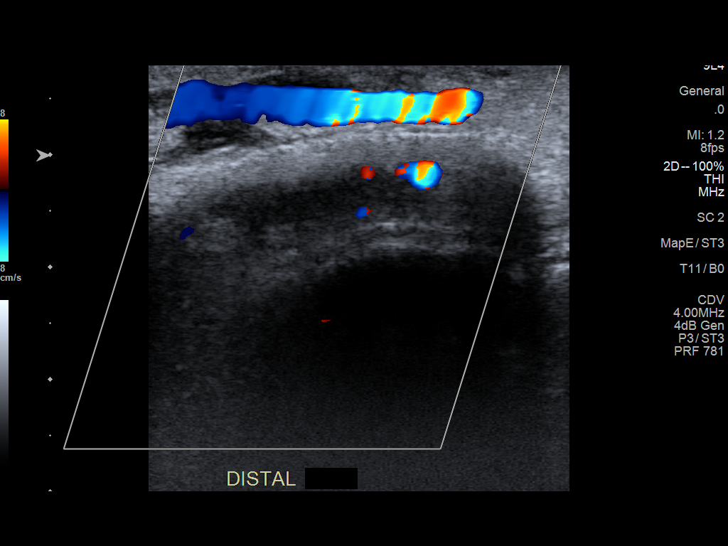
[im 48/48]
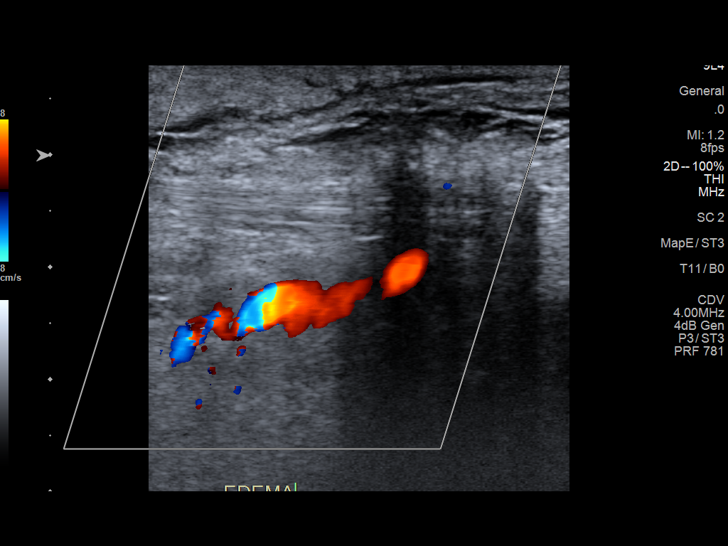

[13 of 24 positions shown; findings below may reference images not displayed]

FINDINGS: VENOUS

Normal compressibility of the common femoral, superficial femoral,
and popliteal veins, as well as the visualized calf veins.
Visualized portions of profunda femoral vein and great saphenous
vein unremarkable. No filling defects to suggest DVT on grayscale or
color Doppler imaging. Doppler waveforms show normal direction of
venous flow, normal respiratory phasicity and response to
augmentation.

Limited views of the contralateral common femoral vein are
unremarkable.

OTHER

0.7 x 1.7 x 1.0 cm complex fluid collection is seen in the left
ankle region which may represent ganglion cyst.

Limitations: none
IMPRESSION: No femoropopliteal DVT nor evidence of DVT within the visualized
calf veins.

If clinical symptoms are inconsistent or if there are persistent or
worsening symptoms, further imaging (possibly involving the iliac
veins) may be warranted.

1.7 cm complex fluid collection in left ankle region which may
represent ganglion cyst.

## 2022-03-04 DIAGNOSIS — E785 Hyperlipidemia, unspecified: Secondary | ICD-10-CM | POA: Diagnosis not present

## 2022-03-04 DIAGNOSIS — E7800 Pure hypercholesterolemia, unspecified: Secondary | ICD-10-CM | POA: Diagnosis not present

## 2022-03-04 DIAGNOSIS — I10 Essential (primary) hypertension: Secondary | ICD-10-CM | POA: Diagnosis not present

## 2022-03-04 DIAGNOSIS — E039 Hypothyroidism, unspecified: Secondary | ICD-10-CM | POA: Diagnosis not present

## 2022-03-11 DIAGNOSIS — E039 Hypothyroidism, unspecified: Secondary | ICD-10-CM | POA: Diagnosis not present

## 2022-03-11 DIAGNOSIS — E7800 Pure hypercholesterolemia, unspecified: Secondary | ICD-10-CM | POA: Diagnosis not present

## 2022-03-11 DIAGNOSIS — N3281 Overactive bladder: Secondary | ICD-10-CM | POA: Diagnosis not present

## 2022-03-11 DIAGNOSIS — Z8673 Personal history of transient ischemic attack (TIA), and cerebral infarction without residual deficits: Secondary | ICD-10-CM | POA: Diagnosis not present

## 2022-03-11 DIAGNOSIS — K219 Gastro-esophageal reflux disease without esophagitis: Secondary | ICD-10-CM | POA: Diagnosis not present

## 2022-03-11 DIAGNOSIS — R413 Other amnesia: Secondary | ICD-10-CM | POA: Diagnosis not present

## 2022-03-11 DIAGNOSIS — I6529 Occlusion and stenosis of unspecified carotid artery: Secondary | ICD-10-CM | POA: Diagnosis not present

## 2022-03-11 DIAGNOSIS — I10 Essential (primary) hypertension: Secondary | ICD-10-CM | POA: Diagnosis not present

## 2022-03-25 ENCOUNTER — Other Ambulatory Visit: Payer: Self-pay

## 2022-03-25 DIAGNOSIS — R040 Epistaxis: Secondary | ICD-10-CM | POA: Diagnosis not present

## 2022-03-25 DIAGNOSIS — R519 Headache, unspecified: Secondary | ICD-10-CM

## 2022-03-25 DIAGNOSIS — W19XXXA Unspecified fall, initial encounter: Secondary | ICD-10-CM | POA: Diagnosis not present

## 2022-04-01 ENCOUNTER — Ambulatory Visit
Admission: RE | Admit: 2022-04-01 | Discharge: 2022-04-01 | Disposition: A | Discharge status: Home / Self Care | Payer: Medicare HMO | Source: Ambulatory Visit

## 2022-04-01 DIAGNOSIS — W19XXXA Unspecified fall, initial encounter: Secondary | ICD-10-CM

## 2022-04-01 DIAGNOSIS — R519 Headache, unspecified: Secondary | ICD-10-CM

## 2022-04-01 DIAGNOSIS — L820 Inflamed seborrheic keratosis: Secondary | ICD-10-CM | POA: Diagnosis not present

## 2022-04-01 DIAGNOSIS — I639 Cerebral infarction, unspecified: Secondary | ICD-10-CM | POA: Diagnosis not present

## 2022-04-01 DIAGNOSIS — L538 Other specified erythematous conditions: Secondary | ICD-10-CM | POA: Diagnosis not present

## 2022-04-01 DIAGNOSIS — R040 Epistaxis: Secondary | ICD-10-CM

## 2022-04-09 DIAGNOSIS — H3561 Retinal hemorrhage, right eye: Secondary | ICD-10-CM | POA: Diagnosis not present

## 2022-04-09 DIAGNOSIS — H353122 Nonexudative age-related macular degeneration, left eye, intermediate dry stage: Secondary | ICD-10-CM | POA: Diagnosis not present

## 2022-04-09 DIAGNOSIS — H35373 Puckering of macula, bilateral: Secondary | ICD-10-CM | POA: Diagnosis not present

## 2022-04-09 DIAGNOSIS — H353124 Nonexudative age-related macular degeneration, left eye, advanced atrophic with subfoveal involvement: Secondary | ICD-10-CM | POA: Diagnosis not present

## 2022-05-16 DIAGNOSIS — Z9989 Dependence on other enabling machines and devices: Secondary | ICD-10-CM | POA: Diagnosis not present

## 2022-05-16 DIAGNOSIS — Z23 Encounter for immunization: Secondary | ICD-10-CM | POA: Diagnosis not present

## 2022-05-16 DIAGNOSIS — R296 Repeated falls: Secondary | ICD-10-CM | POA: Diagnosis not present

## 2022-05-16 DIAGNOSIS — R5381 Other malaise: Secondary | ICD-10-CM | POA: Diagnosis not present

## 2022-06-10 DIAGNOSIS — M1711 Unilateral primary osteoarthritis, right knee: Secondary | ICD-10-CM | POA: Diagnosis not present

## 2022-06-10 DIAGNOSIS — F0280 Dementia in other diseases classified elsewhere without behavioral disturbance: Secondary | ICD-10-CM | POA: Diagnosis not present

## 2022-06-10 DIAGNOSIS — E039 Hypothyroidism, unspecified: Secondary | ICD-10-CM | POA: Diagnosis not present

## 2022-06-10 DIAGNOSIS — I10 Essential (primary) hypertension: Secondary | ICD-10-CM | POA: Diagnosis not present

## 2022-07-07 DIAGNOSIS — H353124 Nonexudative age-related macular degeneration, left eye, advanced atrophic with subfoveal involvement: Secondary | ICD-10-CM | POA: Diagnosis not present

## 2022-07-07 DIAGNOSIS — H353122 Nonexudative age-related macular degeneration, left eye, intermediate dry stage: Secondary | ICD-10-CM | POA: Diagnosis not present

## 2022-07-07 DIAGNOSIS — H3561 Retinal hemorrhage, right eye: Secondary | ICD-10-CM | POA: Diagnosis not present

## 2022-07-07 DIAGNOSIS — H35373 Puckering of macula, bilateral: Secondary | ICD-10-CM | POA: Diagnosis not present

## 2022-09-09 DIAGNOSIS — E039 Hypothyroidism, unspecified: Secondary | ICD-10-CM | POA: Diagnosis not present

## 2022-09-09 DIAGNOSIS — E785 Hyperlipidemia, unspecified: Secondary | ICD-10-CM | POA: Diagnosis not present

## 2022-09-09 DIAGNOSIS — E7800 Pure hypercholesterolemia, unspecified: Secondary | ICD-10-CM | POA: Diagnosis not present

## 2022-09-09 DIAGNOSIS — R413 Other amnesia: Secondary | ICD-10-CM | POA: Diagnosis not present

## 2022-09-09 DIAGNOSIS — I10 Essential (primary) hypertension: Secondary | ICD-10-CM | POA: Diagnosis not present

## 2022-09-16 DIAGNOSIS — Z23 Encounter for immunization: Secondary | ICD-10-CM | POA: Diagnosis not present

## 2022-09-16 DIAGNOSIS — Z8673 Personal history of transient ischemic attack (TIA), and cerebral infarction without residual deficits: Secondary | ICD-10-CM | POA: Diagnosis not present

## 2022-09-16 DIAGNOSIS — N3281 Overactive bladder: Secondary | ICD-10-CM | POA: Diagnosis not present

## 2022-09-16 DIAGNOSIS — R413 Other amnesia: Secondary | ICD-10-CM | POA: Diagnosis not present

## 2022-09-16 DIAGNOSIS — E039 Hypothyroidism, unspecified: Secondary | ICD-10-CM | POA: Diagnosis not present

## 2022-09-16 DIAGNOSIS — I10 Essential (primary) hypertension: Secondary | ICD-10-CM | POA: Diagnosis not present

## 2022-09-16 DIAGNOSIS — K219 Gastro-esophageal reflux disease without esophagitis: Secondary | ICD-10-CM | POA: Diagnosis not present

## 2022-09-16 DIAGNOSIS — I6529 Occlusion and stenosis of unspecified carotid artery: Secondary | ICD-10-CM | POA: Diagnosis not present

## 2022-09-16 DIAGNOSIS — E7800 Pure hypercholesterolemia, unspecified: Secondary | ICD-10-CM | POA: Diagnosis not present

## 2022-11-10 DIAGNOSIS — H35373 Puckering of macula, bilateral: Secondary | ICD-10-CM | POA: Diagnosis not present

## 2022-11-10 DIAGNOSIS — D3131 Benign neoplasm of right choroid: Secondary | ICD-10-CM | POA: Diagnosis not present

## 2022-11-10 DIAGNOSIS — H353124 Nonexudative age-related macular degeneration, left eye, advanced atrophic with subfoveal involvement: Secondary | ICD-10-CM | POA: Diagnosis not present

## 2022-11-10 DIAGNOSIS — H353122 Nonexudative age-related macular degeneration, left eye, intermediate dry stage: Secondary | ICD-10-CM | POA: Diagnosis not present

## 2023-03-10 DIAGNOSIS — Z0000 Encounter for general adult medical examination without abnormal findings: Secondary | ICD-10-CM | POA: Diagnosis not present

## 2023-03-10 DIAGNOSIS — E039 Hypothyroidism, unspecified: Secondary | ICD-10-CM | POA: Diagnosis not present

## 2023-03-10 DIAGNOSIS — N3281 Overactive bladder: Secondary | ICD-10-CM | POA: Diagnosis not present

## 2023-03-10 DIAGNOSIS — E785 Hyperlipidemia, unspecified: Secondary | ICD-10-CM | POA: Diagnosis not present

## 2023-03-10 DIAGNOSIS — F0390 Unspecified dementia without behavioral disturbance: Secondary | ICD-10-CM | POA: Diagnosis not present

## 2023-03-10 DIAGNOSIS — I10 Essential (primary) hypertension: Secondary | ICD-10-CM | POA: Diagnosis not present

## 2023-03-15 DIAGNOSIS — J3489 Other specified disorders of nose and nasal sinuses: Secondary | ICD-10-CM | POA: Diagnosis not present

## 2023-03-15 DIAGNOSIS — R059 Cough, unspecified: Secondary | ICD-10-CM | POA: Diagnosis not present

## 2023-04-08 DIAGNOSIS — Z789 Other specified health status: Secondary | ICD-10-CM | POA: Diagnosis not present

## 2023-04-08 DIAGNOSIS — E7800 Pure hypercholesterolemia, unspecified: Secondary | ICD-10-CM | POA: Diagnosis not present

## 2023-04-08 DIAGNOSIS — Z0000 Encounter for general adult medical examination without abnormal findings: Secondary | ICD-10-CM | POA: Diagnosis not present

## 2023-04-08 DIAGNOSIS — I10 Essential (primary) hypertension: Secondary | ICD-10-CM | POA: Diagnosis not present

## 2023-04-08 DIAGNOSIS — Z8673 Personal history of transient ischemic attack (TIA), and cerebral infarction without residual deficits: Secondary | ICD-10-CM | POA: Diagnosis not present

## 2023-04-08 DIAGNOSIS — E039 Hypothyroidism, unspecified: Secondary | ICD-10-CM | POA: Diagnosis not present

## 2023-04-08 DIAGNOSIS — K219 Gastro-esophageal reflux disease without esophagitis: Secondary | ICD-10-CM | POA: Diagnosis not present

## 2023-04-08 DIAGNOSIS — N3281 Overactive bladder: Secondary | ICD-10-CM | POA: Diagnosis not present

## 2023-04-08 DIAGNOSIS — R413 Other amnesia: Secondary | ICD-10-CM | POA: Diagnosis not present

## 2023-04-08 DIAGNOSIS — I6529 Occlusion and stenosis of unspecified carotid artery: Secondary | ICD-10-CM | POA: Diagnosis not present

## 2023-04-08 DIAGNOSIS — R2689 Other abnormalities of gait and mobility: Secondary | ICD-10-CM | POA: Diagnosis not present

## 2023-04-11 DIAGNOSIS — E039 Hypothyroidism, unspecified: Secondary | ICD-10-CM | POA: Diagnosis not present

## 2023-04-11 DIAGNOSIS — N3281 Overactive bladder: Secondary | ICD-10-CM | POA: Diagnosis not present

## 2023-04-11 DIAGNOSIS — K449 Diaphragmatic hernia without obstruction or gangrene: Secondary | ICD-10-CM | POA: Diagnosis not present

## 2023-04-11 DIAGNOSIS — Z79899 Other long term (current) drug therapy: Secondary | ICD-10-CM | POA: Diagnosis not present

## 2023-04-11 DIAGNOSIS — I10 Essential (primary) hypertension: Secondary | ICD-10-CM | POA: Diagnosis not present

## 2023-04-11 DIAGNOSIS — F0280 Dementia in other diseases classified elsewhere without behavioral disturbance: Secondary | ICD-10-CM | POA: Diagnosis not present

## 2023-04-11 DIAGNOSIS — G309 Alzheimer's disease, unspecified: Secondary | ICD-10-CM | POA: Diagnosis not present

## 2023-04-11 DIAGNOSIS — R7303 Prediabetes: Secondary | ICD-10-CM | POA: Diagnosis not present

## 2023-04-11 DIAGNOSIS — E079 Disorder of thyroid, unspecified: Secondary | ICD-10-CM | POA: Diagnosis not present

## 2023-04-11 DIAGNOSIS — Z556 Problems related to health literacy: Secondary | ICD-10-CM | POA: Diagnosis not present

## 2023-04-11 DIAGNOSIS — I6529 Occlusion and stenosis of unspecified carotid artery: Secondary | ICD-10-CM | POA: Diagnosis not present

## 2023-04-11 DIAGNOSIS — E7800 Pure hypercholesterolemia, unspecified: Secondary | ICD-10-CM | POA: Diagnosis not present

## 2023-04-11 DIAGNOSIS — H3530 Unspecified macular degeneration: Secondary | ICD-10-CM | POA: Diagnosis not present

## 2023-04-11 DIAGNOSIS — Z9181 History of falling: Secondary | ICD-10-CM | POA: Diagnosis not present

## 2023-04-11 DIAGNOSIS — Z7982 Long term (current) use of aspirin: Secondary | ICD-10-CM | POA: Diagnosis not present

## 2023-04-15 DIAGNOSIS — E7800 Pure hypercholesterolemia, unspecified: Secondary | ICD-10-CM | POA: Diagnosis not present

## 2023-04-15 DIAGNOSIS — E039 Hypothyroidism, unspecified: Secondary | ICD-10-CM | POA: Diagnosis not present

## 2023-04-15 DIAGNOSIS — I10 Essential (primary) hypertension: Secondary | ICD-10-CM | POA: Diagnosis not present

## 2023-04-15 DIAGNOSIS — I6529 Occlusion and stenosis of unspecified carotid artery: Secondary | ICD-10-CM | POA: Diagnosis not present

## 2023-05-13 DIAGNOSIS — D3131 Benign neoplasm of right choroid: Secondary | ICD-10-CM | POA: Diagnosis not present

## 2023-05-13 DIAGNOSIS — H35373 Puckering of macula, bilateral: Secondary | ICD-10-CM | POA: Diagnosis not present

## 2023-05-13 DIAGNOSIS — H353124 Nonexudative age-related macular degeneration, left eye, advanced atrophic with subfoveal involvement: Secondary | ICD-10-CM | POA: Diagnosis not present

## 2023-05-13 DIAGNOSIS — H353122 Nonexudative age-related macular degeneration, left eye, intermediate dry stage: Secondary | ICD-10-CM | POA: Diagnosis not present

## 2023-05-13 DIAGNOSIS — Z961 Presence of intraocular lens: Secondary | ICD-10-CM | POA: Diagnosis not present

## 2023-05-20 DIAGNOSIS — G894 Chronic pain syndrome: Secondary | ICD-10-CM | POA: Diagnosis not present

## 2023-05-20 DIAGNOSIS — F322 Major depressive disorder, single episode, severe without psychotic features: Secondary | ICD-10-CM | POA: Diagnosis not present

## 2023-05-20 DIAGNOSIS — E038 Other specified hypothyroidism: Secondary | ICD-10-CM | POA: Diagnosis not present

## 2023-05-20 DIAGNOSIS — Z23 Encounter for immunization: Secondary | ICD-10-CM | POA: Diagnosis not present

## 2023-05-20 DIAGNOSIS — M25552 Pain in left hip: Secondary | ICD-10-CM | POA: Diagnosis not present

## 2023-05-20 DIAGNOSIS — R339 Retention of urine, unspecified: Secondary | ICD-10-CM | POA: Diagnosis not present

## 2023-05-20 DIAGNOSIS — E039 Hypothyroidism, unspecified: Secondary | ICD-10-CM | POA: Diagnosis not present

## 2023-06-15 DIAGNOSIS — I10 Essential (primary) hypertension: Secondary | ICD-10-CM | POA: Diagnosis not present

## 2023-06-15 DIAGNOSIS — G309 Alzheimer's disease, unspecified: Secondary | ICD-10-CM | POA: Diagnosis not present

## 2023-06-15 DIAGNOSIS — E7800 Pure hypercholesterolemia, unspecified: Secondary | ICD-10-CM | POA: Diagnosis not present

## 2023-06-15 DIAGNOSIS — F0280 Dementia in other diseases classified elsewhere without behavioral disturbance: Secondary | ICD-10-CM | POA: Diagnosis not present

## 2023-06-16 DIAGNOSIS — M25531 Pain in right wrist: Secondary | ICD-10-CM | POA: Diagnosis not present

## 2023-06-16 DIAGNOSIS — F322 Major depressive disorder, single episode, severe without psychotic features: Secondary | ICD-10-CM | POA: Diagnosis not present

## 2023-06-16 DIAGNOSIS — R339 Retention of urine, unspecified: Secondary | ICD-10-CM | POA: Diagnosis not present

## 2023-10-12 DIAGNOSIS — E7800 Pure hypercholesterolemia, unspecified: Secondary | ICD-10-CM | POA: Diagnosis not present

## 2023-10-12 DIAGNOSIS — I10 Essential (primary) hypertension: Secondary | ICD-10-CM | POA: Diagnosis not present

## 2023-10-12 DIAGNOSIS — N3281 Overactive bladder: Secondary | ICD-10-CM | POA: Diagnosis not present

## 2023-10-12 DIAGNOSIS — R413 Other amnesia: Secondary | ICD-10-CM | POA: Diagnosis not present

## 2023-10-12 DIAGNOSIS — E039 Hypothyroidism, unspecified: Secondary | ICD-10-CM | POA: Diagnosis not present

## 2023-10-19 DIAGNOSIS — I10 Essential (primary) hypertension: Secondary | ICD-10-CM | POA: Diagnosis not present

## 2023-10-19 DIAGNOSIS — R413 Other amnesia: Secondary | ICD-10-CM | POA: Diagnosis not present

## 2023-10-19 DIAGNOSIS — I6529 Occlusion and stenosis of unspecified carotid artery: Secondary | ICD-10-CM | POA: Diagnosis not present

## 2023-10-19 DIAGNOSIS — K219 Gastro-esophageal reflux disease without esophagitis: Secondary | ICD-10-CM | POA: Diagnosis not present

## 2023-10-19 DIAGNOSIS — F322 Major depressive disorder, single episode, severe without psychotic features: Secondary | ICD-10-CM | POA: Diagnosis not present

## 2023-10-19 DIAGNOSIS — Z789 Other specified health status: Secondary | ICD-10-CM | POA: Diagnosis not present

## 2023-10-19 DIAGNOSIS — E7800 Pure hypercholesterolemia, unspecified: Secondary | ICD-10-CM | POA: Diagnosis not present

## 2023-10-19 DIAGNOSIS — E039 Hypothyroidism, unspecified: Secondary | ICD-10-CM | POA: Diagnosis not present

## 2023-10-19 DIAGNOSIS — R531 Weakness: Secondary | ICD-10-CM | POA: Diagnosis not present

## 2023-10-19 DIAGNOSIS — Z8673 Personal history of transient ischemic attack (TIA), and cerebral infarction without residual deficits: Secondary | ICD-10-CM | POA: Diagnosis not present

## 2023-10-19 DIAGNOSIS — R339 Retention of urine, unspecified: Secondary | ICD-10-CM | POA: Diagnosis not present

## 2023-10-19 DIAGNOSIS — R2689 Other abnormalities of gait and mobility: Secondary | ICD-10-CM | POA: Diagnosis not present

## 2023-10-22 DIAGNOSIS — M25531 Pain in right wrist: Secondary | ICD-10-CM | POA: Diagnosis not present

## 2023-10-22 DIAGNOSIS — I10 Essential (primary) hypertension: Secondary | ICD-10-CM | POA: Diagnosis not present

## 2023-10-22 DIAGNOSIS — E039 Hypothyroidism, unspecified: Secondary | ICD-10-CM | POA: Diagnosis not present

## 2023-10-22 DIAGNOSIS — K219 Gastro-esophageal reflux disease without esophagitis: Secondary | ICD-10-CM | POA: Diagnosis not present

## 2023-10-22 DIAGNOSIS — R419 Unspecified symptoms and signs involving cognitive functions and awareness: Secondary | ICD-10-CM | POA: Diagnosis not present

## 2023-10-22 DIAGNOSIS — E7800 Pure hypercholesterolemia, unspecified: Secondary | ICD-10-CM | POA: Diagnosis not present

## 2023-10-22 DIAGNOSIS — Z9181 History of falling: Secondary | ICD-10-CM | POA: Diagnosis not present

## 2023-10-22 DIAGNOSIS — F322 Major depressive disorder, single episode, severe without psychotic features: Secondary | ICD-10-CM | POA: Diagnosis not present

## 2023-10-22 DIAGNOSIS — Z556 Problems related to health literacy: Secondary | ICD-10-CM | POA: Diagnosis not present

## 2023-10-22 DIAGNOSIS — K449 Diaphragmatic hernia without obstruction or gangrene: Secondary | ICD-10-CM | POA: Diagnosis not present

## 2023-10-22 DIAGNOSIS — K5790 Diverticulosis of intestine, part unspecified, without perforation or abscess without bleeding: Secondary | ICD-10-CM | POA: Diagnosis not present

## 2023-10-22 DIAGNOSIS — Z8673 Personal history of transient ischemic attack (TIA), and cerebral infarction without residual deficits: Secondary | ICD-10-CM | POA: Diagnosis not present

## 2023-10-22 DIAGNOSIS — R339 Retention of urine, unspecified: Secondary | ICD-10-CM | POA: Diagnosis not present

## 2023-10-22 DIAGNOSIS — R7303 Prediabetes: Secondary | ICD-10-CM | POA: Diagnosis not present

## 2023-10-22 DIAGNOSIS — Z7982 Long term (current) use of aspirin: Secondary | ICD-10-CM | POA: Diagnosis not present

## 2023-10-22 DIAGNOSIS — R531 Weakness: Secondary | ICD-10-CM | POA: Diagnosis not present

## 2023-10-22 DIAGNOSIS — N3281 Overactive bladder: Secondary | ICD-10-CM | POA: Diagnosis not present

## 2023-10-22 DIAGNOSIS — I6521 Occlusion and stenosis of right carotid artery: Secondary | ICD-10-CM | POA: Diagnosis not present

## 2023-10-22 DIAGNOSIS — H3530 Unspecified macular degeneration: Secondary | ICD-10-CM | POA: Diagnosis not present

## 2023-10-29 DIAGNOSIS — R531 Weakness: Secondary | ICD-10-CM | POA: Diagnosis not present

## 2023-10-29 DIAGNOSIS — R339 Retention of urine, unspecified: Secondary | ICD-10-CM | POA: Diagnosis not present

## 2023-10-29 DIAGNOSIS — I10 Essential (primary) hypertension: Secondary | ICD-10-CM | POA: Diagnosis not present

## 2023-10-29 DIAGNOSIS — F322 Major depressive disorder, single episode, severe without psychotic features: Secondary | ICD-10-CM | POA: Diagnosis not present

## 2023-11-11 DIAGNOSIS — H353122 Nonexudative age-related macular degeneration, left eye, intermediate dry stage: Secondary | ICD-10-CM | POA: Diagnosis not present

## 2023-11-11 DIAGNOSIS — H353124 Nonexudative age-related macular degeneration, left eye, advanced atrophic with subfoveal involvement: Secondary | ICD-10-CM | POA: Diagnosis not present

## 2023-11-11 DIAGNOSIS — H35373 Puckering of macula, bilateral: Secondary | ICD-10-CM | POA: Diagnosis not present

## 2023-11-12 DIAGNOSIS — M25552 Pain in left hip: Secondary | ICD-10-CM | POA: Diagnosis not present

## 2023-11-12 DIAGNOSIS — Z96652 Presence of left artificial knee joint: Secondary | ICD-10-CM | POA: Diagnosis not present

## 2023-11-12 DIAGNOSIS — M25562 Pain in left knee: Secondary | ICD-10-CM | POA: Diagnosis not present

## 2023-12-18 DIAGNOSIS — D225 Melanocytic nevi of trunk: Secondary | ICD-10-CM | POA: Diagnosis not present

## 2023-12-18 DIAGNOSIS — L814 Other melanin hyperpigmentation: Secondary | ICD-10-CM | POA: Diagnosis not present

## 2023-12-18 DIAGNOSIS — R58 Hemorrhage, not elsewhere classified: Secondary | ICD-10-CM | POA: Diagnosis not present

## 2023-12-18 DIAGNOSIS — L820 Inflamed seborrheic keratosis: Secondary | ICD-10-CM | POA: Diagnosis not present

## 2023-12-18 DIAGNOSIS — M6740 Ganglion, unspecified site: Secondary | ICD-10-CM | POA: Diagnosis not present

## 2023-12-18 DIAGNOSIS — L538 Other specified erythematous conditions: Secondary | ICD-10-CM | POA: Diagnosis not present

## 2023-12-18 DIAGNOSIS — L821 Other seborrheic keratosis: Secondary | ICD-10-CM | POA: Diagnosis not present

## 2024-01-18 NOTE — Patient Instructions (Signed)
 SURGICAL WAITING ROOM VISITATION  Patients having surgery or a procedure may have no more than 2 support people in the waiting area - these visitors may rotate.    Children under the age of 82 must have an adult with them who is not the patient.  Visitors with respiratory illnesses are discouraged from visiting and should remain at home.  If the patient needs to stay at the hospital during part of their recovery, the visitor guidelines for inpatient rooms apply. Pre-op nurse will coordinate an appropriate time for 1 support person to accompany patient in pre-op.  This support person may not rotate.    Please refer to the [facility] website for the visitor guidelines for Inpatients (after your surgery is over and you are in a regular room).       Your procedure is scheduled on: 01/26/24   Report to [facility] Main Entrance    Report to admitting at : 8:30 AM   Call this number if you have problems the morning of surgery [phone]   Do not eat food :After Midnight.   After Midnight you may have the following liquids until : 8:00 AM DAY OF SURGERY  Water  Non-Citrus Juices (without pulp, NO RED-Apple, White grape, White cranberry) Black Coffee (NO MILK/CREAM OR CREAMERS, sugar ok)  Clear Tea (NO MILK/CREAM OR CREAMERS, sugar ok) regular and decaf                             Plain Jell-O (NO RED)                                           Fruit ices (not with fruit pulp, NO RED)                                     Popsicles (NO RED)                                                               Sports drinks like Gatorade (NO RED)   The day of surgery:  Drink ONE (1) Pre-Surgery Clear Ensure at : 8:00 AM the morning of surgery. Drink in one sitting. Do not sip.  This drink was given to you during your hospital  pre-op appointment visit. Nothing else to drink after completing the  Pre-Surgery Clear Ensure or G2.          If you have questions, please contact your  surgeon's office.  FOLLOW ANY ADDITIONAL PRE OP INSTRUCTIONS YOU RECEIVED FROM YOUR SURGEON'S OFFICE!!!   Oral Hygiene is also important to reduce your risk of infection.                                    Remember - BRUSH YOUR TEETH THE MORNING OF SURGERY WITH YOUR REGULAR TOOTHPASTE  DENTURES WILL BE REMOVED PRIOR TO SURGERY PLEASE DO NOT APPLY Poly grip OR ADHESIVES!!!   Do NOT smoke after Midnight   Stop all  vitamins and herbal supplements 7 days before surgery.   Take these medicines the morning of surgery with A SIP OF WATER :levothyroxine ,omeprazole .Cetirizine as needed.                  You may not have any metal on your body including hair pins, jewelry, and body piercing             Do not wear make-up, lotions, powders, perfumes/cologne, or deodorant  Do not wear nail polish including gel and S&S, artificial/acrylic nails, or any other type of covering on natural nails including finger and toenails. If you have artificial nails, gel coating, etc. that needs to be removed by a nail salon please have this removed prior to surgery or surgery may need to be canceled/ delayed if the surgeon/ anesthesia feels like they are unable to be safely monitored.   Do not shave  48 hours prior to surgery.    Do not bring valuables to the hospital. [facility] IS NOT             RESPONSIBLE   FOR VALUABLES.   Contacts, glasses, dentures or bridgework may not be worn into surgery.   Bring small overnight bag day of surgery.   DO NOT BRING YOUR HOME MEDICATIONS TO THE HOSPITAL. PHARMACY WILL DISPENSE MEDICATIONS LISTED ON YOUR MEDICATION LIST TO YOU DURING YOUR ADMISSION IN THE HOSPITAL!    Patients discharged on the day of surgery will not be allowed to drive home.  Someone NEEDS to stay with you for the first 24 hours after anesthesia.   Special Instructions: Bring a copy of your healthcare power of attorney and living will documents the day of surgery if you haven't scanned them  before.              Please read over the following fact sheets you were given: IF YOU HAVE QUESTIONS ABOUT YOUR PRE-OP INSTRUCTIONS PLEASE CALL [phone]   If you received a COVID test during your pre-op visit  it is requested that you wear a mask when out in public, stay away from anyone that may not be feeling well and notify your surgeon if you develop symptoms. If you test positive for Covid or have been in contact with anyone that has tested positive in the last 10 days please notify you surgeon.      Pre-operative 5 CHG Bath Instructions   You can play a key role in reducing the risk of infection after surgery. Your skin needs to be as free of germs as possible. You can reduce the number of germs on your skin by washing with CHG (chlorhexidine  gluconate) soap before surgery. CHG is an antiseptic soap that kills germs and continues to kill germs even after washing.   DO NOT use if you have an allergy to chlorhexidine /CHG or antibacterial soaps. If your skin becomes reddened or irritated, stop using the CHG and notify one of our RNs at [phone].   Please shower with the CHG soap starting 4 days before surgery using the following schedule:     Please keep in mind the following:  DO NOT shave, including legs and underarms, starting the day of your first shower.   You may shave your face at any point before/day of surgery.  Place clean sheets on your bed the day you start using CHG soap. Use a clean washcloth (not used since being washed) for each shower. DO NOT sleep with pets once you start using the CHG.  CHG Shower Instructions:  If you choose to wash your hair and private area, wash first with your normal shampoo/soap.  After you use shampoo/soap, rinse your hair and body thoroughly to remove shampoo/soap residue.  Turn the water  OFF and apply about 3 tablespoons (45 ml) of CHG soap to a CLEAN washcloth.  Apply CHG soap ONLY FROM YOUR NECK DOWN TO YOUR TOES (washing for  3-5 minutes)  DO NOT use CHG soap on face, private areas, open wounds, or sores.  Pay special attention to the area where your surgery is being performed.  If you are having back surgery, having someone wash your back for you may be helpful. Wait 2 minutes after CHG soap is applied, then you may rinse off the CHG soap.  Pat dry with a clean towel  Put on clean clothes/pajamas   If you choose to wear lotion, please use ONLY the CHG-compatible lotions on the back of this paper.     Additional instructions for the day of surgery: DO NOT APPLY any lotions, deodorants, cologne, or perfumes.   Put on clean/comfortable clothes.  Brush your teeth.  Ask your nurse before applying any prescription medications to the skin.   CHG Compatible Lotions   Aveeno Moisturizing lotion  Cetaphil Moisturizing Cream  Cetaphil Moisturizing Lotion  Clairol Herbal Essence Moisturizing Lotion, Dry Skin  Clairol Herbal Essence Moisturizing Lotion, Extra Dry Skin  Clairol Herbal Essence Moisturizing Lotion, Normal Skin  Curel Age Defying Therapeutic Moisturizing Lotion with Alpha Hydroxy  Curel Extreme Care Body Lotion  Curel Soothing Hands Moisturizing Hand Lotion  Curel Therapeutic Moisturizing Cream, Fragrance-Free  Curel Therapeutic Moisturizing Lotion, Fragrance-Free  Curel Therapeutic Moisturizing Lotion, Original Formula  Eucerin Daily Replenishing Lotion  Eucerin Dry Skin Therapy Plus Alpha Hydroxy Crme  Eucerin Dry Skin Therapy Plus Alpha Hydroxy Lotion  Eucerin Original Crme  Eucerin Original Lotion  Eucerin Plus Crme Eucerin Plus Lotion  Eucerin TriLipid Replenishing Lotion  Keri Anti-Bacterial Hand Lotion  Keri Deep Conditioning Original Lotion Dry Skin Formula Softly Scented  Keri Deep Conditioning Original Lotion, Fragrance Free Sensitive Skin Formula  Keri Lotion Fast Absorbing Fragrance Free Sensitive Skin Formula  Keri Lotion Fast Absorbing Softly Scented Dry Skin Formula  Keri  Original Lotion  Keri Skin Renewal Lotion Keri Silky Smooth Lotion  Keri Silky Smooth Sensitive Skin Lotion  Nivea Body Creamy Conditioning Oil  Nivea Body Extra Enriched Lotion  Nivea Body Original Lotion  Nivea Body Sheer Moisturizing Lotion Nivea Crme  Nivea Skin Firming Lotion  NutraDerm 30 Skin Lotion  NutraDerm Skin Lotion  NutraDerm Therapeutic Skin Cream  NutraDerm Therapeutic Skin Lotion  ProShield Protective Hand Cream  Provon moisturizing lotion   Incentive Spirometer  An incentive spirometer is a tool that can help keep your lungs clear and active. This tool measures how well you are filling your lungs with each breath. Taking long deep breaths may help reverse or decrease the chance of developing breathing (pulmonary) problems (especially infection) following: A long period of time when you are unable to move or be active. BEFORE THE PROCEDURE  If the spirometer includes an indicator to show your best effort, your nurse or respiratory therapist will set it to a desired goal. If possible, sit up straight or lean slightly forward. Try not to slouch. Hold the incentive spirometer in an upright position. INSTRUCTIONS FOR USE  Sit on the edge of your bed if possible, or sit up as far as you can in bed or on a chair.  Hold the incentive spirometer in an upright position. Breathe out normally. Place the mouthpiece in your mouth and seal your lips tightly around it. Breathe in slowly and as deeply as possible, raising the piston or the ball toward the top of the column. Hold your breath for 3-5 seconds or for as long as possible. Allow the piston or ball to fall to the bottom of the column. Remove the mouthpiece from your mouth and breathe out normally. Rest for a few seconds and repeat Steps 1 through 7 at least 10 times every 1-2 hours when you are awake. Take your time and take a few normal breaths between deep breaths. The spirometer may include an indicator to show your best  effort. Use the indicator as a goal to work toward during each repetition. After each set of 10 deep breaths, practice coughing to be sure your lungs are clear. If you have an incision (the cut made at the time of surgery), support your incision when coughing by placing a pillow or rolled up towels firmly against it. Once you are able to get out of bed, walk around indoors and cough well. You may stop using the incentive spirometer when instructed by your caregiver.  RISKS AND COMPLICATIONS Take your time so you do not get dizzy or light-headed. If you are in pain, you may need to take or ask for pain medication before doing incentive spirometry. It is harder to take a deep breath if you are having pain. AFTER USE Rest and breathe slowly and easily. It can be helpful to keep track of a log of your progress. Your caregiver can provide you with a simple table to help with this. If you are using the spirometer at home, follow these instructions: SEEK MEDICAL CARE IF:  You are having difficultly using the spirometer. You have trouble using the spirometer as often as instructed. Your pain medication is not giving enough relief while using the spirometer. You develop fever of 100.5 F (38.1 C) or higher. SEEK IMMEDIATE MEDICAL CARE IF:  You cough up bloody sputum that had not been present before. You develop fever of 102 F (38.9 C) or greater. You develop worsening pain at or near the incision site. MAKE SURE YOU:  Understand these instructions. Will watch your condition. Will get help right away if you are not doing well or get worse. Document Released: 12/01/2006 Document Revised: 10/13/2011 Document Reviewed: 02/01/2007 [facility] Patient Information 2014 [place], [place].   ________________________________________________________________________

## 2024-01-19 ENCOUNTER — Encounter (HOSPITAL_COMMUNITY)
Admission: RE | Admit: 2024-01-19 | Discharge: 2024-01-19 | Disposition: A | Discharge status: Home / Self Care | Source: Ambulatory Visit | Attending: Orthopedic Surgery

## 2024-01-19 ENCOUNTER — Encounter (HOSPITAL_COMMUNITY): Payer: Self-pay

## 2024-01-19 ENCOUNTER — Other Ambulatory Visit: Payer: Self-pay

## 2024-01-19 VITALS — BP 166/61 | HR 78 | Temp 97.5°F | Ht 61.5 in | Wt 171.0 lb

## 2024-01-19 DIAGNOSIS — Z01810 Encounter for preprocedural cardiovascular examination: Secondary | ICD-10-CM | POA: Diagnosis present

## 2024-01-19 DIAGNOSIS — R9431 Abnormal electrocardiogram [ECG] [EKG]: Secondary | ICD-10-CM | POA: Diagnosis not present

## 2024-01-19 DIAGNOSIS — Z01818 Encounter for other preprocedural examination: Secondary | ICD-10-CM | POA: Diagnosis not present

## 2024-01-19 DIAGNOSIS — M1612 Unilateral primary osteoarthritis, left hip: Secondary | ICD-10-CM | POA: Insufficient documentation

## 2024-01-19 DIAGNOSIS — I2510 Atherosclerotic heart disease of native coronary artery without angina pectoris: Secondary | ICD-10-CM | POA: Diagnosis not present

## 2024-01-19 DIAGNOSIS — Z01812 Encounter for preprocedural laboratory examination: Secondary | ICD-10-CM | POA: Diagnosis present

## 2024-01-19 HISTORY — DX: Unspecified macular degeneration: H35.30

## 2024-01-19 HISTORY — DX: Depression, unspecified: F32.A

## 2024-01-19 HISTORY — DX: Dyspnea, unspecified: R06.00

## 2024-01-19 LAB — CBC
HCT: 42.5 % (ref 36.0–46.0)
Hemoglobin: 13.9 g/dL (ref 12.0–15.0)
MCH: 31.8 pg (ref 26.0–34.0)
MCHC: 32.7 g/dL (ref 30.0–36.0)
MCV: 97.3 fL (ref 80.0–100.0)
Platelets: 295 10*3/uL (ref 150–400)
RBC: 4.37 MIL/uL (ref 3.87–5.11)
RDW: 14.1 % (ref 11.5–15.5)
WBC: 8.6 10*3/uL (ref 4.0–10.5)
nRBC: 0 % (ref 0.0–0.2)

## 2024-01-19 LAB — BASIC METABOLIC PANEL WITH GFR
Anion gap: 9 (ref 5–15)
BUN: 16 mg/dL (ref 8–23)
CO2: 24 mmol/L (ref 22–32)
Calcium: 8.9 mg/dL (ref 8.9–10.3)
Chloride: 107 mmol/L (ref 98–111)
Creatinine, Ser: 1.36 mg/dL — ABNORMAL HIGH (ref 0.44–1.00)
GFR, Estimated: 38 mL/min — ABNORMAL LOW (ref >60)
Glucose, Bld: 111 mg/dL — ABNORMAL HIGH (ref 70–99)
Potassium: 3.5 mmol/L (ref 3.5–5.1)
Sodium: 140 mmol/L (ref 135–145)

## 2024-01-19 LAB — TYPE AND SCREEN

## 2024-01-19 NOTE — Progress Notes (Addendum)
 For Anesthesia: PCP - Yvonnie Heritage: PA: LOV: 10/29/23 Cardiologist - Dr. Lauro Portal. LOV: 10/25/14  Bowel Prep reminder:  Chest x-ray -  EKG - 01/19/24 Stress Test -  ECHO -  Cardiac Cath -  Pacemaker/ICD device last checked: Pacemaker orders received: Device Rep notified:  Spinal Cord Stimulator:N/A  Sleep Study - N/A CPAP -   Fasting Blood Sugar - N/A Checks Blood Sugar _____ times a day Date and result of last Hgb A1c-  Last dose of GLP1 agonist- N/A GLP1 instructions:   Last dose of SGLT-2 inhibitors- N/A SGLT-2 instructions:   Blood Thinner Instructions: Aspirin  Instructions: To hold it 5 days before surgery. Last Dose:  Activity level: Can go up a flight of stairs and activities of daily living without stopping and without chest pain and/or shortness of breath Unable to go up a flight of stairs without shortness of breath    Anesthesia review: Hx: CAD,Pre-DIA,SOB,HTN.  Patient denies shortness of breath, fever, cough and chest pain at PAT appointment   Patient verbalized understanding of instructions that were reviewed over the telephone.

## 2024-01-20 LAB — SURGICAL PCR SCREEN
MRSA, PCR: NEGATIVE
Staphylococcus aureus: POSITIVE — AB

## 2024-01-20 NOTE — Progress Notes (Signed)
PCR: + STAPH °

## 2024-01-20 NOTE — Progress Notes (Signed)
 Anesthesia Chart Review   Case: [id number] Date/Time: 01/26/24 1045   Procedure: ARTHROPLASTY, HIP, TOTAL, ANTERIOR APPROACH (Left: Hip)   Anesthesia type: Spinal   Diagnosis: Primary osteoarthritis of left hip [M16.12]   Pre-op diagnosis: Left hip osteoarthritis   Location: [facility] ROOM 10 / [facility]   Surgeons: Claiborne Crew, MD       DISCUSSION:87 y.o. never smoker with h/o hypothyroidism, carotid artery disease, CAD, left hip OA scheduled for above procedure 01/26/24 with Dr. Claiborne Crew.   Carotid Ultrasound 2016 normal study.    VS: BP (!) 166/61   Pulse 78   Temp (!) 36.4 C (Oral)   Ht 5' 1.5 (1.562 m)   Wt 77.6 kg   SpO2 100%   BMI 31.79 kg/m   PROVIDERS: Vanita Gens, MD (Inactive)   LABS: Labs reviewed: Acceptable for surgery. (all labs ordered are listed, but only abnormal results are displayed)  Labs Reviewed  SURGICAL PCR SCREEN - Abnormal; Notable for the following components:      Result Value   Staphylococcus aureus POSITIVE (*)    All other components within normal limits  BASIC METABOLIC PANEL WITH GFR - Abnormal; Notable for the following components:   Glucose, Bld 111 (*)    Creatinine, Ser 1.36 (*)    GFR, Estimated 38 (*)    All other components within normal limits  CBC  TYPE AND SCREEN     IMAGES:   EKG:   CV:  Past Medical History:  Diagnosis Date   Acid reflux    Arthritis    Carotid artery disease (HCC)    50-70% left internal carotid artery stenosis   Coronary artery disease    Depression    Dyspnea    Hypothyroidism    Macular degeneration    Thyroid  condition    Visual changes     Past Surgical History:  Procedure Laterality Date   CATARACT EXTRACTION, BILATERAL     CHOLECYSTECTOMY     COLONOSCOPY     ESOPHAGOGASTRODUODENOSCOPY ENDOSCOPY     TOTAL KNEE ARTHROPLASTY Left 06/26/2020   Procedure: TOTAL KNEE ARTHROPLASTY;  Surgeon: Claiborne Crew, MD;  Location: [facility];  Service: Orthopedics;  Laterality: Left;  70 mins     MEDICATIONS:  acetaminophen  (TYLENOL ) 500 MG tablet   aspirin  EC 81 MG tablet   Calcium -Vitamin D 600-200 MG-UNIT per tablet   cetirizine (ZYRTEC) 10 MG tablet   Docusate Calcium  (STOOL SOFTENER PO)   ezetimibe (ZETIA) 10 MG tablet   furosemide  (LASIX ) 40 MG tablet   glucosamine-chondroitin 500-400 MG tablet   ibuprofen (ADVIL) 200 MG tablet   levothyroxine  (SYNTHROID ) 100 MCG tablet   memantine  (NAMENDA ) 5 MG tablet   mirtazapine (REMERON) 15 MG tablet   Multiple Vitamin (MULTIVITAMIN WITH MINERALS) TABS tablet   Multiple Vitamins-Minerals (ICAPS AREDS 2 PO)   omeprazole  (PRILOSEC) 40 MG capsule   potassium chloride  (KLOR-CON  M) 10 MEQ tablet   No current facility-administered medications for this encounter.

## 2024-01-21 NOTE — H&P (Cosign Needed)
 TOTAL HIP ADMISSION H&P  Patient is admitted for left total hip arthroplasty.  Therapy Plans: HHPT Disposition: Home with daughter Planned DVT Prophylaxis: aspirin  81mg  BID DME needed: none PCP: Yvonnie Heritage - clearance received TXA: Allergies: NKDA Anesthesia Concerns: none BMI: 31.7 Last HgbA1c: Not diabetic   Other: - HHPT through [facility] - memory changes takes namenda  - MINIMAL pain meds - tramadol - tylenol , celebrex , robaxin    Subjective:  Chief Complaint: left hip pain  HPI: Donna Petty, 88 y.o. female, has a history of pain and functional disability in the left hip(s) due to arthritis and patient has failed non-surgical conservative treatments for greater than 12 weeks to include NSAID's and/or analgesics and activity modification.  Onset of symptoms was gradual starting 2 years ago with gradually worsening course since that time.The patient noted no past surgery on the left hip(s).  Patient currently rates pain in the left hip at 8 out of 10 with activity. Patient has worsening of pain with activity and weight bearing, pain that interfers with activities of daily living, and pain with passive range of motion. Patient has evidence of joint space narrowing by imaging studies. This condition presents safety issues increasing the risk of falls. There is no current active infection.  Patient Active Problem List   Diagnosis Date Noted   Obese 06/27/2020   Left knee OA 06/26/2020   S/P left TKA 06/26/2020   Left-sided carotid artery disease (HCC) 04/27/2014   Past Medical History:  Diagnosis Date   Acid reflux    Arthritis    Carotid artery disease (HCC)    50-70% left internal carotid artery stenosis   Coronary artery disease    Depression    Dyspnea    Hypothyroidism    Macular degeneration    Thyroid  condition    Visual changes     Past Surgical History:  Procedure Laterality Date   CATARACT EXTRACTION, BILATERAL     CHOLECYSTECTOMY     COLONOSCOPY      ESOPHAGOGASTRODUODENOSCOPY ENDOSCOPY     TOTAL KNEE ARTHROPLASTY Left 06/26/2020   Procedure: TOTAL KNEE ARTHROPLASTY;  Surgeon: Claiborne Crew, MD;  Location: [facility];  Service: Orthopedics;  Laterality: Left;  70 mins    No current facility-administered medications for this encounter.   Current Outpatient Medications  Medication Sig Dispense Refill Last Dose/Taking   acetaminophen  (TYLENOL ) 500 MG tablet Take 500 mg by mouth 2 (two) times daily as needed for moderate pain (pain score 4-6).      aspirin  EC 81 MG tablet Take 81 mg by mouth daily. Swallow whole.      Calcium -Vitamin D 600-200 MG-UNIT per tablet Take 1 tablet by mouth daily.      cetirizine (ZYRTEC) 10 MG tablet Take 10 mg by mouth daily as needed for allergies.      Docusate Calcium  (STOOL SOFTENER PO) Take 1 tablet by mouth daily.      ezetimibe (ZETIA) 10 MG tablet Take 10 mg by mouth daily.      furosemide  (LASIX ) 40 MG tablet Take 40 mg by mouth daily.      glucosamine-chondroitin 500-400 MG tablet Take 1 tablet by mouth daily.      ibuprofen (ADVIL) 200 MG tablet Take 400 mg by mouth 2 (two) times daily as needed for moderate pain (pain score 4-6).      levothyroxine  (SYNTHROID ) 100 MCG tablet Take 100 mcg by mouth daily before breakfast.      memantine  (NAMENDA ) 5 MG tablet Take 5  mg by mouth 2 (two) times daily.      mirtazapine (REMERON) 15 MG tablet Take 1 tablet by mouth at bedtime.      Multiple Vitamin (MULTIVITAMIN WITH MINERALS) TABS tablet Take 1 tablet by mouth daily.      Multiple Vitamins-Minerals (ICAPS AREDS 2 PO) Take 1 capsule by mouth 2 (two) times daily.      omeprazole  (PRILOSEC) 40 MG capsule Take 40 mg by mouth daily.       potassium chloride  (KLOR-CON  M) 10 MEQ tablet Take 1 tablet by mouth daily.      No Known Allergies  Social History   Tobacco Use   Smoking status: Never   Smokeless tobacco: Never  Substance Use Topics   Alcohol use: No    No family history on file.   Review of Systems   Constitutional:  Negative for chills and fever.  Respiratory:  Negative for cough and shortness of breath.   Cardiovascular:  Negative for chest pain.  Gastrointestinal:  Negative for nausea and vomiting.  Musculoskeletal:  Positive for arthralgias.     Objective:  Physical Exam Well nourished and well developed. Ambulates with rollator.  General: Alert and oriented x3, cooperative and pleasant, no acute distress.  Musculoskeletal: Left Hip: Reproducible pain with all attempts at passive hip ROM Very limited rotation as well as flexion of the hip   Vital signs in last 24 hours:    Labs:   Estimated body mass index is 31.79 kg/m as calculated from the following:   Height as of 01/19/24: 5' 1.5 (1.562 m).   Weight as of 01/19/24: 77.6 kg.   Imaging Review Plain radiographs demonstrate severe degenerative joint disease of the left hip(s). The bone quality appears to be adequate for age and reported activity level.      Assessment/Plan:  End stage arthritis, left hip(s)  The patient history, physical examination, clinical judgement of the provider and imaging studies are consistent with end stage degenerative joint disease of the left hip(s) and total hip arthroplasty is deemed medically necessary. The treatment options including medical management, injection therapy, arthroscopy and arthroplasty were discussed at length. The risks and benefits of total hip arthroplasty were presented and reviewed. The risks due to aseptic loosening, infection, stiffness, dislocation/subluxation,  thromboembolic complications and other imponderables were discussed.  The patient acknowledged the explanation, agreed to proceed with the plan and consent was signed. Patient is being admitted for inpatient treatment for surgery, pain control, PT, OT, prophylactic antibiotics, VTE prophylaxis, progressive ambulation and ADL's and discharge planning.The patient is planning to be discharged  home.   Kim Pen, PA-C Orthopedic Surgery [facility] [place] [phone]

## 2024-01-25 ENCOUNTER — Encounter (HOSPITAL_COMMUNITY): Payer: Self-pay

## 2024-01-26 ENCOUNTER — Other Ambulatory Visit: Payer: Self-pay

## 2024-01-26 ENCOUNTER — Observation Stay (HOSPITAL_COMMUNITY)
Admission: RE | Admit: 2024-01-26 | Discharge: 2024-01-29 | Disposition: A | Discharge status: Home / Self Care | Source: Ambulatory Visit | Attending: Orthopedic Surgery | Admitting: Orthopedic Surgery

## 2024-01-26 ENCOUNTER — Ambulatory Visit (HOSPITAL_COMMUNITY): Admitting: Certified Registered"

## 2024-01-26 ENCOUNTER — Encounter (HOSPITAL_COMMUNITY)
Admission: RE | Disposition: A | Discharge status: Home / Self Care | Payer: Self-pay | Source: Ambulatory Visit | Attending: Orthopedic Surgery

## 2024-01-26 ENCOUNTER — Encounter (HOSPITAL_COMMUNITY): Payer: Self-pay | Admitting: Orthopedic Surgery

## 2024-01-26 ENCOUNTER — Ambulatory Visit (HOSPITAL_COMMUNITY)

## 2024-01-26 ENCOUNTER — Observation Stay (HOSPITAL_COMMUNITY)

## 2024-01-26 ENCOUNTER — Ambulatory Visit (HOSPITAL_COMMUNITY): Payer: Self-pay | Admitting: Physician Assistant

## 2024-01-26 DIAGNOSIS — M1612 Unilateral primary osteoarthritis, left hip: Secondary | ICD-10-CM | POA: Diagnosis not present

## 2024-01-26 DIAGNOSIS — E039 Hypothyroidism, unspecified: Secondary | ICD-10-CM | POA: Insufficient documentation

## 2024-01-26 DIAGNOSIS — Z7982 Long term (current) use of aspirin: Secondary | ICD-10-CM | POA: Insufficient documentation

## 2024-01-26 DIAGNOSIS — Z96652 Presence of left artificial knee joint: Secondary | ICD-10-CM | POA: Diagnosis not present

## 2024-01-26 DIAGNOSIS — Z96642 Presence of left artificial hip joint: Secondary | ICD-10-CM | POA: Diagnosis not present

## 2024-01-26 DIAGNOSIS — Z471 Aftercare following joint replacement surgery: Secondary | ICD-10-CM | POA: Diagnosis not present

## 2024-01-26 DIAGNOSIS — I2510 Atherosclerotic heart disease of native coronary artery without angina pectoris: Secondary | ICD-10-CM | POA: Diagnosis not present

## 2024-01-26 DIAGNOSIS — Z79899 Other long term (current) drug therapy: Secondary | ICD-10-CM | POA: Diagnosis not present

## 2024-01-26 HISTORY — PX: TOTAL HIP ARTHROPLASTY: SHX124

## 2024-01-26 LAB — TYPE AND SCREEN
ABO/RH(D): A POS
Antibody Screen: NEGATIVE

## 2024-01-26 SURGERY — ARTHROPLASTY, HIP, TOTAL, ANTERIOR APPROACH
Anesthesia: Monitor Anesthesia Care | Site: Hip | Laterality: Left

## 2024-01-26 MED ORDER — CHLORHEXIDINE GLUCONATE 0.12 % MT SOLN: 15.0000 mL | Freq: Once | OROMUCOSAL | Status: DC

## 2024-01-26 MED ORDER — LACTATED RINGERS IV SOLN: INTRAVENOUS | Status: DC

## 2024-01-26 MED ORDER — METOCLOPRAMIDE HCL 5 MG/ML IJ SOLN: 5.0000 mg | Freq: Three times a day (TID) | INTRAMUSCULAR | Status: DC | PRN

## 2024-01-26 MED ORDER — POTASSIUM CHLORIDE CRYS ER 10 MEQ PO TBCR
10.0000 meq | EXTENDED_RELEASE_TABLET | Freq: Every day | ORAL | Status: DC
  Administered 2024-01-26 – 2024-01-29 (×4): 10 meq via ORAL
  Filled 2024-01-26 (×4): qty 1

## 2024-01-26 MED ORDER — POLYETHYLENE GLYCOL 3350 17 G PO PACK
17.0000 g | PACK | Freq: Two times a day (BID) | ORAL | Status: DC
  Administered 2024-01-26 – 2024-01-28 (×4): 17 g via ORAL
  Filled 2024-01-26 (×5): qty 1

## 2024-01-26 MED ORDER — LEVOTHYROXINE SODIUM 100 MCG PO TABS
100.0000 ug | ORAL_TABLET | Freq: Every day | ORAL | Status: DC
  Administered 2024-01-27 – 2024-01-29 (×3): 100 ug via ORAL
  Filled 2024-01-26 (×3): qty 1

## 2024-01-26 MED ORDER — ASPIRIN 81 MG PO CHEW
81.0000 mg | CHEWABLE_TABLET | Freq: Two times a day (BID) | ORAL | Status: DC
  Administered 2024-01-26 – 2024-01-29 (×6): 81 mg via ORAL
  Filled 2024-01-26 (×6): qty 1

## 2024-01-26 MED ORDER — 0.9 % SODIUM CHLORIDE (POUR BTL) OPTIME
TOPICAL | Status: DC | PRN
  Administered 2024-01-26: 1000 mL

## 2024-01-26 MED ORDER — ACETAMINOPHEN 500 MG PO TABS
1000.0000 mg | ORAL_TABLET | Freq: Four times a day (QID) | ORAL | Status: DC
Start: 2024-01-26 — End: 2024-01-29
  Administered 2024-01-26 – 2024-01-29 (×10): 1000 mg via ORAL
  Filled 2024-01-26 (×10): qty 2

## 2024-01-26 MED ORDER — CEFAZOLIN SODIUM-DEXTROSE 2-4 GM/100ML-% IV SOLN
2.0000 g | INTRAVENOUS | Status: AC
  Administered 2024-01-26: 2 g via INTRAVENOUS
  Filled 2024-01-26: qty 100

## 2024-01-26 MED ORDER — FENTANYL CITRATE PF 50 MCG/ML IJ SOSY: 25.0000 ug | PREFILLED_SYRINGE | INTRAMUSCULAR | Status: DC | PRN

## 2024-01-26 MED ORDER — CHLORHEXIDINE GLUCONATE 4 % EX SOLN: 1.0000 | CUTANEOUS | 1 refills | Status: DC

## 2024-01-26 MED ORDER — POVIDONE-IODINE 10 % EX SWAB: 2.0000 | Freq: Once | CUTANEOUS | Status: DC

## 2024-01-26 MED ORDER — SENNA 8.6 MG PO TABS
2.0000 | ORAL_TABLET | Freq: Every day | ORAL | Status: DC
  Administered 2024-01-26 – 2024-01-28 (×3): 17.2 mg via ORAL
  Filled 2024-01-26 (×3): qty 2

## 2024-01-26 MED ORDER — METOCLOPRAMIDE HCL 5 MG PO TABS: 5.0000 mg | ORAL_TABLET | Freq: Three times a day (TID) | ORAL | Status: DC | PRN

## 2024-01-26 MED ORDER — BUPIVACAINE IN DEXTROSE 0.75-8.25 % IT SOLN
INTRATHECAL | Status: DC | PRN
  Administered 2024-01-26: 1.4 mL via INTRATHECAL

## 2024-01-26 MED ORDER — DIPHENHYDRAMINE HCL 12.5 MG/5ML PO ELIX: 12.5000 mg | ORAL_SOLUTION | ORAL | Status: DC | PRN

## 2024-01-26 MED ORDER — BISACODYL 10 MG RE SUPP: 10.0000 mg | Freq: Every day | RECTAL | Status: DC | PRN

## 2024-01-26 MED ORDER — OXYCODONE HCL 5 MG/5ML PO SOLN: 5.0000 mg | Freq: Once | ORAL | Status: DC | PRN

## 2024-01-26 MED ORDER — SODIUM CHLORIDE 0.9 % IV SOLN: INTRAVENOUS | Status: DC

## 2024-01-26 MED ORDER — PROPOFOL 1000 MG/100ML IV EMUL
INTRAVENOUS | Status: AC
  Filled 2024-01-26: qty 100

## 2024-01-26 MED ORDER — DEXAMETHASONE SODIUM PHOSPHATE 10 MG/ML IJ SOLN
INTRAMUSCULAR | Status: AC
  Filled 2024-01-26: qty 1

## 2024-01-26 MED ORDER — PANTOPRAZOLE SODIUM 40 MG PO TBEC
40.0000 mg | DELAYED_RELEASE_TABLET | Freq: Every day | ORAL | Status: DC
  Administered 2024-01-27 – 2024-01-29 (×3): 40 mg via ORAL
  Filled 2024-01-26 (×3): qty 1

## 2024-01-26 MED ORDER — STERILE WATER FOR IRRIGATION IR SOLN
Status: DC | PRN
  Administered 2024-01-26: 2000 mL

## 2024-01-26 MED ORDER — DEXAMETHASONE SODIUM PHOSPHATE 10 MG/ML IJ SOLN
10.0000 mg | Freq: Once | INTRAMUSCULAR | Status: AC
  Administered 2024-01-27: 10 mg via INTRAVENOUS
  Filled 2024-01-26: qty 1

## 2024-01-26 MED ORDER — ORAL CARE MOUTH RINSE: 15.0000 mL | Freq: Once | OROMUCOSAL | Status: DC

## 2024-01-26 MED ORDER — MEMANTINE HCL 10 MG PO TABS
5.0000 mg | ORAL_TABLET | Freq: Two times a day (BID) | ORAL | Status: DC
  Administered 2024-01-26 – 2024-01-29 (×6): 5 mg via ORAL
  Filled 2024-01-26 (×6): qty 1

## 2024-01-26 MED ORDER — HYDROMORPHONE HCL 1 MG/ML IJ SOLN
0.5000 mg | INTRAMUSCULAR | Status: DC | PRN
  Administered 2024-01-26: 0.5 mg via INTRAVENOUS
  Filled 2024-01-26: qty 1

## 2024-01-26 MED ORDER — PHENYLEPHRINE HCL-NACL 20-0.9 MG/250ML-% IV SOLN
INTRAVENOUS | Status: DC | PRN
  Administered 2024-01-26: 25 ug/min via INTRAVENOUS

## 2024-01-26 MED ORDER — MENTHOL 3 MG MT LOZG: 1.0000 | LOZENGE | OROMUCOSAL | Status: DC | PRN

## 2024-01-26 MED ORDER — CELECOXIB 200 MG PO CAPS
200.0000 mg | ORAL_CAPSULE | Freq: Two times a day (BID) | ORAL | Status: DC
  Administered 2024-01-26 – 2024-01-28 (×3): 200 mg via ORAL
  Filled 2024-01-26 (×4): qty 1

## 2024-01-26 MED ORDER — PHENOL 1.4 % MT LIQD: 1.0000 | OROMUCOSAL | Status: DC | PRN

## 2024-01-26 MED ORDER — KETOROLAC TROMETHAMINE 30 MG/ML IJ SOLN
INTRAMUSCULAR | Status: AC
  Filled 2024-01-26: qty 1

## 2024-01-26 MED ORDER — PROPOFOL 10 MG/ML IV BOLUS
INTRAVENOUS | Status: DC | PRN
  Administered 2024-01-26: 20 mg via INTRAVENOUS

## 2024-01-26 MED ORDER — ONDANSETRON HCL 4 MG PO TABS: 4.0000 mg | ORAL_TABLET | Freq: Four times a day (QID) | ORAL | Status: DC | PRN

## 2024-01-26 MED ORDER — TRANEXAMIC ACID-NACL 1000-0.7 MG/100ML-% IV SOLN
1000.0000 mg | Freq: Once | INTRAVENOUS | Status: AC
  Administered 2024-01-26: 1000 mg via INTRAVENOUS
  Filled 2024-01-26: qty 100

## 2024-01-26 MED ORDER — FENTANYL CITRATE (PF) 100 MCG/2ML IJ SOLN
INTRAMUSCULAR | Status: DC | PRN
  Administered 2024-01-26 (×2): 50 ug via INTRAVENOUS

## 2024-01-26 MED ORDER — TRANEXAMIC ACID-NACL 1000-0.7 MG/100ML-% IV SOLN
1000.0000 mg | INTRAVENOUS | Status: AC
  Administered 2024-01-26: 1000 mg via INTRAVENOUS
  Filled 2024-01-26: qty 100

## 2024-01-26 MED ORDER — DOCUSATE SODIUM 100 MG PO CAPS
100.0000 mg | ORAL_CAPSULE | Freq: Two times a day (BID) | ORAL | Status: DC
  Administered 2024-01-26 – 2024-01-29 (×6): 100 mg via ORAL
  Filled 2024-01-26 (×6): qty 1

## 2024-01-26 MED ORDER — HYDRALAZINE HCL 20 MG/ML IJ SOLN
INTRAMUSCULAR | Status: AC
  Filled 2024-01-26: qty 1

## 2024-01-26 MED ORDER — BUPIVACAINE-EPINEPHRINE (PF) 0.25% -1:200000 IJ SOLN
INTRAMUSCULAR | Status: AC
Start: 2024-01-26 — End: 2024-01-26
  Filled 2024-01-26: qty 30

## 2024-01-26 MED ORDER — TRAMADOL HCL 50 MG PO TABS
50.0000 mg | ORAL_TABLET | Freq: Four times a day (QID) | ORAL | Status: DC | PRN
  Administered 2024-01-27 – 2024-01-29 (×2): 50 mg via ORAL
  Filled 2024-01-26 (×2): qty 1

## 2024-01-26 MED ORDER — METHOCARBAMOL 1000 MG/10ML IJ SOLN: 500.0000 mg | Freq: Four times a day (QID) | INTRAMUSCULAR | Status: DC | PRN

## 2024-01-26 MED ORDER — MIRTAZAPINE 15 MG PO TABS
15.0000 mg | ORAL_TABLET | Freq: Every day | ORAL | Status: DC
  Administered 2024-01-26 – 2024-01-28 (×3): 15 mg via ORAL
  Filled 2024-01-26 (×3): qty 1

## 2024-01-26 MED ORDER — SODIUM CHLORIDE (PF) 0.9 % IJ SOLN
INTRAMUSCULAR | Status: AC
  Filled 2024-01-26: qty 50

## 2024-01-26 MED ORDER — CEFAZOLIN SODIUM-DEXTROSE 2-4 GM/100ML-% IV SOLN
2.0000 g | Freq: Four times a day (QID) | INTRAVENOUS | Status: AC
  Administered 2024-01-26 (×2): 2 g via INTRAVENOUS
  Filled 2024-01-26 (×2): qty 100

## 2024-01-26 MED ORDER — DEXAMETHASONE SODIUM PHOSPHATE 10 MG/ML IJ SOLN
8.0000 mg | Freq: Once | INTRAMUSCULAR | Status: AC
  Administered 2024-01-26: 8 mg via INTRAVENOUS

## 2024-01-26 MED ORDER — ALUM & MAG HYDROXIDE-SIMETH 200-200-20 MG/5ML PO SUSP
30.0000 mL | ORAL | Status: DC | PRN
Start: 2024-01-26 — End: 2024-01-29
  Administered 2024-01-28 (×2): 30 mL via ORAL
  Filled 2024-01-26 (×2): qty 30

## 2024-01-26 MED ORDER — EZETIMIBE 10 MG PO TABS
10.0000 mg | ORAL_TABLET | Freq: Every day | ORAL | Status: DC
  Administered 2024-01-26 – 2024-01-29 (×4): 10 mg via ORAL
  Filled 2024-01-26 (×4): qty 1

## 2024-01-26 MED ORDER — PROPOFOL 500 MG/50ML IV EMUL
INTRAVENOUS | Status: DC | PRN
  Administered 2024-01-26: 100 ug/kg/min via INTRAVENOUS

## 2024-01-26 MED ORDER — ONDANSETRON HCL 4 MG/2ML IJ SOLN
4.0000 mg | Freq: Four times a day (QID) | INTRAMUSCULAR | Status: DC | PRN
  Filled 2024-01-26: qty 2

## 2024-01-26 MED ORDER — FENTANYL CITRATE (PF) 100 MCG/2ML IJ SOLN
INTRAMUSCULAR | Status: AC
  Filled 2024-01-26: qty 2

## 2024-01-26 MED ORDER — MUPIROCIN 2 % EX OINT
1.0000 | TOPICAL_OINTMENT | Freq: Two times a day (BID) | CUTANEOUS | 0 refills | Status: AC
Start: 2024-01-26 — End: 2024-02-25

## 2024-01-26 MED ORDER — ONDANSETRON HCL 4 MG/2ML IJ SOLN
INTRAMUSCULAR | Status: DC | PRN
  Administered 2024-01-26: 4 mg via INTRAVENOUS

## 2024-01-26 MED ORDER — EPHEDRINE SULFATE-NACL 50-0.9 MG/10ML-% IV SOSY
PREFILLED_SYRINGE | INTRAVENOUS | Status: DC | PRN
  Administered 2024-01-26: 5 mg via INTRAVENOUS

## 2024-01-26 MED ORDER — FUROSEMIDE 40 MG PO TABS
40.0000 mg | ORAL_TABLET | Freq: Every day | ORAL | Status: DC
  Administered 2024-01-27 – 2024-01-29 (×3): 40 mg via ORAL
  Filled 2024-01-26 (×3): qty 1

## 2024-01-26 MED ORDER — METHOCARBAMOL 500 MG PO TABS
500.0000 mg | ORAL_TABLET | Freq: Four times a day (QID) | ORAL | Status: DC | PRN
Start: 2024-01-26 — End: 2024-01-29
  Administered 2024-01-26 – 2024-01-27 (×2): 500 mg via ORAL
  Filled 2024-01-26 (×2): qty 1

## 2024-01-26 MED ORDER — SODIUM CHLORIDE (PF) 0.9 % IJ SOLN
INTRAMUSCULAR | Status: DC | PRN
  Administered 2024-01-26: 61 mL

## 2024-01-26 MED ORDER — ONDANSETRON HCL 4 MG/2ML IJ SOLN
INTRAMUSCULAR | Status: AC
Start: 2024-01-26 — End: 2024-01-26
  Filled 2024-01-26: qty 2

## 2024-01-26 MED ORDER — HYDRALAZINE HCL 20 MG/ML IJ SOLN
10.0000 mg | Freq: Once | INTRAMUSCULAR | Status: AC
  Administered 2024-01-26: 10 mg via INTRAVENOUS

## 2024-01-26 MED ORDER — OXYCODONE HCL 5 MG PO TABS: 5.0000 mg | ORAL_TABLET | Freq: Once | ORAL | Status: DC | PRN

## 2024-01-26 MED ORDER — ONDANSETRON HCL 4 MG/2ML IJ SOLN: 4.0000 mg | Freq: Four times a day (QID) | INTRAMUSCULAR | Status: DC | PRN

## 2024-01-26 SURGICAL SUPPLY — 38 items
BAG COUNTER SPONGE SURGICOUNT (BAG) IMPLANT
BAG ZIPLOCK 12X15 (MISCELLANEOUS) ×2 IMPLANT
BLADE SAG 18X100X1.27 (BLADE) ×2 IMPLANT
COVER PERINEAL POST (MISCELLANEOUS) ×2 IMPLANT
COVER SURGICAL LIGHT HANDLE (MISCELLANEOUS) ×2 IMPLANT
CUP ACETBLR 52 OD PINNACLE (Hips) IMPLANT
DERMABOND ADVANCED .7 DNX12 (GAUZE/BANDAGES/DRESSINGS) ×2 IMPLANT
DRAPE FOOT SWITCH (DRAPES) ×2 IMPLANT
DRAPE STERI IOBAN 125X83 (DRAPES) ×2 IMPLANT
DRAPE U-SHAPE 47X51 STRL (DRAPES) ×4 IMPLANT
DRESSING AQUACEL AG SP 3.5X10 (GAUZE/BANDAGES/DRESSINGS) ×2 IMPLANT
DURAPREP 26ML APPLICATOR (WOUND CARE) ×2 IMPLANT
ELECT REM PT RETURN 15FT ADLT (MISCELLANEOUS) ×2 IMPLANT
FEMORAL STEM 12/14 TPR SZ4 HIP (Orthopedic Implant) IMPLANT
GLOVE BIO SURGEON STRL SZ 6 (GLOVE) ×2 IMPLANT
GLOVE BIOGEL PI IND STRL 6.5 (GLOVE) ×2 IMPLANT
GLOVE BIOGEL PI IND STRL 7.5 (GLOVE) ×2 IMPLANT
GLOVE ORTHO TXT STRL SZ7.5 (GLOVE) ×4 IMPLANT
GOWN STRL REUS W/ TWL LRG LVL3 (GOWN DISPOSABLE) ×4 IMPLANT
HEAD M SROM 36MM PLUS 1.5 (Hips) IMPLANT
HOLDER FOLEY CATH W/STRAP (MISCELLANEOUS) ×2 IMPLANT
KIT TURNOVER KIT A (KITS) ×2 IMPLANT
LINER NEUTRAL 52X36MM PLUS 4 (Liner) IMPLANT
MANIFOLD NEPTUNE II (INSTRUMENTS) ×2 IMPLANT
NDL SAFETY ECLIPSE 18X1.5 (NEEDLE) ×2 IMPLANT
PACK ANTERIOR HIP CUSTOM (KITS) ×2 IMPLANT
PENCIL SMOKE EVACUATOR (MISCELLANEOUS) ×2 IMPLANT
SCREW 6.5MMX25MM (Screw) IMPLANT
SUT MNCRL AB 4-0 PS2 18 (SUTURE) ×2 IMPLANT
SUT VIC AB 1 CT1 36 (SUTURE) ×6 IMPLANT
SUT VIC AB 2-0 CT1 TAPERPNT 27 (SUTURE) ×4 IMPLANT
SUTURE STRATFX 0 PDS 27 VIOLET (SUTURE) ×2 IMPLANT
SYR 3ML LL SCALE MARK (SYRINGE) ×2 IMPLANT
TOWEL GREEN STERILE FF (TOWEL DISPOSABLE) ×2 IMPLANT
TRAY FOLEY MTR SLVR 14FR STAT (SET/KITS/TRAYS/PACK) IMPLANT
TRAY FOLEY MTR SLVR 16FR STAT (SET/KITS/TRAYS/PACK) ×2 IMPLANT
TUBE SUCTION HIGH CAP CLEAR NV (SUCTIONS) ×2 IMPLANT
WATER STERILE IRR 1000ML POUR (IV SOLUTION) ×2 IMPLANT

## 2024-01-26 NOTE — Transfer of Care (Signed)
 Immediate Anesthesia Transfer of Care Note  Patient: Donna Petty  Procedure(s) Performed: ARTHROPLASTY, HIP, TOTAL, ANTERIOR APPROACH (Left: Hip)  Patient Location: PACU  Anesthesia Type:Spinal  Level of Consciousness: awake, alert , and oriented  Airway & Oxygen Therapy: Patient Spontanous Breathing and Patient connected to face mask oxygen  Post-op Assessment: Report given to RN and Post -op Vital signs reviewed and stable  Post vital signs: Reviewed and stable  Last Vitals:  Vitals Value Taken Time  BP 134/90 01/26/24 12:52  Temp    Pulse 67 01/26/24 12:56  Resp 22 01/26/24 12:56  SpO2 100 % 01/26/24 12:56  Vitals shown include unfiled device data.  Last Pain:  Vitals:   01/26/24 0954  TempSrc:   PainSc: 0-No pain         Complications: No notable events documented.

## 2024-01-26 NOTE — Evaluation (Signed)
 Physical Therapy Evaluation Patient Details Name: Donna Petty MRN: [id number] DOB: 06/17/1936 Today's Date: 01/26/2024  History of Present Illness  88 yo female presents to therapy s/p L THA, anterior approach on 01/26/2024 due to failure of conservative measures. Pt PMH includes but is not limited to: CAD, depression, arthritis, hypothyroidism, macular degeneration, and L TKA (2021).  Clinical Impression   Donna Petty is a 88 y.o. female POD 0 s/p L THA. Patient reports mod I with mobility at baseline. Patient is now limited by functional impairments (see PT problem list below) and requires min A x 2 for bed mobility and min A x  for transfers. Patient was unable to ambulate due to 10/10 pain with mobility at time of eval. Patient instructed in exercise to facilitate ROM and circulation to manage edema.Patient will benefit from continued skilled PT interventions to address impairments and progress towards PLOF. Acute PT will follow to progress mobility and stair training in preparation for safe discharge home with family support and [facility] services.       If plan is discharge home, recommend the following: A little help with bathing/dressing/bathroom;Assistance with cooking/housework;Assist for transportation;Help with stairs or ramp for entrance;A lot of help with walking and/or transfers   Can travel by private vehicle        Equipment Recommendations None recommended by PT  Recommendations for Other Services       Functional Status Assessment Patient has had a recent decline in their functional status and demonstrates the ability to make significant improvements in function in a reasonable and predictable amount of time.     Precautions / Restrictions Precautions Precautions: Fall Restrictions Weight Bearing Restrictions Per Provider Order: No      Mobility  Bed Mobility Overal bed mobility: Needs Assistance Bed Mobility: Supine to Sit     Supine to sit: Min assist, +2 for  physical assistance, +2 for safety/equipment, HOB elevated, Used rails     General bed mobility comments: min A for LE and trunk and cues for technique and scooting to EOB pt indicated 10/10 L LE once seated EOB    Transfers Overall transfer level: Needs assistance Equipment used: Rolling walker (2 wheels) Transfers: Sit to/from Stand, Bed to chair/wheelchair/BSC Sit to Stand: Min assist, From elevated surface, +2 physical assistance, +2 safety/equipment Stand pivot transfers: Min assist, +2 physical assistance, +2 safety/equipment, From elevated surface         General transfer comment: cues, pull to stand, pt maintained flexed posture thoughout transfer task and self limited L LE WB    Ambulation/Gait               General Gait Details: NT due to pt reported pain  Stairs            Wheelchair Mobility     Tilt Bed    Modified Rankin (Stroke Patients Only)       Balance Overall balance assessment: Needs assistance Sitting-balance support: Feet supported Sitting balance-Leahy Scale: Fair     Standing balance support: Bilateral upper extremity supported, During functional activity, Reliant on assistive device for balance Standing balance-Leahy Scale: Poor                               Pertinent Vitals/Pain Pain Assessment Pain Assessment: 0-10 (no pain at rest) Pain Score: 10-Worst pain ever Pain Location: L hip and LE Pain Descriptors / Indicators: Aching, Discomfort, Grimacing, Operative site guarding,  Sore Pain Intervention(s): Limited activity within patient's tolerance, Monitored during session, Repositioned, Premedicated before session, Patient requesting pain meds-RN notified, Ice applied    Home Living Family/patient expects to be discharged to:: Private residence Living Arrangements: Children Available Help at Discharge: Family Type of Home: House Home Access: Stairs to enter Entrance Stairs-Rails: Right;Left;Can reach  both Entrance Stairs-Number of Steps: 6   Home Layout: One level Home Equipment: Agricultural consultant (2 wheels);Rollator (4 wheels);Cane - single point      Prior Function Prior Level of Function : Independent/Modified Independent             Mobility Comments: mod I with SPC or rollator ADLs Comments: pt required occational assist or S for showering tasks no seat     Extremity/Trunk Assessment        Lower Extremity Assessment Lower Extremity Assessment: LLE deficits/detail LLE Deficits / Details: ankle DF 5/5, PF 4/5 LLE Sensation: WNL    Cervical / Trunk Assessment Cervical / Trunk Assessment: Normal  Communication   Communication Communication: No apparent difficulties    Cognition Arousal: Alert Behavior During Therapy: WFL for tasks assessed/performed   PT - Cognitive impairments: No apparent impairments                         Following commands: Intact       Cueing       General Comments      Exercises Total Joint Exercises Ankle Circles/Pumps: AROM, Both, 10 reps   Assessment/Plan    PT Assessment Patient needs continued PT services  PT Problem List Decreased strength;Decreased range of motion;Decreased activity tolerance;Decreased balance;Decreased mobility;Decreased coordination;Pain       PT Treatment Interventions DME instruction;Gait training;Stair training;Functional mobility training;Therapeutic activities;Therapeutic exercise;Balance training;Neuromuscular re-education;Patient/family education;Modalities    PT Goals (Current goals can be found in the Care Plan section)  Acute Rehab PT Goals Patient Stated Goal: to be able to climb the steps for the beach house PT Goal Formulation: With patient Time For Goal Achievement: 02/09/24 Potential to Achieve Goals: Good    Frequency 7X/week     Co-evaluation               AM-PAC PT 6 Clicks Mobility  Outcome Measure Help needed turning from your back to your side  while in a flat bed without using bedrails?: A Little Help needed moving from lying on your back to sitting on the side of a flat bed without using bedrails?: A Little Help needed moving to and from a bed to a chair (including a wheelchair)?: A Little Help needed standing up from a chair using your arms (e.g., wheelchair or bedside chair)?: A Little Help needed to walk in hospital room?: Total Help needed climbing 3-5 steps with a railing? : Total 6 Click Score: 14    End of Session Equipment Utilized During Treatment: Gait belt Activity Tolerance: Patient limited by pain;Patient limited by fatigue Patient left: in chair;with call bell/phone within reach;with chair alarm set;with family/visitor present Nurse Communication: Mobility status;Patient requests pain meds PT Visit Diagnosis: Unsteadiness on feet (R26.81);Other abnormalities of gait and mobility (R26.89);Muscle weakness (generalized) (M62.81);Difficulty in walking, not elsewhere classified (R26.2);Pain Pain - Right/Left: Left Pain - part of body: Leg;Hip    Time: 8266-8251 PT Time Calculation (min) (ACUTE ONLY): 15 min   Charges:   PT Evaluation $PT Eval Low Complexity: 1 Low   PT General Charges $$ ACUTE PT VISIT: 1 Visit  [place], PT Acute Rehab   [place] VEAR Drone 01/26/2024, 6:36 PM

## 2024-01-26 NOTE — Discharge Instructions (Signed)

## 2024-01-26 NOTE — Anesthesia Procedure Notes (Signed)
 Spinal  Patient location during procedure: OR End time: 01/26/2024 11:23 AM Reason for block: surgical anesthesia Staffing Performed: resident/CRNA  Resident/CRNA: Dwana Winton BIRCH, CRNA Performed by: Dominique Calvey, Clinical cytogeneticist D, CRNA Authorized by: Maryclare Cornet, MD   Preanesthetic Checklist Completed: patient identified, IV checked, site marked, risks and benefits discussed, surgical consent, monitors and equipment checked, pre-op evaluation and timeout performed Spinal Block Patient position: sitting Prep: DuraPrep Patient monitoring: heart rate, continuous pulse ox and blood pressure Approach: midline Location: L3-4 Injection technique: single-shot Needle Needle type: Pencan  Needle gauge: 24 G Needle length: 9 cm Assessment Sensory level: T6 Events: CSF return

## 2024-01-26 NOTE — Anesthesia Preprocedure Evaluation (Signed)
 Anesthesia Evaluation  Patient identified by MRN, date of birth, ID band Patient awake    Reviewed: Allergy & Precautions, H&P , NPO status , Patient's Chart, lab work & pertinent test results  Airway Mallampati: II   Neck ROM: full    Dental   Pulmonary shortness of breath   breath sounds clear to auscultation       Cardiovascular negative cardio ROS  Rhythm:regular Rate:Normal     Neuro/Psych  PSYCHIATRIC DISORDERS  Depression       GI/Hepatic ,GERD  ,,  Endo/Other  Hypothyroidism    Renal/GU      Musculoskeletal  (+) Arthritis ,    Abdominal   Peds  Hematology   Anesthesia Other Findings   Reproductive/Obstetrics                             Anesthesia Physical Anesthesia Plan  ASA: 2  Anesthesia Plan: MAC and Spinal   Post-op Pain Management:    Induction: Intravenous  PONV Risk Score and Plan: 2 and Propofol  infusion and Treatment may vary due to age or medical condition  Airway Management Planned: Simple Face Mask  Additional Equipment:   Intra-op Plan:   Post-operative Plan:   Informed Consent: I have reviewed the patients History and Physical, chart, labs and discussed the procedure including the risks, benefits and alternatives for the proposed anesthesia with the patient or authorized representative who has indicated his/her understanding and acceptance.     Dental advisory given  Plan Discussed with: CRNA, Anesthesiologist and Surgeon  Anesthesia Plan Comments:        Anesthesia Quick Evaluation

## 2024-01-26 NOTE — Interval H&P Note (Signed)
 History and Physical Interval Note:  01/26/2024 9:56 AM  Donna Petty  has presented today for surgery, with the diagnosis of Left hip osteoarthritis.  The various methods of treatment have been discussed with the patient and family. After consideration of risks, benefits and other options for treatment, the patient has consented to  Procedure(s): ARTHROPLASTY, HIP, TOTAL, ANTERIOR APPROACH (Left) as a surgical intervention.  The patient's history has been reviewed, patient examined, no change in status, stable for surgery.  I have reviewed the patient's chart and labs.  Questions were answered to the patient's satisfaction.     [name]

## 2024-01-26 NOTE — Care Plan (Signed)
 Ortho Bundle Case Management Note  Patient Details  Name: Donna Petty MRN: [id number] Date of Birth: [birth year]  LT THA on 01/26/24  DCP: Home with daughter DME: No needs, has RW PT: [facility]; order faxed                   DME Arranged:  N/A DME Agency:  NA  HH Arranged:    HH Agency:     Additional Comments: Please contact me with any questions of if this plan should need to change.  Burnard Dross, Case Manager [facility] [phone]  Ext. [phone]   01/26/2024, 12:01 PM

## 2024-01-26 NOTE — Op Note (Signed)
 NAME:  Donna Petty                ACCOUNT NO.: [id number]      MEDICAL RECORD NO.: [id number]      FACILITY:  [facility]      PHYSICIAN:  [name]  DATE OF BIRTH:  08-Aug-1935     DATE OF PROCEDURE:  01/26/2024                                 OPERATIVE REPORT         PREOPERATIVE DIAGNOSIS: Left  hip osteoarthritis.      POSTOPERATIVE DIAGNOSIS:  Left hip osteoarthritis.      PROCEDURE:  Left total hip replacement through an anterior approach   utilizing DePuy THR system, component size 52 mm pinnacle cup, a size 36+4 neutral   Altrex liner, a size 4 Hi Actis stem with a 36+1.5 Articuleze metal head ball.      SURGEON:  Donnice JONETTA. Car, M.D.      ASSISTANT:  Rosina Calin, PA-C     ANESTHESIA:  Spinal.      SPECIMENS:  None.      COMPLICATIONS:  None.      BLOOD LOSS:  300 cc     DRAINS:  None.      INDICATION OF THE PROCEDURE:  Donna Petty is a 88 y.o. female who had   presented to office for evaluation of left hip pain.  Radiographs revealed   progressive degenerative changes with bone-on-bone   articulation of the  hip joint, including subchondral cystic changes and osteophytes.  The patient had painful limited range of   motion significantly affecting their overall quality of life and function.  The patient was failing to    respond to conservative measures including medications and/or injections and activity modification and at this point was ready   to proceed with more definitive measures.  Consent was obtained for   benefit of pain relief.  Specific risks of infection, DVT, component   failure, dislocation, neurovascular injury, and need for revision surgery were reviewed in the office.     PROCEDURE IN DETAIL:  The patient was brought to operative theater.   Once adequate anesthesia, preoperative antibiotics, 2 gm of Ancef , 1 gm of Tranexamic Acid , and 10 mg of Decadron  were administered, the patient was positioned supine on the [employer] table.  Once the patient was safely positioned with adequate padding of boney prominences we predraped out the hip, and used fluoroscopy to confirm orientation of the pelvis.      The left hip was then prepped and draped from proximal iliac crest to   mid thigh with a shower curtain technique.      Time-out was performed identifying the patient, planned procedure, and the appropriate extremity.     An incision was then made 2 cm lateral to the   anterior superior iliac spine extending over the orientation of the   tensor fascia lata muscle and sharp dissection was carried down to the   fascia of the muscle.      The fascia was then incised.  The muscle belly was identified and swept   laterally and retractor placed along the superior neck.  Following   cauterization of the circumflex vessels and removing some pericapsular   fat, a second cobra retractor was placed on the inferior neck.  A T-capsulotomy was made along the line of the   superior neck to the trochanteric fossa, then extended proximally and   distally.  Tag sutures were placed and the retractors were then placed   intracapsular.  We then identified the trochanteric fossa and   orientation of my neck cut and then made a neck osteotomy with the femur on traction.  The femoral   head was removed without difficulty or complication.  Traction was let   off and retractors were placed posterior and anterior around the   acetabulum.      The labrum and foveal tissue were debrided.  I began reaming with a 47 mm   reamer and reamed up to 51 mm reamer with good bony bed preparation and a 52 mm  cup was chosen.  The final 52 mm Pinnacle cup was then impacted under fluoroscopy to confirm the depth of penetration and orientation with respect to   Abduction and forward flexion.  A screw was placed into the ilium followed by the hole eliminator.  The final   36+4 neutral Altrex liner was impacted with good visualized rim fit.  The cup  was positioned anatomically within the acetabular portion of the pelvis.      At this point, the femur was rolled to 100 degrees.  Further capsule was   released off the inferior aspect of the femoral neck.  I then   released the superior capsule proximally.  With the leg in a neutral position the hook was placed laterally   along the femur under the vastus lateralis origin and elevated manually and then held in position using the hook attachment on the bed.  The leg was then extended and adducted with the leg rolled to 100   degrees of external rotation.  Retractors were placed along the medial calcar and posteriorly over the greater trochanter.  Once the proximal femur was fully   exposed, I used a box osteotome to set orientation.  I then began   broaching with the starting chili pepper broach and passed this by hand and then broached up to 4.  With the 4 broach in place I chose a high offset neck and did several trial reductions.  The offset was appropriate, leg lengths   appeared to be equal best matched with the +1.5 head ball trial confirmed radiographically.   Given these findings, I went ahead and dislocated the hip, repositioned all   retractors and positioned the right hip in the extended and abducted position.  The final 4 Hi Actis stem was   chosen and it was impacted down to the level of neck cut.  Based on this   and the trial reductions, a final 36+1.5 Articuleze metal head ball was chosen and   impacted onto a clean and dry trunnion, and the hip was reduced.  The   hip had been irrigated throughout the case again at this point.  I did   reapproximate the superior capsular leaflet to the anterior leaflet   using #1 Vicryl.  The fascia of the   tensor fascia lata muscle was then reapproximated using #1 Vicryl and #0 Stratafix sutures.  The   remaining wound was closed with 2-0 Vicryl and running 4-0 Monocryl.   The hip was cleaned, dried, and dressed sterilely using Dermabond and    Aquacel dressing.  The patient was then brought   to recovery room in stable condition tolerating the procedure well.    Rosina Calin,  PA-C was present for the entirety of the case involved from   preoperative positioning, perioperative retractor management, general   facilitation of the case, as well as primary wound closure as assistant.            Donnice CORDOBA Ernie, M.D.        01/26/2024 9:56 AM

## 2024-01-26 NOTE — Plan of Care (Signed)

## 2024-01-27 ENCOUNTER — Encounter (HOSPITAL_COMMUNITY): Payer: Self-pay | Admitting: Orthopedic Surgery

## 2024-01-27 DIAGNOSIS — M1612 Unilateral primary osteoarthritis, left hip: Secondary | ICD-10-CM | POA: Diagnosis not present

## 2024-01-27 LAB — CBC
HCT: 33.6 % — ABNORMAL LOW (ref 36.0–46.0)
Hemoglobin: 10.7 g/dL — ABNORMAL LOW (ref 12.0–15.0)
MCH: 31.7 pg (ref 26.0–34.0)
MCHC: 31.8 g/dL (ref 30.0–36.0)
MCV: 99.4 fL (ref 80.0–100.0)
Platelets: 233 10*3/uL (ref 150–400)
RBC: 3.38 MIL/uL — ABNORMAL LOW (ref 3.87–5.11)
RDW: 14.3 % (ref 11.5–15.5)
WBC: 13.9 10*3/uL — ABNORMAL HIGH (ref 4.0–10.5)
nRBC: 0 % (ref 0.0–0.2)

## 2024-01-27 LAB — BASIC METABOLIC PANEL WITH GFR
Anion gap: 8 (ref 5–15)
BUN: 15 mg/dL (ref 8–23)
CO2: 22 mmol/L (ref 22–32)
Calcium: 8.3 mg/dL — ABNORMAL LOW (ref 8.9–10.3)
Chloride: 106 mmol/L (ref 98–111)
Creatinine, Ser: 0.98 mg/dL (ref 0.44–1.00)
GFR, Estimated: 57 mL/min — ABNORMAL LOW (ref >60)
Glucose, Bld: 142 mg/dL — ABNORMAL HIGH (ref 70–99)
Potassium: 4.2 mmol/L (ref 3.5–5.1)
Sodium: 136 mmol/L (ref 135–145)

## 2024-01-27 NOTE — Progress Notes (Signed)
 Physical Therapy Treatment Patient Details Name: Donna Petty MRN: [id number] DOB: 1936/02/07 Today's Date: 01/27/2024   History of Present Illness 88 yo female presents to therapy s/p L THA, anterior approach on 01/26/2024 due to failure of conservative measures. Pt PMH includes but is not limited to: CAD, depression, arthritis, hypothyroidism, macular degeneration, and L TKA (2021).    PT Comments  The patient  ambulated x 40', practiced 2 steps with 2 rails and noted to be quite fatigued. Patient has 6 STE . PT does not feel that patient is safe for DC, daughter present and expresses concern that patient is not ready for DC. Patient will benefit from nursing assisting  patient to ambulate to BR overnight and further PT visits in AM to ensure safe mobility and  daughter can provide appropriate level of care.   If plan is discharge home, recommend the following: Assistance with cooking/housework;Assist for transportation;Help with stairs or ramp for entrance;A little help with walking and/or transfers;A little help with bathing/dressing/bathroom   Can travel by private vehicle        Equipment Recommendations  None recommended by PT    Recommendations for Other Services       Precautions / Restrictions Precautions Precautions: Fall Recall of Precautions/Restrictions: Intact Restrictions Weight Bearing Restrictions Per Provider Order: No     Mobility  Bed Mobility   Bed Mobility: Sit to Supine     Supine to sit: Mod assist     General bed mobility comments: assist legs onto bed    Transfers Overall transfer level: Needs assistance Equipment used: Rolling walker (2 wheels) Transfers: Sit to/from Stand Sit to Stand: Min assist, Contact guard assist           General transfer comment: min A for initial power to stand, CGA for following reps from recliner x 4, more effort to stand from bed   Ambulation/Gait Ambulation/Gait assistance: Contact guard assist, Min  assist Gait Distance (Feet): 30 Feet Assistive device: Rolling walker (2 wheels) Gait Pattern/deviations: Step-to pattern, Step-through pattern, Decreased stride length, Trunk flexed Gait velocity: decreased     General Gait Details: CGA to min assist after practiced 2 steps, much fatigued   Stairs Stairs: Yes Stairs assistance: Min assist Stair Management: Two rails, Forwards Number of Stairs: 2 General stair comments: cues for proper sequence   Wheelchair Mobility     Tilt Bed    Modified Rankin (Stroke Patients Only)       Balance Overall balance assessment: Needs assistance Sitting-balance support: Feet supported Sitting balance-Leahy Scale: Good     Standing balance support: Bilateral upper extremity supported, During functional activity, Reliant on assistive device for balance Standing balance-Leahy Scale: Poor                              Communication Communication Communication: No apparent difficulties  Cognition Arousal: Alert Behavior During Therapy: WFL for tasks assessed/performed   PT - Cognitive impairments: No apparent impairments                         Following commands: Intact      Cueing    Exercises Total Joint Exercises Ankle Circles/Pumps: AROM, Both, 10 reps Short Arc Quad: AROM, 10 reps, Left, Supine Heel Slides: AAROM, 10 reps, Left, Supine Hip ABduction/ADduction: AAROM, 10 reps, Left, Supine    General Comments        Pertinent Vitals/Pain Pain  Assessment Pain Assessment: Faces Faces Pain Scale: Hurts a little bit Pain Location: left hip when leg lowered to foor Pain Descriptors / Indicators: Discomfort, Grimacing Pain Intervention(s): Monitored during session, Ice applied    Home Living                          Prior Function            PT Goals (current goals can now be found in the care plan section) Acute Rehab PT Goals Patient Stated Goal: to be able to climb the steps for  the beach house PT Goal Formulation: With patient Time For Goal Achievement: 02/09/24 Potential to Achieve Goals: Good Progress towards PT goals: Progressing toward goals    Frequency    7X/week      PT Plan      Co-evaluation              AM-PAC PT 6 Clicks Mobility   Outcome Measure  Help needed turning from your back to your side while in a flat bed without using bedrails?: A Little Help needed moving from lying on your back to sitting on the side of a flat bed without using bedrails?: A Little Help needed moving to and from a bed to a chair (including a wheelchair)?: A Lot Help needed standing up from a chair using your arms (e.g., wheelchair or bedside chair)?: A Little Help needed to walk in hospital room?: A Little Help needed climbing 3-5 steps with a railing? : A Lot 6 Click Score: 16    End of Session Equipment Utilized During Treatment: Gait belt Activity Tolerance: Patient tolerated treatment well Patient left: in bed;with call bell/phone within reach;with family/visitor present;with bed alarm set Nurse Communication: Mobility status PT Visit Diagnosis: Unsteadiness on feet (R26.81);Other abnormalities of gait and mobility (R26.89);Muscle weakness (generalized) (M62.81);Difficulty in walking, not elsewhere classified (R26.2);Pain Pain - Right/Left: Left Pain - part of body: Leg;Hip     Time: 8671-8585 PT Time Calculation (min) (ACUTE ONLY): 46 min  Charges:    $Gait Training: 8-22 mins $Therapeutic Exercise: 8-22 mins $Therapeutic Activity: 8-22 mins $Self Care/Home Management: 8-22 PT General Charges $$ ACUTE PT VISIT: 1 Visit                     Darice Potters PT Acute Rehabilitation Services Office [phone]    Potters Darice Norris 01/27/2024, 3:04 PM

## 2024-01-27 NOTE — Care Management Obs Status (Signed)
 MEDICARE OBSERVATION STATUS NOTIFICATION   Patient Details  Name: Donna Petty MRN: [id number] Date of Birth: Apr 11, 1936   Medicare Observation Status Notification Given:  Yes    NORMAN ASPEN, LCSW 01/27/2024, 11:02 AM

## 2024-01-27 NOTE — Plan of Care (Signed)
   Problem: Coping: Goal: Level of anxiety will decrease Outcome: Progressing   Problem: Pain Managment: Goal: General experience of comfort will improve and/or be controlled Outcome: Progressing   Problem: Safety: Goal: Ability to remain free from injury will improve Outcome: Progressing

## 2024-01-27 NOTE — Progress Notes (Signed)
   Subjective: 1 Day Post-Op Procedure(s) (LRB): ARTHROPLASTY, HIP, TOTAL, ANTERIOR APPROACH (Left) Patient reports pain as mild.   Patient seen in rounds for Dr. Ernie. Patient is resting in bed on exam this morning. No acute events overnight. Foley catheter removed. Patient was not able to ambulate with PT due to pain yesterday. We will start therapy today.   Objective: Vital signs in last 24 hours: Temp:  [97.5 F (36.4 C)-99.1 F (37.3 C)] 99.1 F (37.3 C) (06/25 0458) Pulse Rate:  [59-134] 75 (06/25 0458) Resp:  [14-25] 17 (06/25 0458) BP: (114-221)/(55-130) 114/64 (06/25 0458) SpO2:  [93 %-100 %] 93 % (06/25 0458) Weight:  [77.6 kg] 77.6 kg (06/24 0954)  Intake/Output from previous day:  Intake/Output Summary (Last 24 hours) at 01/27/2024 0738 Last data filed at 01/27/2024 0600 Gross per 24 hour  Intake 3438.92 ml  Output 2600 ml  Net 838.92 ml     Intake/Output this shift: No intake/output data recorded.  Labs: Recent Labs    01/27/24 0318  HGB 10.7*   Recent Labs    01/27/24 0318  WBC 13.9*  RBC 3.38*  HCT 33.6*  PLT 233   Recent Labs    01/27/24 0318  NA 136  K 4.2  CL 106  CO2 22  BUN 15  CREATININE 0.98  GLUCOSE 142*  CALCIUM  8.3*   No results for input(s): LABPT, INR in the last 72 hours.  Exam: General - Patient is Alert and Appropriate Extremity - Neurologically intact Sensation intact distally Intact pulses distally Dorsiflexion/Plantar flexion intact Dressing - dressing C/D/I Motor Function - intact, moving foot and toes well on exam.   Past Medical History:  Diagnosis Date   Acid reflux    Arthritis    Carotid artery disease (HCC)    50-70% left internal carotid artery stenosis   Depression    Dyspnea    Hypothyroidism    Macular degeneration    Thyroid  condition    Visual changes     Assessment/Plan: 1 Day Post-Op Procedure(s) (LRB): ARTHROPLASTY, HIP, TOTAL, ANTERIOR APPROACH (Left) Principal Problem:   S/P  total left hip arthroplasty  Estimated body mass index is 31.79 kg/m as calculated from the following:   Height as of this encounter: 5' 1.5 (1.562 m).   Weight as of this encounter: 77.6 kg. Advance diet Up with therapy  DVT Prophylaxis - Aspirin  Weight bearing as tolerated.  Hgb stable at 10.7 this AM Cr. 0.98  Up with PT today as able May have slow progression with some underlying memory impairment Has good family support with her daughter   Donna Petty, [facility] [phone] 01/27/2024, 7:38 AM

## 2024-01-27 NOTE — Progress Notes (Signed)
 Physical Therapy Treatment Patient Details Name: Donna Petty MRN: [id number] DOB: 28-Jun-1936 Today's Date: 01/27/2024   History of Present Illness 88 yo female presents to therapy s/p L THA, anterior approach on 01/26/2024 due to failure of conservative measures. Pt PMH includes but is not limited to: CAD, depression, arthritis, hypothyroidism, macular degeneration, and L TKA (2021).    PT Comments  Pt with significant improvement in pain, reports 0/10 throughout session with therapist and daughter asking during various tasks. Pt needing min A to slide LLE to EOB and scoot out to EOB. Pt powers to stand with min A for initial transfer but able to complete STS with CGA for subsequent reps. Pt completes step pivot to [facility] with CGA, voids bladder on BSC and ind with pericare. Pt amb 56 ft x2 with RW, maintains significantly forward flexed trunk that pt and daughter report are baseline, cued pt to improve heel-toe pattern, no near falls.  Daughter present throughout session reports pt had HHPT pre surgery in preparation and plan to continue post-op once home. Plan to return for 2nd session for stair training with hopes to d/c home with family support.   If plan is discharge home, recommend the following: Assistance with cooking/housework;Assist for transportation;Help with stairs or ramp for entrance;A little help with walking and/or transfers;A little help with bathing/dressing/bathroom   Can travel by private vehicle        Equipment Recommendations  None recommended by PT    Recommendations for Other Services       Precautions / Restrictions Precautions Precautions: Fall Recall of Precautions/Restrictions: Intact Restrictions Weight Bearing Restrictions Per Provider Order: No     Mobility  Bed Mobility Overal bed mobility: Needs Assistance Bed Mobility: Supine to Sit     Supine to sit: Min assist, HOB elevated, Used rails     General bed mobility comments: min A to mobilize LLE  and trunk up to sitting, slow movement    Transfers Overall transfer level: Needs assistance Equipment used: Rolling walker (2 wheels) Transfers: Sit to/from Stand, Bed to chair/wheelchair/BSC Sit to Stand: Min assist, Contact guard assist   Step pivot transfers: Contact guard assist       General transfer comment: min A for initial power to stand, CGA for following reps from EOB, BSC and recliner, CGA for step pivot to BSC and recliner, pt with significantly forward flexed trunk that daughter at bedside reports is baseline    Ambulation/Gait Ambulation/Gait assistance: Contact guard assist Gait Distance (Feet): 56 Feet (x2) Assistive device: Rolling walker (2 wheels) Gait Pattern/deviations: Step-to pattern, Step-through pattern, Decreased stride length, Trunk flexed Gait velocity: decreased     General Gait Details: step to progressing to step through, prefers to hit with L forefoot maintaining heel off of floor throughout gait pattern but able to correct to more heel-toe pattern with cues, trunk significantly forward flexed which pt and daughter report is baseline, no LOB or LE buckling noted   Stairs             Wheelchair Mobility     Tilt Bed    Modified Rankin (Stroke Patients Only)       Balance     Sitting balance-Leahy Scale: Good     Standing balance support: Bilateral upper extremity supported, During functional activity, Reliant on assistive device for balance Standing balance-Leahy Scale: Poor  Communication Communication Communication: No apparent difficulties  Cognition Arousal: Alert Behavior During Therapy: WFL for tasks assessed/performed   PT - Cognitive impairments: No apparent impairments                         Following commands: Intact      Cueing    Exercises Total Joint Exercises Ankle Circles/Pumps: AROM, 10 reps, Left, Seated Quad Sets: AROM, Left, 10 reps, Seated     General Comments        Pertinent Vitals/Pain Pain Assessment Pain Assessment: 0-10 Pain Score: 0-No pain    Home Living                          Prior Function            PT Goals (current goals can now be found in the care plan section) Acute Rehab PT Goals Patient Stated Goal: to be able to climb the steps for the beach house PT Goal Formulation: With patient Time For Goal Achievement: 02/09/24 Potential to Achieve Goals: Good Progress towards PT goals: Progressing toward goals    Frequency    7X/week      PT Plan      Co-evaluation              AM-PAC PT 6 Clicks Mobility   Outcome Measure  Help needed turning from your back to your side while in a flat bed without using bedrails?: A Little Help needed moving from lying on your back to sitting on the side of a flat bed without using bedrails?: A Little Help needed moving to and from a bed to a chair (including a wheelchair)?: A Little Help needed standing up from a chair using your arms (e.g., wheelchair or bedside chair)?: A Little Help needed to walk in hospital room?: A Little Help needed climbing 3-5 steps with a railing? : A Lot 6 Click Score: 17    End of Session Equipment Utilized During Treatment: Gait belt Activity Tolerance: Patient tolerated treatment well Patient left: in chair;with call bell/phone within reach;with chair alarm set;with family/visitor present Nurse Communication: Mobility status PT Visit Diagnosis: Unsteadiness on feet (R26.81);Other abnormalities of gait and mobility (R26.89);Muscle weakness (generalized) (M62.81);Difficulty in walking, not elsewhere classified (R26.2);Pain Pain - Right/Left: Left Pain - part of body: Leg;Hip     Time: 8887-8851 PT Time Calculation (min) (ACUTE ONLY): 36 min  Charges:    $Gait Training: 8-22 mins $Therapeutic Activity: 8-22 mins PT General Charges $$ ACUTE PT VISIT: 1 Visit                     Tori Evarose Altland PT,  DPT 01/27/24, 12:26 PM

## 2024-01-27 NOTE — TOC Transition Note (Signed)
 Transition of Care [facility]) - Discharge Note   Patient Details  Name: AQSA SENSABAUGH MRN: [id number] Date of Birth: Dec 11, 1935  Transition of [facility]) CM/SW Contact:  NORMAN ASPEN, LCSW Phone Number: 01/27/2024, 9:58 AM   Clinical Narrative:     Met with pt who confirms she has needed DME in the home.  HHPT already arranged with [facility].  No further TOC needs.  Final next level of care: Home w Home Health Services Barriers to Discharge: No Barriers Identified   Patient Goals and CMS Choice Patient states their goals for this hospitalization and ongoing recovery are:: return home          Discharge Placement                       Discharge Plan and Services Additional resources added to the After Visit Summary for                  DME Arranged: N/A DME Agency: NA       HH Arranged: PT HH Agency: [facility]        Social Drivers of Health (SDOH) Interventions SDOH Screenings   Food Insecurity: No Food Insecurity (01/26/2024)  Housing: Low Risk  (01/26/2024)  Transportation Needs: No Transportation Needs (01/26/2024)  Utilities: Not At Risk (01/26/2024)  Social Connections: Moderately Integrated (01/26/2024)  Tobacco Use: Low Risk  (01/26/2024)     Readmission Risk Interventions     No data to display

## 2024-01-28 ENCOUNTER — Encounter (HOSPITAL_COMMUNITY): Payer: Self-pay | Admitting: Orthopedic Surgery

## 2024-01-28 DIAGNOSIS — M1612 Unilateral primary osteoarthritis, left hip: Secondary | ICD-10-CM | POA: Diagnosis not present

## 2024-01-28 LAB — CBC
HCT: 31.5 % — ABNORMAL LOW (ref 36.0–46.0)
Hemoglobin: 10.2 g/dL — ABNORMAL LOW (ref 12.0–15.0)
MCH: 31.6 pg (ref 26.0–34.0)
MCHC: 32.4 g/dL (ref 30.0–36.0)
MCV: 97.5 fL (ref 80.0–100.0)
Platelets: 208 10*3/uL (ref 150–400)
RBC: 3.23 MIL/uL — ABNORMAL LOW (ref 3.87–5.11)
RDW: 14.3 % (ref 11.5–15.5)
WBC: 12.1 10*3/uL — ABNORMAL HIGH (ref 4.0–10.5)
nRBC: 0 % (ref 0.0–0.2)

## 2024-01-28 MED ORDER — HYDRALAZINE HCL 20 MG/ML IJ SOLN
10.0000 mg | Freq: Four times a day (QID) | INTRAMUSCULAR | Status: DC | PRN
  Administered 2024-01-28 – 2024-01-29 (×2): 10 mg via INTRAVENOUS
  Filled 2024-01-28 (×2): qty 1

## 2024-01-28 NOTE — Plan of Care (Signed)
   Problem: Coping: Goal: Level of anxiety will decrease Outcome: Progressing   Problem: Pain Managment: Goal: General experience of comfort will improve and/or be controlled Outcome: Progressing   Problem: Safety: Goal: Ability to remain free from injury will improve Outcome: Progressing

## 2024-01-28 NOTE — Progress Notes (Signed)
 Patient ID: Donna Petty, female   DOB: 10-12-1935, 88 y.o.   MRN: [id number] Subjective: 2 Days Post-Op Procedure(s) (LRB): ARTHROPLASTY, HIP, TOTAL, ANTERIOR APPROACH (Left)    Patient reports pain as moderate. Hypertensive without events Has made some slow progress with PT  Objective:   VITALS:   Vitals:   01/28/24 0133 01/28/24 0506  BP: (!) 165/97 (!) 178/90  Pulse: 83 (!) 57  Resp: 17 17  Temp: 98.2 F (36.8 C) 98.5 F (36.9 C)  SpO2: 94% 96%    Neurovascular intact Incision: dressing C/D/I  LABS Recent Labs    01/27/24 0318 01/28/24 0317  HGB 10.7* 10.2*  HCT 33.6* 31.5*  WBC 13.9* 12.1*  PLT 233 208    Recent Labs    01/27/24 0318  NA 136  K 4.2  BUN 15  CREATININE 0.98  GLUCOSE 142*    No results for input(s): LABPT, INR in the last 72 hours.   Assessment/Plan: 2 Days Post-Op Procedure(s) (LRB): ARTHROPLASTY, HIP, TOTAL, ANTERIOR APPROACH (Left)   Up with therapy Still planning for safe home discharge Work with therapy towards goal Will evaluate her blood pressure meds and make sure she is taking what she normally takes Likely will make sure for outpatient follow up with primary car physician to adjust meds if needed outside the acute perioperative setting

## 2024-01-28 NOTE — Progress Notes (Signed)
 Physical Therapy Treatment Patient Details Name: Donna Petty MRN: [id number] DOB: 07-03-1936 Today's Date: 01/28/2024   History of Present Illness 88 yo female presents to therapy s/p L THA, anterior approach on 01/26/2024 due to failure of conservative measures. Pt PMH includes but is not limited to: CAD, depression, arthritis, hypothyroidism, macular degeneration, and L TKA (2021).    PT Comments  BP 156/81 pre, post 167/88 after hydralazine.  Patient able to progress distance in ambulation.   Will practice steps again in AM.    If plan is discharge home, recommend the following: Assistance with cooking/housework;Assist for transportation;Help with stairs or ramp for entrance;A little help with walking and/or transfers;A little help with bathing/dressing/bathroom   Can travel by private vehicle        Equipment Recommendations  None recommended by PT    Recommendations for Other Services       Precautions / Restrictions Precautions Precautions: Fall Recall of Precautions/Restrictions: Intact Precaution/Restrictions Comments: BP running high, not on meds, except IV Restrictions Weight Bearing Restrictions Per Provider Order: No LLE Weight Bearing Per Provider Order: Weight bearing as tolerated     Mobility  Bed Mobility Overal bed mobility: Needs Assistance Bed Mobility: Sit to Supine     Supine to sit: Min assist Sit to supine: Min assist   General bed mobility comments: assist with legs    Transfers Overall transfer level: Needs assistance Equipment used: Rolling walker (2 wheels) Transfers: Sit to/from Stand Sit to Stand: Min assist           General transfer comment: min A for initial power to stand from lower bed, CGA from higher chair    Ambulation/Gait Ambulation/Gait assistance: Contact guard assist, Min assist Gait Distance (Feet): 30 Feet (50' then 100') Assistive device: Rolling walker (2 wheels) Gait Pattern/deviations: Step-through pattern,  Decreased stride length, Trunk flexed Gait velocity: decreased     General Gait Details: tends to speed, cues to slow   Stairs SWheelchair Mobility     Tilt Bed    Modified Rankin (Stroke Patients Only)       Balance Overall balance assessment: Needs assistance Sitting-balance support: Feet supported Sitting balance-Leahy Scale: Good     Standing balance support: Bilateral upper extremity supported, During functional activity, Reliant on assistive device for balance Standing balance-Leahy Scale: Poor                              Communication Communication Communication: No apparent difficulties  Cognition Arousal: Alert Behavior During Therapy: WFL for tasks assessed/performed   PT - Cognitive impairments: No apparent impairments                         Following commands: Intact      Cueing    Exercises   General Comments        Pertinent Vitals/Pain Pain Assessment Faces Pain Scale: Hurts a little bit Pain Location: during step training Pain Descriptors / Indicators: Sore Pain Intervention(s): Monitored during session, Premedicated before session    Home Living                          Prior Function            PT Goals (current goals can now be found in the care plan section) Progress towards PT goals: Progressing toward goals    Frequency  7X/week      PT Plan      Co-evaluation              AM-PAC PT 6 Clicks Mobility   Outcome Measure  Help needed turning from your back to your side while in a flat bed without using bedrails?: A Little Help needed moving from lying on your back to sitting on the side of a flat bed without using bedrails?: A Little Help needed moving to and from a bed to a chair (including a wheelchair)?: A Little Help needed standing up from a chair using your arms (e.g., wheelchair or bedside chair)?: A Little Help needed to walk in hospital room?: A Little Help needed  climbing 3-5 steps with a railing? : A Lot 6 Click Score: 17    End of Session Equipment Utilized During Treatment: Gait belt Activity Tolerance: Patient tolerated treatment well Patient left: in bed;with call bell/phone within reach;with bed alarm set Nurse Communication: Mobility status PT Visit Diagnosis: Unsteadiness on feet (R26.81);Other abnormalities of gait and mobility (R26.89);Muscle weakness (generalized) (M62.81);Difficulty in walking, not elsewhere classified (R26.2);Pain Pain - Right/Left: Left Pain - part of body: Leg;Hip     Time: 8496-8464 PT Time Calculation (min) (ACUTE ONLY): 32 min  Charges:    $Gait Training: 23-37 mins $Therapeutic Exercise: 8-22 mins PT General Charges $$ ACUTE PT VISIT: 1 Visit                     Donna Petty PT Acute Rehabilitation Services Office [phone]   Donna Petty 01/28/2024, 4:26 PM

## 2024-01-28 NOTE — Anesthesia Postprocedure Evaluation (Signed)
 Anesthesia Post Note  Patient: Donna Petty  Procedure(s) Performed: ARTHROPLASTY, HIP, TOTAL, ANTERIOR APPROACH (Left: Hip)     Patient location during evaluation: PACU Anesthesia Type: MAC and Spinal Level of consciousness: oriented and awake and alert Pain management: pain level controlled Vital Signs Assessment: post-procedure vital signs reviewed and stable Respiratory status: spontaneous breathing, respiratory function stable and patient connected to nasal cannula oxygen Cardiovascular status: blood pressure returned to baseline and stable Postop Assessment: no headache, no backache and no apparent nausea or vomiting Anesthetic complications: no   No notable events documented.  Last Vitals:  Vitals:   01/28/24 0133 01/28/24 0506  BP: (!) 165/97 (!) 178/90  Pulse: 83 (!) 57  Resp: 17 17  Temp: 36.8 C 36.9 C  SpO2: 94% 96%    Last Pain:  Vitals:   01/28/24 0506  TempSrc: Oral  PainSc: 2                  Muntaha Vermette S

## 2024-01-28 NOTE — Progress Notes (Signed)
 Physical Therapy Treatment Patient Details Name: Donna Petty MRN: [id number] DOB: [birth year] Today's Date: 01/28/2024   History of Present Illness 88 yo female presents to therapy s/p L THA, anterior approach on 01/26/2024 due to failure of conservative measures. Pt PMH includes but is not limited to: CAD, depression, arthritis, hypothyroidism, macular degeneration, and L TKA (2021).    PT Comments  Patient reports feeling stiff, daughter at bedside. Patient requires min assistance for bed mobility assisting the LLE, min to stand from bed, extra effort. Patient ambulated to steps with RW x ~ 20'.Patient steeped the short step side using 2 rails with CGA. Patient had much difficulty going down the taller step side, required mod steady assist  from PT, chair brought up for pt  to sit down as pt. Was fatigued.Pt. rested then  practiced up the higher steps and down the shorter steps with less effort but still fatigued, recliner placed at base of steps so pt. could  turn and sit down. BP 184/78, RN notified.  Patient has not met goals for safe DC due to struggling with steps and pt. Has 6 STE, currently will be a hardship. Also concern for high BP. Continue PT for  safe negotiation of multiple steps.   If plan is discharge home, recommend the following: Assistance with cooking/housework;Assist for transportation;Help with stairs or ramp for entrance;A little help with walking and/or transfers;A little help with bathing/dressing/bathroom   Can travel by private vehicle        Equipment Recommendations  None recommended by PT    Recommendations for Other Services       Precautions / Restrictions Precautions Precautions: Fall Recall of Precautions/Restrictions: Intact Precaution/Restrictions Comments: BP running high, not on meds Restrictions Weight Bearing Restrictions Per Provider Order: No     Mobility  Bed Mobility Overal bed mobility: Needs Assistance Bed Mobility: Supine to Sit      Supine to sit: Min assist     General bed mobility comments: assist with legs    Transfers Overall transfer level: Needs assistance Equipment used: Rolling walker (2 wheels) Transfers: Sit to/from Stand Sit to Stand: Min assist           General transfer comment: min A for initial power to stand from lower bed, CGA from higher chair    Ambulation/Gait Ambulation/Gait assistance: Contact guard assist, Min assist Gait Distance (Feet): 30 Feet Assistive device: Rolling walker (2 wheels) Gait Pattern/deviations: Step-to pattern, Step-through pattern, Decreased stride length, Trunk flexed Gait velocity: decreased     General Gait Details: patient ambulated to steps, went over, then chair needed to brought up, pt. quite fatigued.   Stairs Stairs: Yes Stairs assistance: Mod assist, +2 safety/equipment Stair Management: Two rails, Forwards Number of Stairs: 2 General stair comments: patient went up th short steps and had much difficulty going down the taller side, required steady assist  from PT, chair brought up for pt  to sit down. rested and practiced up the higher steps and down the shorter steps with less effort but still fatigued, recliner placed at base of steps so pt. could  turn and sit down   Wheelchair Mobility     Tilt Bed    Modified Rankin (Stroke Patients Only)       Balance Overall balance assessment: Needs assistance Sitting-balance support: Feet supported Sitting balance-Leahy Scale: Good     Standing balance support: Bilateral upper extremity supported, During functional activity, Reliant on assistive device for balance Standing balance-Leahy Scale: Poor  Communication Communication Communication: No apparent difficulties  Cognition Arousal: Alert Behavior During Therapy: WFL for tasks assessed/performed   PT - Cognitive impairments: No apparent impairments                          Following commands: Intact      Cueing    Exercises Total Joint Exercises Ankle Circles/Pumps: AROM, Both, 10 reps Heel Slides: AAROM, 10 reps, Left, Supine Hip ABduction/ADduction: AAROM, 10 reps, Left, Supine    General Comments        Pertinent Vitals/Pain Pain Assessment Faces Pain Scale: Hurts little more Pain Location: during step training Pain Descriptors / Indicators: Discomfort, Grimacing Pain Intervention(s): Monitored during session, Premedicated before session    Home Living                          Prior Function            PT Goals (current goals can now be found in the care plan section) Progress towards PT goals: Progressing toward goals    Frequency    7X/week      PT Plan      Co-evaluation              AM-PAC PT 6 Clicks Mobility   Outcome Measure  Help needed turning from your back to your side while in a flat bed without using bedrails?: A Little Help needed moving from lying on your back to sitting on the side of a flat bed without using bedrails?: A Little Help needed moving to and from a bed to a chair (including a wheelchair)?: A Little Help needed standing up from a chair using your arms (e.g., wheelchair or bedside chair)?: A Little Help needed to walk in hospital room?: A Little Help needed climbing 3-5 steps with a railing? : A Lot 6 Click Score: 17    End of Session Equipment Utilized During Treatment: Gait belt Activity Tolerance: Patient limited by fatigue Patient left: in chair;with call bell/phone within reach;with family/visitor present Nurse Communication: Mobility status PT Visit Diagnosis: Unsteadiness on feet (R26.81);Other abnormalities of gait and mobility (R26.89);Muscle weakness (generalized) (M62.81);Difficulty in walking, not elsewhere classified (R26.2);Pain Pain - Right/Left: Left Pain - part of body: Leg;Hip     Time: 8957-8873 PT Time Calculation (min) (ACUTE ONLY): 44  min  Charges:    $Gait Training: 23-37 mins $Therapeutic Exercise: 8-22 mins PT General Charges $$ ACUTE PT VISIT: 1 Visit                     Darice Potters PT Acute Rehabilitation Services Office [phone]    Potters Darice Norris 01/28/2024, 12:54 PM

## 2024-01-29 DIAGNOSIS — M1612 Unilateral primary osteoarthritis, left hip: Secondary | ICD-10-CM | POA: Diagnosis not present

## 2024-01-29 MED ORDER — ASPIRIN 81 MG PO CHEW: 81.0000 mg | CHEWABLE_TABLET | Freq: Two times a day (BID) | ORAL | 0 refills | Status: AC

## 2024-01-29 MED ORDER — TRAMADOL HCL 50 MG PO TABS: 50.0000 mg | ORAL_TABLET | Freq: Four times a day (QID) | ORAL | 0 refills | Status: DC | PRN

## 2024-01-29 MED ORDER — METHOCARBAMOL 500 MG PO TABS: 500.0000 mg | ORAL_TABLET | Freq: Three times a day (TID) | ORAL | 1 refills | Status: DC | PRN

## 2024-01-29 MED ORDER — POLYETHYLENE GLYCOL 3350 17 G PO PACK: 17.0000 g | PACK | Freq: Two times a day (BID) | ORAL | 0 refills | Status: DC

## 2024-01-29 NOTE — Progress Notes (Signed)
 Physical Therapy Treatment Patient Details Name: Donna Petty MRN: [id number] DOB: Oct 27, 1935 Today's Date: 01/29/2024   History of Present Illness 88 yo female presents to therapy s/p L THA, anterior approach on 01/26/2024 due to failure of conservative measures. Pt PMH includes but is not limited to: CAD, depression, arthritis, hypothyroidism, macular degeneration, and L TKA (2021).    PT Comments  Session focus on stair training, completed in 2 bouts. Pt ascends 2 normal height steps and descends 3 short steps with bil handrail, then 3 normal height steps and descends 4 short steps with bil handrail, CGA +2 for safety, able to maintain good sequencing, no LE bucking, LOB or unsteadiness noted. Pt does require seated rest break between training sessions, daughter reports pt nervous and focused on correct sequencing. Pt and daughter report confidence with stair training, decline additional reps due to wanting to reserve energy for home entry. Daughter reports +2 will be present at home as well for stair negotiation. All education completed, pt and daughter report ready to d/c home with plan to start HHPT tomorrow.   If plan is discharge home, recommend the following: Assistance with cooking/housework;Assist for transportation;Help with stairs or ramp for entrance;A little help with walking and/or transfers;A little help with bathing/dressing/bathroom   Can travel by private vehicle        Equipment Recommendations  None recommended by PT    Recommendations for Other Services       Precautions / Restrictions Precautions Precautions: Fall Recall of Precautions/Restrictions: Intact Restrictions Weight Bearing Restrictions Per Provider Order: No LLE Weight Bearing Per Provider Order: Weight bearing as tolerated     Mobility  Bed Mobility Overal bed mobility: Needs Assistance Bed Mobility: Supine to Sit     Supine to sit: Min assist, Used rails     General bed mobility comments: in  recliner upon arrival    Transfers Overall transfer level: Needs assistance Equipment used: Rolling walker (2 wheels) Transfers: Sit to/from Stand Sit to Stand: Supervision           General transfer comment: supv for STS reps from recliner, maintains forward flexed trunk in standing    Ambulation/Gait   Stairs Stairs: Yes Stairs assistance: Contact guard assist, +2 safety/equipment Stair Management: Step to pattern, Two rails, Forwards Number of Stairs: 5 General stair comments: 2 stair training bouts with pt ascending 2 normal height steps with bil handrail and descending 3 short steps with bil handrail, then 3 normal steps and 4 short steps with bil handrail, CGA +2 for safety, seated rest break between 2 stair training bouts, daughter present throughout   Wheelchair Mobility     Tilt Bed    Modified Rankin (Stroke Patients Only)       Balance Overall balance assessment: Needs assistance Sitting-balance support: Feet supported Sitting balance-Leahy Scale: Good     Standing balance support: Bilateral upper extremity supported, During functional activity, Reliant on assistive device for balance Standing balance-Leahy Scale: Poor                              Communication Communication Communication: No apparent difficulties  Cognition Arousal: Alert Behavior During Therapy: WFL for tasks assessed/performed   PT - Cognitive impairments: No apparent impairments                         Following commands: Intact      Cueing  Exercises      General Comments General comments (skin integrity, edema, etc.): BP 150/74 in supine at beginning of treatment      Pertinent Vitals/Pain Pain Assessment Pain Assessment: No/denies pain Faces Pain Scale: Hurts a little bit Pain Location: L hip initially with bed mobility Pain Descriptors / Indicators: Sore Pain Intervention(s): Limited activity within patient's tolerance, Monitored  during session, Repositioned    Home Living                          Prior Function            PT Goals (current goals can now be found in the care plan section) Acute Rehab PT Goals Patient Stated Goal: to be able to climb the steps for the beach house PT Goal Formulation: With patient Time For Goal Achievement: 02/09/24 Potential to Achieve Goals: Good Progress towards PT goals: Progressing toward goals    Frequency    7X/week      PT Plan      Co-evaluation              AM-PAC PT 6 Clicks Mobility   Outcome Measure  Help needed turning from your back to your side while in a flat bed without using bedrails?: A Little Help needed moving from lying on your back to sitting on the side of a flat bed without using bedrails?: A Little Help needed moving to and from a bed to a chair (including a wheelchair)?: A Little Help needed standing up from a chair using your arms (e.g., wheelchair or bedside chair)?: A Little Help needed to walk in hospital room?: A Little Help needed climbing 3-5 steps with a railing? : A Little 6 Click Score: 18    End of Session Equipment Utilized During Treatment: Gait belt Activity Tolerance: Patient tolerated treatment well Patient left: in chair;with call bell/phone within reach;with chair alarm set;with family/visitor present Nurse Communication: Mobility status PT Visit Diagnosis: Unsteadiness on feet (R26.81);Other abnormalities of gait and mobility (R26.89);Muscle weakness (generalized) (M62.81);Difficulty in walking, not elsewhere classified (R26.2);Pain Pain - Right/Left: Left Pain - part of body: Leg;Hip     Time: 1420-1440 PT Time Calculation (min) (ACUTE ONLY): 20 min  Charges:    $Gait Training: 8-22 mins PT General Charges $$ ACUTE PT VISIT: 1 Visit                     Tori Ivanna Kocak PT, DPT 01/29/24, 3:05 PM

## 2024-01-29 NOTE — Progress Notes (Signed)
 Patient ID: Donna Petty, female   DOB: 01/17/1936, 88 y.o.   MRN: [id number] Subjective: 3 Days Post-Op Procedure(s) (LRB): ARTHROPLASTY, HIP, TOTAL, ANTERIOR APPROACH (Left)    Patient reports pain as mild to moderate  Objective:   VITALS:   Vitals:   01/29/24 0508 01/29/24 0555  BP: (!) 156/109 (!) 156/109  Pulse: (!) 54   Resp: 17   Temp: 98.5 F (36.9 C)   SpO2: 96%     Neurovascular intact Incision: dressing C/D/I  LABS Recent Labs    01/27/24 0318 01/28/24 0317  HGB 10.7* 10.2*  HCT 33.6* 31.5*  WBC 13.9* 12.1*  PLT 233 208    Recent Labs    01/27/24 0318  NA 136  K 4.2  BUN 15  CREATININE 0.98  GLUCOSE 142*    No results for input(s): LABPT, INR in the last 72 hours.   Assessment/Plan: 3 Days Post-Op Procedure(s) (LRB): ARTHROPLASTY, HIP, TOTAL, ANTERIOR APPROACH (Left)   Up with therapy, WBAT LLE Work with steps today Home today if she passes therapy She apparently has HHPT evaluation tomorrow RTC in 2 weeks Follow up with PCP at discharge for evaluation and management of HTN Discussed plans with her daughter this morining

## 2024-01-29 NOTE — Progress Notes (Signed)
 Physical Therapy Treatment Patient Details Name: Donna Petty MRN: [id number] DOB: April 19, 1936 Today's Date: 01/29/2024   History of Present Illness 88 yo female presents to therapy s/p L THA, anterior approach on 01/26/2024 due to failure of conservative measures. Pt PMH includes but is not limited to: CAD, depression, arthritis, hypothyroidism, macular degeneration, and L TKA (2021).    PT Comments  Pt reports discomfort with initial bed mobility but resolves with movement. Pt amb 30 ft with RW, taking quick short steps, trunk significantly forward flexed, verbal cues for maintaining body within RW frame; seated rest break between amb. Performed stair training this session with daughter present offering encouragement as needed. Therapist behind pt and daughter in front, CGA +2 for safety, pt using bil handrail, able to maintain correct sequencing. Pt significantly fatigued after completing 2 steps, unable to ambulate back around staircase to recliner and needing it brought to her for seated rest break. Pt has 6 steps to enter/exit the home; plan to continue progressing stair training as able. Pt and daughter educated on HEP performance while in hospital and both verbalize understanding.  Daughter reports HHPT arriving tomorrow, also plans to have 2nd person present to assist pt into the home at d/c; daughter is hopeful to d/c home today. Plan to return for 2nd session for stair training with hopes to d/c home with family support.   If plan is discharge home, recommend the following: Assistance with cooking/housework;Assist for transportation;Help with stairs or ramp for entrance;A little help with walking and/or transfers;A little help with bathing/dressing/bathroom   Can travel by private vehicle        Equipment Recommendations  None recommended by PT    Recommendations for Other Services       Precautions / Restrictions Precautions Precautions: Fall Recall of Precautions/Restrictions:  Intact Restrictions Weight Bearing Restrictions Per Provider Order: No LLE Weight Bearing Per Provider Order: Weight bearing as tolerated     Mobility  Bed Mobility Overal bed mobility: Needs Assistance Bed Mobility: Supine to Sit     Supine to sit: Min assist, Used rails     General bed mobility comments: slow to power up, min A to mobilize LLE to EOB, pt self assisting trunk with bedrail    Transfers Overall transfer level: Needs assistance Equipment used: Rolling walker (2 wheels) Transfers: Sit to/from Stand Sit to Stand: Contact guard assist           General transfer comment: CGA for transfers from EOB and recliner, verbal cues for hand placement, slow to power up    Ambulation/Gait Ambulation/Gait assistance: Contact guard assist Gait Distance (Feet): 30 Feet (x2) Assistive device: Rolling walker (2 wheels) Gait Pattern/deviations: Decreased stride length, Trunk flexed, Decreased step length - right, Decreased step length - left Gait velocity: decreased     General Gait Details: quick short steps, trunk forward flexed, verbal cues for flat L foot posture to decrease walking on L toe, cues to maintain body close to [employer] Stairs: Yes Stairs assistance: Contact guard assist, +2 safety/equipment Stair Management: Step to pattern, Two rails, Forwards Number of Stairs: 2 General stair comments: pt ascends normal height step with bil handrail, descends short steps wtih bil handrail, able to perform with correct sequencing, once completing pt fatigued needing seated rest break; daughter present for stair training   Wheelchair Mobility     Tilt Bed    Modified Rankin (Stroke Patients Only)       Balance Overall balance assessment: Needs  assistance Sitting-balance support: Feet supported Sitting balance-Leahy Scale: Good     Standing balance support: Bilateral upper extremity supported, During functional activity, Reliant on assistive device for  balance Standing balance-Leahy Scale: Poor                              Communication Communication Communication: No apparent difficulties  Cognition Arousal: Alert Behavior During Therapy: WFL for tasks assessed/performed   PT - Cognitive impairments: No apparent impairments                         Following commands: Intact      Cueing    Exercises      General Comments General comments (skin integrity, edema, etc.): BP 150/74 in supine at beginning of treatment      Pertinent Vitals/Pain Pain Assessment Pain Assessment: Faces Faces Pain Scale: Hurts a little bit Pain Location: L hip initially with bed mobility Pain Descriptors / Indicators: Sore Pain Intervention(s): Limited activity within patient's tolerance, Monitored during session, Repositioned    Home Living                          Prior Function            PT Goals (current goals can now be found in the care plan section) Acute Rehab PT Goals Patient Stated Goal: to be able to climb the steps for the beach house PT Goal Formulation: With patient Time For Goal Achievement: 02/09/24 Potential to Achieve Goals: Good Progress towards PT goals: Progressing toward goals    Frequency    7X/week      PT Plan      Co-evaluation              AM-PAC PT 6 Clicks Mobility   Outcome Measure  Help needed turning from your back to your side while in a flat bed without using bedrails?: A Little Help needed moving from lying on your back to sitting on the side of a flat bed without using bedrails?: A Little Help needed moving to and from a bed to a chair (including a wheelchair)?: A Little Help needed standing up from a chair using your arms (e.g., wheelchair or bedside chair)?: A Little Help needed to walk in hospital room?: A Little Help needed climbing 3-5 steps with a railing? : A Lot 6 Click Score: 17    End of Session Equipment Utilized During Treatment:  Gait belt Activity Tolerance: Patient tolerated treatment well Patient left: in chair;with call bell/phone within reach;with chair alarm set;with family/visitor present Nurse Communication: Mobility status PT Visit Diagnosis: Unsteadiness on feet (R26.81);Other abnormalities of gait and mobility (R26.89);Muscle weakness (generalized) (M62.81);Difficulty in walking, not elsewhere classified (R26.2);Pain Pain - Right/Left: Left Pain - part of body: Leg;Hip     Time: 1026-1058 PT Time Calculation (min) (ACUTE ONLY): 32 min  Charges:    $Gait Training: 23-37 mins PT General Charges $$ ACUTE PT VISIT: 1 Visit                     Tori Caliber Landess PT, DPT 01/29/24, 11:56 AM

## 2024-01-30 DIAGNOSIS — E669 Obesity, unspecified: Secondary | ICD-10-CM | POA: Diagnosis not present

## 2024-01-30 DIAGNOSIS — F0393 Unspecified dementia, unspecified severity, with mood disturbance: Secondary | ICD-10-CM | POA: Diagnosis not present

## 2024-01-30 DIAGNOSIS — I10 Essential (primary) hypertension: Secondary | ICD-10-CM | POA: Diagnosis not present

## 2024-01-30 DIAGNOSIS — K219 Gastro-esophageal reflux disease without esophagitis: Secondary | ICD-10-CM | POA: Diagnosis not present

## 2024-01-30 DIAGNOSIS — M1990 Unspecified osteoarthritis, unspecified site: Secondary | ICD-10-CM | POA: Diagnosis not present

## 2024-01-30 DIAGNOSIS — E785 Hyperlipidemia, unspecified: Secondary | ICD-10-CM | POA: Diagnosis not present

## 2024-01-30 DIAGNOSIS — Z471 Aftercare following joint replacement surgery: Secondary | ICD-10-CM | POA: Diagnosis not present

## 2024-01-30 DIAGNOSIS — H547 Unspecified visual loss: Secondary | ICD-10-CM | POA: Diagnosis not present

## 2024-01-30 DIAGNOSIS — F32A Depression, unspecified: Secondary | ICD-10-CM | POA: Diagnosis not present

## 2024-01-30 DIAGNOSIS — I2510 Atherosclerotic heart disease of native coronary artery without angina pectoris: Secondary | ICD-10-CM | POA: Diagnosis not present

## 2024-01-30 DIAGNOSIS — Z9841 Cataract extraction status, right eye: Secondary | ICD-10-CM | POA: Diagnosis not present

## 2024-01-30 DIAGNOSIS — Z7982 Long term (current) use of aspirin: Secondary | ICD-10-CM | POA: Diagnosis not present

## 2024-01-30 DIAGNOSIS — Z96642 Presence of left artificial hip joint: Secondary | ICD-10-CM | POA: Diagnosis not present

## 2024-01-30 DIAGNOSIS — E039 Hypothyroidism, unspecified: Secondary | ICD-10-CM | POA: Diagnosis not present

## 2024-01-30 DIAGNOSIS — I779 Disorder of arteries and arterioles, unspecified: Secondary | ICD-10-CM | POA: Diagnosis not present

## 2024-01-30 DIAGNOSIS — Z9842 Cataract extraction status, left eye: Secondary | ICD-10-CM | POA: Diagnosis not present

## 2024-01-30 DIAGNOSIS — Z96652 Presence of left artificial knee joint: Secondary | ICD-10-CM | POA: Diagnosis not present

## 2024-01-30 DIAGNOSIS — Z6832 Body mass index (BMI) 32.0-32.9, adult: Secondary | ICD-10-CM | POA: Diagnosis not present

## 2024-01-30 DIAGNOSIS — Z9181 History of falling: Secondary | ICD-10-CM | POA: Diagnosis not present

## 2024-01-30 DIAGNOSIS — H3530 Unspecified macular degeneration: Secondary | ICD-10-CM | POA: Diagnosis not present

## 2024-01-30 NOTE — Discharge Summary (Signed)
 Patient ID: DARIS ARISTIZABAL MRN: [id number] DOB/AGE: 1935-10-13 88 y.o.  Admit date: 01/26/2024 Discharge date: 01/29/2024  Admission Diagnoses:  Left hip osteoarthritis  Discharge Diagnoses:  Principal Problem:   S/P total left hip arthroplasty   Past Medical History:  Diagnosis Date   Acid reflux    Arthritis    Carotid artery disease (HCC)    50-70% left internal carotid artery stenosis   Depression    Dyspnea    Hypothyroidism    Macular degeneration    Thyroid  condition    Visual changes     Surgeries: Procedure(s): ARTHROPLASTY, HIP, TOTAL, ANTERIOR APPROACH on 01/26/2024   Consultants:   Discharged Condition: Improved  Hospital Course: AHNIYA MITCHUM is an 88 y.o. female who was admitted 01/26/2024 for operative treatment ofS/P total left hip arthroplasty. Patient has severe unremitting pain that affects sleep, daily activities, and work/hobbies. After pre-op clearance the patient was taken to the operating room on 01/26/2024 and underwent  Procedure(s): ARTHROPLASTY, HIP, TOTAL, ANTERIOR APPROACH.    Patient was given perioperative antibiotics:  Anti-infectives (From admission, onward)    Start     Dose/Rate Route Frequency Ordered Stop   01/26/24 1800  ceFAZolin  (ANCEF ) IVPB 2g/100 mL premix        2 g 200 mL/hr over 30 Minutes Intravenous Every 6 hours 01/26/24 1610 01/27/24 0010   01/26/24 0930  ceFAZolin  (ANCEF ) IVPB 2g/100 mL premix        2 g 200 mL/hr over 30 Minutes Intravenous On call to O.R. 01/26/24 9077 01/26/24 1125        Patient was given sequential compression devices, early ambulation, and chemoprophylaxis to prevent DVT. Patient worked with PT and was meeting their goals regarding safe ambulation and transfers. She made slow progress over several days but was able to discharge home with HHPT.  Patient benefited maximally from hospital stay and there were no complications.    Recent vital signs: No data found.   Recent laboratory studies:   Recent Labs    01/28/24 0317  WBC 12.1*  HGB 10.2*  HCT 31.5*  PLT 208     Discharge Medications:   Allergies as of 01/29/2024   No Known Allergies      Medication List     STOP taking these medications    aspirin  EC 81 MG tablet Replaced by: aspirin  81 MG chewable tablet       TAKE these medications    Advil 200 MG Caps Generic drug: Ibuprofen Take 200-400 mg by mouth 2 (two) times daily as needed (for pain).   aspirin  81 MG chewable tablet Chew 1 tablet (81 mg total) by mouth 2 (two) times daily for 28 days. Replaces: aspirin  EC 81 MG tablet   Calcium -Vitamin D 600-200 MG-UNIT tablet Take 1 tablet by mouth daily.   cetirizine 10 MG tablet Commonly known as: ZYRTEC Take 10 mg by mouth daily.   chlorhexidine  4 % external liquid Commonly known as: HIBICLENS  Apply 15 mLs (1 Application total) topically as directed for 30 doses. Use as directed daily for 5 days every other week for 6 weeks.   ezetimibe  10 MG tablet Commonly known as: ZETIA  Take 10 mg by mouth every other day.   furosemide  40 MG tablet Commonly known as: LASIX  Take 40 mg by mouth daily.   glucosamine-chondroitin 500-400 MG tablet Take 1 tablet by mouth daily.   ICAPS AREDS 2 PO Take 1 capsule by mouth 2 (two) times daily.   levothyroxine  100  MCG tablet Commonly known as: SYNTHROID  Take 100 mcg by mouth daily before breakfast.   memantine  5 MG tablet Commonly known as: NAMENDA  Take 5 mg by mouth 2 (two) times daily.   methocarbamol  500 MG tablet Commonly known as: ROBAXIN  Take 1 tablet (500 mg total) by mouth every 8 (eight) hours as needed for muscle spasms.   mirtazapine  15 MG tablet Commonly known as: REMERON  Take 15 mg by mouth at bedtime.   multivitamin with minerals Tabs tablet Take 1 tablet by mouth daily.   mupirocin  ointment 2 % Commonly known as: BACTROBAN  Place 1 Application into the nose 2 (two) times daily for 60 doses. Use as directed 2 times daily for 5  days every other week for 6 weeks.   omeprazole  40 MG capsule Commonly known as: PRILOSEC Take 40 mg by mouth daily before breakfast.   polyethylene glycol 17 g packet Commonly known as: MIRALAX  / GLYCOLAX  Take 17 g by mouth 2 (two) times daily.   potassium chloride  10 MEQ tablet Commonly known as: KLOR-CON  M Take 5 mEq by mouth every evening.   STOOL SOFTENER PO Take 1 capsule by mouth every other day.   traMADol  50 MG tablet Commonly known as: ULTRAM  Take 1 tablet (50 mg total) by mouth every 6 (six) hours as needed for severe pain (pain score 7-10).   TYLENOL  500 MG tablet Generic drug: acetaminophen  Take 500 mg by mouth 2 (two) times daily as needed (for pain).               Discharge Care Instructions  (From admission, onward)           Start     Ordered   01/29/24 0000  Change dressing       Comments: Maintain surgical dressing until follow up in the clinic. If the edges start to pull up, may reinforce with tape. If the dressing is no longer working, may remove and cover with gauze and tape, but must keep the area dry and clean.  Call with any questions or concerns.   01/29/24 0933            Diagnostic Studies: DG Pelvis Portable Result Date: 01/26/2024 CLINICAL DATA:  Status post hip arthroplasty. EXAM: PORTABLE PELVIS 1-2 VIEWS COMPARISON:  None Available. FINDINGS: Left hip arthroplasty in expected alignment. No periprosthetic lucency or fracture. Recent postsurgical change includes air and edema in the soft tissues. IMPRESSION: Left hip arthroplasty without immediate postoperative complication. Electronically Signed   By: Andrea Gasman M.D.   On: 01/26/2024 14:40   DG HIP UNILAT WITH PELVIS 1V LEFT Result Date: 01/26/2024 CLINICAL DATA:  Elective surgery. EXAM: DG HIP (WITH OR WITHOUT PELVIS) 1V*L* COMPARISON:  None Available. FINDINGS: Nine fluoroscopic spot views of the pelvis and left hip obtained in the operating room. Sequential images during  hip arthroplasty. Fluoroscopy time 14 seconds. Dose 1.79 mGy. IMPRESSION: Intraoperative fluoroscopy during left hip arthroplasty. Electronically Signed   By: Andrea Gasman M.D.   On: 01/26/2024 12:54   DG C-Arm 1-60 Min-No Report Result Date: 01/26/2024 Fluoroscopy was utilized by the requesting physician.  No radiographic interpretation.   DG C-Arm 1-60 Min-No Report Result Date: 01/26/2024 Fluoroscopy was utilized by the requesting physician.  No radiographic interpretation.    Disposition: Discharge disposition: 01-Home or Self Care       Discharge Instructions     Call MD / Call 911   Complete by: As directed    If you experience chest pain  or shortness of breath, CALL 911 and be transported to the hospital emergency room.  If you develope a fever above 101 F, pus (white drainage) or increased drainage or redness at the wound, or calf pain, call your surgeon's office.   Change dressing   Complete by: As directed    Maintain surgical dressing until follow up in the clinic. If the edges start to pull up, may reinforce with tape. If the dressing is no longer working, may remove and cover with gauze and tape, but must keep the area dry and clean.  Call with any questions or concerns.   Constipation Prevention   Complete by: As directed    Drink plenty of fluids.  Prune juice may be helpful.  You may use a stool softener, such as Colace (over the counter) 100 mg twice a day.  Use MiraLax  (over the counter) for constipation as needed.   Diet - low sodium heart healthy   Complete by: As directed    Increase activity slowly as tolerated   Complete by: As directed    Weight bearing as tolerated with assist device (walker, cane, etc) as directed, use it as long as suggested by your surgeon or therapist, typically at least 4-6 weeks.   Post-operative opioid taper instructions:   Complete by: As directed    POST-OPERATIVE OPIOID TAPER INSTRUCTIONS: It is important to wean off of your  opioid medication as soon as possible. If you do not need pain medication after your surgery it is ok to stop day one. Opioids include: Codeine, Hydrocodone (Norco, Vicodin), Oxycodone (Percocet, oxycontin ) and hydromorphone  amongst others.  Long term and even short term use of opiods can cause: Increased pain response Dependence Constipation Depression Respiratory depression And more.  Withdrawal symptoms can include Flu like symptoms Nausea, vomiting And more Techniques to manage these symptoms Hydrate well Eat regular healthy meals Stay active Use relaxation techniques(deep breathing, meditating, yoga) Do Not substitute Alcohol to help with tapering If you have been on opioids for less than two weeks and do not have pain than it is ok to stop all together.  Plan to wean off of opioids This plan should start within one week post op of your joint replacement. Maintain the same interval or time between taking each dose and first decrease the dose.  Cut the total daily intake of opioids by one tablet each day Next start to increase the time between doses. The last dose that should be eliminated is the evening dose.      TED hose   Complete by: As directed    Use stockings (TED hose) for 2 weeks on both leg(s).  You may remove them at night for sleeping.        Follow-up Information     Ernie Cough, MD. Schedule an appointment as soon as possible for a visit in 2 week(s).   Specialty: Orthopedic Surgery Contact information: [address] [place] [address] [place] [address] [phone]         [facility] Follow up.   Specialty: Home Health Services Why: to provide home physical therapy visits Contact information: [address] [place] [address] [phone]         Corlis Pagan, NP. Schedule an appointment as soon as possible for a visit in 1 week(s).   Why: Hypertension Contact information: [address] [place] [address] [place]  [place] [address] [phone]  Signed: Rosina JONELLE Calin 01/30/2024, 3:43 PM

## 2024-03-09 DIAGNOSIS — Z471 Aftercare following joint replacement surgery: Secondary | ICD-10-CM | POA: Diagnosis not present

## 2024-03-09 DIAGNOSIS — Z96642 Presence of left artificial hip joint: Secondary | ICD-10-CM | POA: Diagnosis not present

## 2024-04-12 DIAGNOSIS — E7800 Pure hypercholesterolemia, unspecified: Secondary | ICD-10-CM | POA: Diagnosis not present

## 2024-04-12 DIAGNOSIS — Z0000 Encounter for general adult medical examination without abnormal findings: Secondary | ICD-10-CM | POA: Diagnosis not present

## 2024-04-12 DIAGNOSIS — I10 Essential (primary) hypertension: Secondary | ICD-10-CM | POA: Diagnosis not present

## 2024-04-12 DIAGNOSIS — E039 Hypothyroidism, unspecified: Secondary | ICD-10-CM | POA: Diagnosis not present

## 2024-04-12 DIAGNOSIS — R339 Retention of urine, unspecified: Secondary | ICD-10-CM | POA: Diagnosis not present

## 2024-04-19 DIAGNOSIS — E782 Mixed hyperlipidemia: Secondary | ICD-10-CM | POA: Diagnosis not present

## 2024-04-19 DIAGNOSIS — Z9989 Dependence on other enabling machines and devices: Secondary | ICD-10-CM | POA: Diagnosis not present

## 2024-04-19 DIAGNOSIS — I6529 Occlusion and stenosis of unspecified carotid artery: Secondary | ICD-10-CM | POA: Diagnosis not present

## 2024-04-19 DIAGNOSIS — F322 Major depressive disorder, single episode, severe without psychotic features: Secondary | ICD-10-CM | POA: Diagnosis not present

## 2024-04-19 DIAGNOSIS — N3281 Overactive bladder: Secondary | ICD-10-CM | POA: Diagnosis not present

## 2024-04-19 DIAGNOSIS — Z0000 Encounter for general adult medical examination without abnormal findings: Secondary | ICD-10-CM | POA: Diagnosis not present

## 2024-04-19 DIAGNOSIS — E039 Hypothyroidism, unspecified: Secondary | ICD-10-CM | POA: Diagnosis not present

## 2024-04-19 DIAGNOSIS — Z789 Other specified health status: Secondary | ICD-10-CM | POA: Diagnosis not present

## 2024-04-19 DIAGNOSIS — I10 Essential (primary) hypertension: Secondary | ICD-10-CM | POA: Diagnosis not present

## 2024-04-19 DIAGNOSIS — Z8673 Personal history of transient ischemic attack (TIA), and cerebral infarction without residual deficits: Secondary | ICD-10-CM | POA: Diagnosis not present

## 2024-04-19 DIAGNOSIS — K219 Gastro-esophageal reflux disease without esophagitis: Secondary | ICD-10-CM | POA: Diagnosis not present

## 2024-04-19 DIAGNOSIS — R413 Other amnesia: Secondary | ICD-10-CM | POA: Diagnosis not present

## 2024-05-18 DIAGNOSIS — H353124 Nonexudative age-related macular degeneration, left eye, advanced atrophic with subfoveal involvement: Secondary | ICD-10-CM | POA: Diagnosis not present

## 2024-05-18 DIAGNOSIS — H353122 Nonexudative age-related macular degeneration, left eye, intermediate dry stage: Secondary | ICD-10-CM | POA: Diagnosis not present

## 2024-05-18 DIAGNOSIS — Z961 Presence of intraocular lens: Secondary | ICD-10-CM | POA: Diagnosis not present

## 2024-05-18 DIAGNOSIS — H35373 Puckering of macula, bilateral: Secondary | ICD-10-CM | POA: Diagnosis not present

## 2024-05-24 ENCOUNTER — Inpatient Hospital Stay (HOSPITAL_COMMUNITY)
Admission: EM | Admit: 2024-05-24 | Discharge: 2024-06-02 | DRG: 061 | Disposition: A | Discharge status: Rehabilitation Facility | Attending: Neurology | Admitting: Neurology

## 2024-05-24 ENCOUNTER — Emergency Department (HOSPITAL_COMMUNITY)

## 2024-05-24 ENCOUNTER — Inpatient Hospital Stay (HOSPITAL_COMMUNITY): Admitting: Anesthesiology

## 2024-05-24 ENCOUNTER — Encounter (HOSPITAL_COMMUNITY)
Admission: EM | Disposition: A | Discharge status: Rehabilitation Facility | Payer: Self-pay | Source: Home / Self Care | Attending: Neurology

## 2024-05-24 DIAGNOSIS — Z7409 Other reduced mobility: Secondary | ICD-10-CM | POA: Diagnosis present

## 2024-05-24 DIAGNOSIS — I639 Cerebral infarction, unspecified: Secondary | ICD-10-CM | POA: Diagnosis not present

## 2024-05-24 DIAGNOSIS — I771 Stricture of artery: Secondary | ICD-10-CM | POA: Diagnosis present

## 2024-05-24 DIAGNOSIS — E039 Hypothyroidism, unspecified: Secondary | ICD-10-CM | POA: Diagnosis present

## 2024-05-24 DIAGNOSIS — Z66 Do not resuscitate: Secondary | ICD-10-CM | POA: Diagnosis present

## 2024-05-24 DIAGNOSIS — R29707 NIHSS score 7: Secondary | ICD-10-CM | POA: Diagnosis present

## 2024-05-24 DIAGNOSIS — N179 Acute kidney failure, unspecified: Secondary | ICD-10-CM | POA: Diagnosis not present

## 2024-05-24 DIAGNOSIS — I7090 Unspecified atherosclerosis: Secondary | ICD-10-CM | POA: Diagnosis not present

## 2024-05-24 DIAGNOSIS — Z7901 Long term (current) use of anticoagulants: Secondary | ICD-10-CM

## 2024-05-24 DIAGNOSIS — I69354 Hemiplegia and hemiparesis following cerebral infarction affecting left non-dominant side: Secondary | ICD-10-CM | POA: Diagnosis not present

## 2024-05-24 DIAGNOSIS — I6522 Occlusion and stenosis of left carotid artery: Secondary | ICD-10-CM | POA: Diagnosis present

## 2024-05-24 DIAGNOSIS — Z8673 Personal history of transient ischemic attack (TIA), and cerebral infarction without residual deficits: Secondary | ICD-10-CM

## 2024-05-24 DIAGNOSIS — H518 Other specified disorders of binocular movement: Secondary | ICD-10-CM | POA: Diagnosis present

## 2024-05-24 DIAGNOSIS — I10 Essential (primary) hypertension: Secondary | ICD-10-CM

## 2024-05-24 DIAGNOSIS — H3530 Unspecified macular degeneration: Secondary | ICD-10-CM | POA: Diagnosis present

## 2024-05-24 DIAGNOSIS — G8194 Hemiplegia, unspecified affecting left nondominant side: Secondary | ICD-10-CM | POA: Diagnosis not present

## 2024-05-24 DIAGNOSIS — F4321 Adjustment disorder with depressed mood: Secondary | ICD-10-CM | POA: Diagnosis not present

## 2024-05-24 DIAGNOSIS — I4819 Other persistent atrial fibrillation: Secondary | ICD-10-CM | POA: Diagnosis not present

## 2024-05-24 DIAGNOSIS — K219 Gastro-esophageal reflux disease without esophagitis: Secondary | ICD-10-CM | POA: Diagnosis present

## 2024-05-24 DIAGNOSIS — R29702 NIHSS score 2: Secondary | ICD-10-CM | POA: Diagnosis not present

## 2024-05-24 DIAGNOSIS — Z515 Encounter for palliative care: Secondary | ICD-10-CM | POA: Diagnosis not present

## 2024-05-24 DIAGNOSIS — Z6831 Body mass index (BMI) 31.0-31.9, adult: Secondary | ICD-10-CM

## 2024-05-24 DIAGNOSIS — Z96642 Presence of left artificial hip joint: Secondary | ICD-10-CM | POA: Diagnosis not present

## 2024-05-24 DIAGNOSIS — G4700 Insomnia, unspecified: Secondary | ICD-10-CM | POA: Diagnosis not present

## 2024-05-24 DIAGNOSIS — E785 Hyperlipidemia, unspecified: Secondary | ICD-10-CM | POA: Diagnosis present

## 2024-05-24 DIAGNOSIS — I129 Hypertensive chronic kidney disease with stage 1 through stage 4 chronic kidney disease, or unspecified chronic kidney disease: Secondary | ICD-10-CM | POA: Diagnosis not present

## 2024-05-24 DIAGNOSIS — G936 Cerebral edema: Secondary | ICD-10-CM | POA: Diagnosis not present

## 2024-05-24 DIAGNOSIS — Z7189 Other specified counseling: Secondary | ICD-10-CM | POA: Diagnosis not present

## 2024-05-24 DIAGNOSIS — Z538 Procedure and treatment not carried out for other reasons: Secondary | ICD-10-CM | POA: Diagnosis not present

## 2024-05-24 DIAGNOSIS — I672 Cerebral atherosclerosis: Secondary | ICD-10-CM | POA: Diagnosis not present

## 2024-05-24 DIAGNOSIS — R531 Weakness: Secondary | ICD-10-CM | POA: Diagnosis not present

## 2024-05-24 DIAGNOSIS — R1310 Dysphagia, unspecified: Secondary | ICD-10-CM | POA: Diagnosis present

## 2024-05-24 DIAGNOSIS — I6389 Other cerebral infarction: Secondary | ICD-10-CM | POA: Diagnosis present

## 2024-05-24 DIAGNOSIS — I63431 Cerebral infarction due to embolism of right posterior cerebral artery: Secondary | ICD-10-CM | POA: Diagnosis not present

## 2024-05-24 DIAGNOSIS — R45851 Suicidal ideations: Secondary | ICD-10-CM | POA: Diagnosis not present

## 2024-05-24 DIAGNOSIS — I6502 Occlusion and stenosis of left vertebral artery: Secondary | ICD-10-CM | POA: Diagnosis not present

## 2024-05-24 DIAGNOSIS — F329 Major depressive disorder, single episode, unspecified: Secondary | ICD-10-CM | POA: Diagnosis not present

## 2024-05-24 DIAGNOSIS — F0390 Unspecified dementia without behavioral disturbance: Secondary | ICD-10-CM | POA: Insufficient documentation

## 2024-05-24 DIAGNOSIS — R451 Restlessness and agitation: Secondary | ICD-10-CM | POA: Diagnosis not present

## 2024-05-24 DIAGNOSIS — Z79899 Other long term (current) drug therapy: Secondary | ICD-10-CM

## 2024-05-24 DIAGNOSIS — Z7902 Long term (current) use of antithrombotics/antiplatelets: Secondary | ICD-10-CM | POA: Diagnosis not present

## 2024-05-24 DIAGNOSIS — R400 Somnolence: Secondary | ICD-10-CM | POA: Diagnosis not present

## 2024-05-24 DIAGNOSIS — H53462 Homonymous bilateral field defects, left side: Secondary | ICD-10-CM | POA: Diagnosis present

## 2024-05-24 DIAGNOSIS — N1831 Chronic kidney disease, stage 3a: Secondary | ICD-10-CM | POA: Diagnosis not present

## 2024-05-24 DIAGNOSIS — M159 Polyosteoarthritis, unspecified: Secondary | ICD-10-CM | POA: Diagnosis present

## 2024-05-24 DIAGNOSIS — F411 Generalized anxiety disorder: Secondary | ICD-10-CM | POA: Insufficient documentation

## 2024-05-24 DIAGNOSIS — R29708 NIHSS score 8: Secondary | ICD-10-CM | POA: Diagnosis not present

## 2024-05-24 DIAGNOSIS — F0154 Vascular dementia, unspecified severity, with anxiety: Secondary | ICD-10-CM | POA: Diagnosis present

## 2024-05-24 DIAGNOSIS — F419 Anxiety disorder, unspecified: Secondary | ICD-10-CM | POA: Diagnosis not present

## 2024-05-24 DIAGNOSIS — F01518 Vascular dementia, unspecified severity, with other behavioral disturbance: Secondary | ICD-10-CM | POA: Diagnosis present

## 2024-05-24 DIAGNOSIS — I6621 Occlusion and stenosis of right posterior cerebral artery: Secondary | ICD-10-CM

## 2024-05-24 DIAGNOSIS — I69391 Dysphagia following cerebral infarction: Secondary | ICD-10-CM | POA: Diagnosis not present

## 2024-05-24 DIAGNOSIS — F0153 Vascular dementia, unspecified severity, with mood disturbance: Secondary | ICD-10-CM | POA: Diagnosis present

## 2024-05-24 DIAGNOSIS — G459 Transient cerebral ischemic attack, unspecified: Secondary | ICD-10-CM | POA: Diagnosis not present

## 2024-05-24 DIAGNOSIS — Z96652 Presence of left artificial knee joint: Secondary | ICD-10-CM | POA: Diagnosis not present

## 2024-05-24 DIAGNOSIS — E1122 Type 2 diabetes mellitus with diabetic chronic kidney disease: Secondary | ICD-10-CM | POA: Diagnosis not present

## 2024-05-24 DIAGNOSIS — Z79890 Hormone replacement therapy (postmenopausal): Secondary | ICD-10-CM

## 2024-05-24 DIAGNOSIS — Z7982 Long term (current) use of aspirin: Secondary | ICD-10-CM | POA: Diagnosis not present

## 2024-05-24 DIAGNOSIS — I6523 Occlusion and stenosis of bilateral carotid arteries: Secondary | ICD-10-CM | POA: Diagnosis not present

## 2024-05-24 DIAGNOSIS — Z8249 Family history of ischemic heart disease and other diseases of the circulatory system: Secondary | ICD-10-CM

## 2024-05-24 DIAGNOSIS — I4892 Unspecified atrial flutter: Secondary | ICD-10-CM | POA: Diagnosis present

## 2024-05-24 DIAGNOSIS — G894 Chronic pain syndrome: Secondary | ICD-10-CM | POA: Diagnosis present

## 2024-05-24 DIAGNOSIS — R29704 NIHSS score 4: Secondary | ICD-10-CM | POA: Diagnosis not present

## 2024-05-24 DIAGNOSIS — R41842 Visuospatial deficit: Secondary | ICD-10-CM | POA: Diagnosis not present

## 2024-05-24 DIAGNOSIS — I63531 Cerebral infarction due to unspecified occlusion or stenosis of right posterior cerebral artery: Secondary | ICD-10-CM | POA: Diagnosis not present

## 2024-05-24 DIAGNOSIS — F32A Depression, unspecified: Secondary | ICD-10-CM | POA: Diagnosis not present

## 2024-05-24 DIAGNOSIS — F01511 Vascular dementia, unspecified severity, with agitation: Secondary | ICD-10-CM | POA: Diagnosis present

## 2024-05-24 DIAGNOSIS — E669 Obesity, unspecified: Secondary | ICD-10-CM | POA: Diagnosis present

## 2024-05-24 DIAGNOSIS — F0150 Vascular dementia without behavioral disturbance: Secondary | ICD-10-CM | POA: Diagnosis not present

## 2024-05-24 DIAGNOSIS — H5347 Heteronymous bilateral field defects: Secondary | ICD-10-CM | POA: Diagnosis not present

## 2024-05-24 DIAGNOSIS — I708 Atherosclerosis of other arteries: Secondary | ICD-10-CM | POA: Diagnosis not present

## 2024-05-24 DIAGNOSIS — R29818 Other symptoms and signs involving the nervous system: Secondary | ICD-10-CM | POA: Diagnosis not present

## 2024-05-24 DIAGNOSIS — I739 Peripheral vascular disease, unspecified: Secondary | ICD-10-CM | POA: Diagnosis not present

## 2024-05-24 HISTORY — PX: IR PERCUTANEOUS ART THROMBECTOMY/INFUSION INTRACRANIAL INC DIAG ANGIO: IMG6087

## 2024-05-24 HISTORY — PX: IR US GUIDE VASC ACCESS RIGHT: IMG2390

## 2024-05-24 HISTORY — PX: RADIOLOGY WITH ANESTHESIA: SHX6223

## 2024-05-24 HISTORY — PX: IR ANGIO VERTEBRAL SEL SUBCLAVIAN INNOMINATE UNI R MOD SED: IMG5365

## 2024-05-24 LAB — I-STAT CHEM 8, ED
BUN: 12 mg/dL (ref 8–23)
Calcium, Ion: 1.09 mmol/L — ABNORMAL LOW (ref 1.15–1.40)
Chloride: 105 mmol/L (ref 98–111)
Creatinine, Ser: 1.4 mg/dL — ABNORMAL HIGH (ref 0.44–1.00)
Glucose, Bld: 90 mg/dL (ref 70–99)
HCT: 40 % (ref 36.0–46.0)
Hemoglobin: 13.6 g/dL (ref 12.0–15.0)
Potassium: 3.7 mmol/L (ref 3.5–5.1)
Sodium: 143 mmol/L (ref 135–145)
TCO2: 26 mmol/L (ref 22–32)

## 2024-05-24 LAB — DIFFERENTIAL
Abs Immature Granulocytes: 0.02 K/uL (ref 0.00–0.07)
Basophils Absolute: 0 K/uL (ref 0.0–0.1)
Basophils Relative: 0 %
Eosinophils Absolute: 0.2 K/uL (ref 0.0–0.5)
Eosinophils Relative: 4 %
Immature Granulocytes: 0 %
Lymphocytes Relative: 37 %
Lymphs Abs: 2.2 K/uL (ref 0.7–4.0)
Monocytes Absolute: 0.7 K/uL (ref 0.1–1.0)
Monocytes Relative: 11 %
Neutro Abs: 2.9 K/uL (ref 1.7–7.7)
Neutrophils Relative %: 48 %

## 2024-05-24 LAB — PROTIME-INR
INR: 1 (ref 0.8–1.2)
Prothrombin Time: 13.5 s (ref 11.4–15.2)

## 2024-05-24 LAB — COMPREHENSIVE METABOLIC PANEL WITH GFR
ALT: 17 U/L (ref 0–44)
AST: 20 U/L (ref 15–41)
Albumin: 3.3 g/dL — ABNORMAL LOW (ref 3.5–5.0)
Alkaline Phosphatase: 91 U/L (ref 38–126)
Anion gap: 7 (ref 5–15)
BUN: 11 mg/dL (ref 8–23)
CO2: 27 mmol/L (ref 22–32)
Calcium: 9 mg/dL (ref 8.9–10.3)
Chloride: 108 mmol/L (ref 98–111)
Creatinine, Ser: 1.28 mg/dL — ABNORMAL HIGH (ref 0.44–1.00)
GFR, Estimated: 41 mL/min — ABNORMAL LOW (ref >60)
Glucose, Bld: 93 mg/dL (ref 70–99)
Potassium: 3.7 mmol/L (ref 3.5–5.1)
Sodium: 142 mmol/L (ref 135–145)
Total Bilirubin: 0.8 mg/dL (ref 0.0–1.2)
Total Protein: 6.2 g/dL — ABNORMAL LOW (ref 6.5–8.1)

## 2024-05-24 LAB — CBC
HCT: 40.1 % (ref 36.0–46.0)
Hemoglobin: 13.2 g/dL (ref 12.0–15.0)
MCH: 30.1 pg (ref 26.0–34.0)
MCHC: 32.9 g/dL (ref 30.0–36.0)
MCV: 91.6 fL (ref 80.0–100.0)
Platelets: 294 K/uL (ref 150–400)
RBC: 4.38 MIL/uL (ref 3.87–5.11)
RDW: 14.8 % (ref 11.5–15.5)
WBC: 6.1 K/uL (ref 4.0–10.5)
nRBC: 0 % (ref 0.0–0.2)

## 2024-05-24 LAB — HEMOGLOBIN A1C
Hgb A1c MFr Bld: 5.4 % (ref 4.8–5.6)
Mean Plasma Glucose: 108.28 mg/dL

## 2024-05-24 LAB — MRSA NEXT GEN BY PCR, NASAL: MRSA by PCR Next Gen: NOT DETECTED

## 2024-05-24 LAB — ETHANOL: Alcohol, Ethyl (B): <15 mg/dL (ref <15)

## 2024-05-24 LAB — CBG MONITORING, ED: Glucose-Capillary: 90 mg/dL (ref 70–99)

## 2024-05-24 LAB — APTT: aPTT: 29 s (ref 24–36)

## 2024-05-24 SURGERY — RADIOLOGY WITH ANESTHESIA
Anesthesia: General

## 2024-05-24 MED ORDER — IOHEXOL 350 MG/ML SOLN
75.0000 mL | Freq: Once | INTRAVENOUS | Status: AC | PRN
  Administered 2024-05-24: 75 mL via INTRAVENOUS

## 2024-05-24 MED ORDER — SUGAMMADEX SODIUM 200 MG/2ML IV SOLN
INTRAVENOUS | Status: DC | PRN
Start: 2024-05-24 — End: 2024-05-24
  Administered 2024-05-24: 200 mg via INTRAVENOUS

## 2024-05-24 MED ORDER — TENECTEPLASE FOR STROKE
0.2500 mg/kg | PACK | Freq: Once | INTRAVENOUS | Status: AC
  Administered 2024-05-24: 19 mg via INTRAVENOUS
  Filled 2024-05-24: qty 10

## 2024-05-24 MED ORDER — ACETAMINOPHEN 325 MG PO TABS
650.0000 mg | ORAL_TABLET | ORAL | Status: DC | PRN
  Administered 2024-05-27 – 2024-05-29 (×2): 650 mg via ORAL
  Filled 2024-05-24 (×3): qty 2

## 2024-05-24 MED ORDER — CLEVIDIPINE BUTYRATE 0.5 MG/ML IV EMUL
0.0000 mg/h | INTRAVENOUS | Status: DC
  Administered 2024-05-24: 2 mg/h via INTRAVENOUS
  Administered 2024-05-25: 8 mg/h via INTRAVENOUS
  Filled 2024-05-24: qty 50

## 2024-05-24 MED ORDER — CLEVIDIPINE BUTYRATE 0.5 MG/ML IV EMUL
INTRAVENOUS | Status: AC
Start: 2024-05-24 — End: 2024-05-24
  Filled 2024-05-24: qty 100

## 2024-05-24 MED ORDER — ROCURONIUM BROMIDE 10 MG/ML (PF) SYRINGE
PREFILLED_SYRINGE | INTRAVENOUS | Status: DC | PRN
  Administered 2024-05-24: 50 mg via INTRAVENOUS

## 2024-05-24 MED ORDER — SODIUM CHLORIDE 0.9 % IV BOLUS
250.0000 mL | INTRAVENOUS | Status: AC | PRN
  Administered 2024-05-24: 250 mL via INTRAVENOUS

## 2024-05-24 MED ORDER — ONDANSETRON HCL 4 MG/2ML IJ SOLN
4.0000 mg | Freq: Once | INTRAMUSCULAR | Status: AC
Start: 2024-05-24 — End: 2024-05-24
  Administered 2024-05-24: 4 mg via INTRAVENOUS

## 2024-05-24 MED ORDER — IOHEXOL 300 MG/ML  SOLN
150.0000 mL | Freq: Once | INTRAMUSCULAR | Status: AC | PRN
  Administered 2024-05-24: 42 mL via INTRA_ARTERIAL

## 2024-05-24 MED ORDER — CHLORHEXIDINE GLUCONATE CLOTH 2 % EX PADS
6.0000 | MEDICATED_PAD | Freq: Every day | CUTANEOUS | Status: DC
  Administered 2024-05-24 – 2024-05-30 (×6): 6 via TOPICAL

## 2024-05-24 MED ORDER — ACETAMINOPHEN 160 MG/5ML PO SOLN: 650.0000 mg | ORAL | Status: DC | PRN

## 2024-05-24 MED ORDER — PHENYLEPHRINE HCL-NACL 20-0.9 MG/250ML-% IV SOLN
INTRAVENOUS | Status: DC | PRN
  Administered 2024-05-24: 40 ug/min via INTRAVENOUS

## 2024-05-24 MED ORDER — LABETALOL HCL 5 MG/ML IV SOLN
10.0000 mg | Freq: Once | INTRAVENOUS | Status: AC
  Administered 2024-05-24: 10 mg via INTRAVENOUS

## 2024-05-24 MED ORDER — ONDANSETRON HCL 4 MG/2ML IJ SOLN
INTRAMUSCULAR | Status: DC | PRN
  Administered 2024-05-24: 4 mg via INTRAVENOUS

## 2024-05-24 MED ORDER — LABETALOL HCL 5 MG/ML IV SOLN
INTRAVENOUS | Status: DC | PRN
Start: 2024-05-24 — End: 2024-05-24
  Administered 2024-05-24: 10 mg via INTRAVENOUS

## 2024-05-24 MED ORDER — SENNOSIDES-DOCUSATE SODIUM 8.6-50 MG PO TABS
1.0000 | ORAL_TABLET | Freq: Every evening | ORAL | Status: DC | PRN
  Administered 2024-05-25: 1 via ORAL
  Filled 2024-05-24: qty 1

## 2024-05-24 MED ORDER — PROPOFOL 10 MG/ML IV BOLUS
INTRAVENOUS | Status: DC | PRN
  Administered 2024-05-24: 100 mg via INTRAVENOUS

## 2024-05-24 MED ORDER — ONDANSETRON HCL 4 MG/2ML IJ SOLN
INTRAMUSCULAR | Status: AC
Start: 2024-05-24 — End: 2024-05-24
  Filled 2024-05-24: qty 2

## 2024-05-24 MED ORDER — PANTOPRAZOLE SODIUM 40 MG IV SOLR: 40.0000 mg | Freq: Every day | INTRAVENOUS | Status: DC

## 2024-05-24 MED ORDER — STROKE: EARLY STAGES OF RECOVERY BOOK
Freq: Once | Status: AC
  Filled 2024-05-24: qty 1

## 2024-05-24 MED ORDER — FENTANYL CITRATE (PF) 250 MCG/5ML IJ SOLN
INTRAMUSCULAR | Status: DC | PRN
  Administered 2024-05-24 (×2): 50 ug via INTRAVENOUS

## 2024-05-24 MED ORDER — PHENYLEPHRINE 80 MCG/ML (10ML) SYRINGE FOR IV PUSH (FOR BLOOD PRESSURE SUPPORT)
PREFILLED_SYRINGE | INTRAVENOUS | Status: DC | PRN
  Administered 2024-05-24 (×2): 80 ug via INTRAVENOUS

## 2024-05-24 MED ORDER — FENTANYL CITRATE (PF) 100 MCG/2ML IJ SOLN
INTRAMUSCULAR | Status: AC
  Filled 2024-05-24: qty 2

## 2024-05-24 MED ORDER — LACTATED RINGERS IV SOLN: INTRAVENOUS | Status: DC | PRN

## 2024-05-24 MED ORDER — ONDANSETRON HCL 4 MG/2ML IJ SOLN
INTRAMUSCULAR | Status: AC
  Filled 2024-05-24: qty 2

## 2024-05-24 MED ORDER — ACETAMINOPHEN 650 MG RE SUPP: 650.0000 mg | RECTAL | Status: DC | PRN

## 2024-05-24 MED ORDER — ONDANSETRON HCL 4 MG/2ML IJ SOLN
4.0000 mg | Freq: Once | INTRAMUSCULAR | Status: AC
  Administered 2024-05-24: 4 mg via INTRAVENOUS

## 2024-05-24 MED ORDER — DEXAMETHASONE SOD PHOSPHATE PF 10 MG/ML IJ SOLN
INTRAMUSCULAR | Status: DC | PRN
  Administered 2024-05-24: 5 mg via INTRAVENOUS

## 2024-05-24 MED ORDER — SODIUM CHLORIDE 0.9 % IV SOLN: INTRAVENOUS | Status: AC

## 2024-05-24 MED ORDER — SODIUM CHLORIDE 0.9% FLUSH
3.0000 mL | Freq: Once | INTRAVENOUS | Status: AC
  Administered 2024-05-24: 3 mL via INTRAVENOUS

## 2024-05-24 MED ORDER — LIDOCAINE 2% (20 MG/ML) 5 ML SYRINGE
INTRAMUSCULAR | Status: DC | PRN
  Administered 2024-05-24: 60 mg via INTRAVENOUS

## 2024-05-24 NOTE — Transfer of Care (Signed)
 Immediate Anesthesia Transfer of Care Note  Patient: Donna Petty  Procedure(s) Performed: RADIOLOGY WITH ANESTHESIA  Patient Location: PACU  Anesthesia Type:General  Level of Consciousness: awake, alert , and oriented  Airway & Oxygen Therapy: Patient Spontanous Breathing and Patient connected to nasal cannula oxygen  Post-op Assessment: Report given to RN and Post -op Vital signs reviewed and stable  Post vital signs: Reviewed and stable  Last Vitals:  Vitals Value Taken Time  BP 182/55 05/24/24 13:57  Temp 97.3   Pulse 61 05/24/24 14:01  Resp 21 05/24/24 14:01  SpO2 78 % 05/24/24 14:01  Vitals shown include unfiled device data.  Last Pain: There were no vitals filed for this visit.       Complications: No notable events documented.

## 2024-05-24 NOTE — Anesthesia Procedure Notes (Signed)
 Procedure Name: Intubation Date/Time: 05/24/2024 11:54 AM  Performed by: Scherrie Mast, CRNAPre-anesthesia Checklist: Patient identified, Emergency Drugs available, Suction available and Patient being monitored Patient Re-evaluated:Patient Re-evaluated prior to induction Oxygen Delivery Method: Circle System Utilized Preoxygenation: Pre-oxygenation with 100% oxygen Induction Type: IV induction Ventilation: Mask ventilation without difficulty Laryngoscope Size: Mac and 3 Grade View: Grade I Tube type: Oral Tube size: 7.0 mm Number of attempts: 1 Airway Equipment and Method: Stylet and Oral airway Placement Confirmation: ETT inserted through vocal cords under direct vision, positive ETCO2 and breath sounds checked- equal and bilateral Secured at: 21 cm Tube secured with: Tape Dental Injury: Teeth and Oropharynx as per pre-operative assessment

## 2024-05-24 NOTE — Progress Notes (Signed)
 Pt arrived to ICU w/ BP 78/62 (66) at 1500 on 4 mg of cleviprex. SBP goal 120-160 per cleviprex order. Cleviprex turned off at 1500.  Repeat BP at 1503 103/88 (93). 250 mL NS bolus given per order for SBP<120 at 1507.  1516 BP 123/70 (87). SABRA

## 2024-05-24 NOTE — Anesthesia Preprocedure Evaluation (Addendum)
 Anesthesia Evaluation  Patient identified by MRN, date of birth, ID band Patient awake    Reviewed: Allergy & Precautions, NPO status , Patient's Chart, lab work & pertinent test results  Airway Mallampati: II  TM Distance: >3 FB Neck ROM: Full    Dental no notable dental hx. (+) Teeth Intact, Dental Advisory Given   Pulmonary shortness of breath   Pulmonary exam normal breath sounds clear to auscultation       Cardiovascular hypertension, Normal cardiovascular exam Rhythm:Regular Rate:Normal     Neuro/Psych  PSYCHIATRIC DISORDERS  Depression   Dementia CVA    GI/Hepatic Neg liver ROS,GERD  ,,  Endo/Other  Hypothyroidism    Renal/GU negative Renal ROS  negative genitourinary   Musculoskeletal  (+) Arthritis ,    Abdominal   Peds  Hematology negative hematology ROS (+)   Anesthesia Other Findings Code stroke  Reproductive/Obstetrics                              Anesthesia Physical Anesthesia Plan  ASA: 3 and emergent  Anesthesia Plan: General   Post-op Pain Management:    Induction: Intravenous and Rapid sequence  PONV Risk Score and Plan: 3 and Treatment may vary due to age or medical condition  Airway Management Planned: Oral ETT  Additional Equipment:   Intra-op Plan:   Post-operative Plan: Extubation in OR  Informed Consent: I have reviewed the patients History and Physical, chart, labs and discussed the procedure including the risks, benefits and alternatives for the proposed anesthesia with the patient or authorized representative who has indicated his/her understanding and acceptance.     Dental advisory given  Plan Discussed with: CRNA  Anesthesia Plan Comments:          Anesthesia Quick Evaluation

## 2024-05-24 NOTE — Sedation Documentation (Signed)
 Two code strokes at one time, waiting on [facility] MD and CRNA.

## 2024-05-24 NOTE — Brief Op Note (Signed)
  NEUROSURGERY BRIEF THROMBECTOMY NOTE   PREOP DX: Right PCA occlusion  POSTOP DX: Same  PROCEDURE: Cerebral arteriogram, attempted thrombectomy  SURGEON: Nancyann LULLA Burns   ANESTHESIA: GETA  EBL: Minimal  Number of Passes: 0  Technique: N/A  Final TICI score: 0  Post OP blood pressure goal: SBP<160  Arterial Angioplasty or Stent: No   Anti-Platelet Therapy: No   COMPLICATIONS: No   CONDITION: Stable to recovery  FINDINGS (Full report in [facility]): 1.  Right PCA occlusion.  Unable to do thrombectomy due to extreme vertebral artery tortuosity preventing adequate access   Nancyann LULLA Burns  @today @ 1:41 PM

## 2024-05-24 NOTE — Sedation Documentation (Addendum)
 One vial of fentanyl  (100mg ) given to Violeta S, CRNA by Cathlyn SAILOR, RN.

## 2024-05-24 NOTE — ED Provider Notes (Signed)
 [place] [facility] Provider Note   CSN: [id number] Arrival date & time: 05/24/24  1053     History  Chief Complaint  Patient presents with   Code Stroke    Donna Petty is a 88 y.o. female with PMH as listed below who presents with HTN, HLD, dementia, chronic pain syndrome, L-sided carotid artery disease, hypothyroidism, macular degeneration who p/w code stroke. Per the daughter, patient reportedly got up this morning and walk from the bed into the living room, seem to be at her baseline around 9:30 AM, but then the patient was unable to get up from the table at 9:45 AM and was not as responsive, leaning to the left and notably had a right gaze deviation.  Brought in by EMS as a code stroke.  Hypertensive to the 190s systolic.  On arrival to the ED noted to have right gaze deviation, left hemianopsia, with left-sided extinction..    Past Medical History:  Diagnosis Date   Acid reflux    Arthritis    Carotid artery disease    50-70% left internal carotid artery stenosis   Depression    Dyspnea    Hypothyroidism    Macular degeneration    Thyroid  condition    Visual changes        Home Medications Prior to Admission medications   Medication Sig Start Date End Date Taking? Authorizing Provider  ADVIL 200 MG CAPS Take 200-400 mg by mouth 2 (two) times daily as needed (for pain).    [provider]  Calcium -Vitamin D 600-200 MG-UNIT per tablet Take 1 tablet by mouth daily. Patient not taking: Reported on 01/26/2024    [provider]  cetirizine (ZYRTEC) 10 MG tablet Take 10 mg by mouth daily. Patient not taking: Reported on 01/26/2024    [provider]  chlorhexidine  (HIBICLENS ) 4 % external liquid Apply 15 mLs (1 Application total) topically as directed for 30 doses. Use as directed daily for 5 days every other week for 6 weeks. 01/26/24   Patti Rosina SAUNDERS, PA-C  Docusate Calcium  (STOOL SOFTENER PO) Take 1  capsule by mouth every other day.    [provider]  ezetimibe  (ZETIA ) 10 MG tablet Take 10 mg by mouth every other day. Patient not taking: Reported on 01/26/2024    [provider]  furosemide  (LASIX ) 40 MG tablet Take 40 mg by mouth daily. Patient not taking: Reported on 01/26/2024    [provider]  glucosamine-chondroitin 500-400 MG tablet Take 1 tablet by mouth daily. Patient not taking: Reported on 01/26/2024    [provider]  levothyroxine  (SYNTHROID ) 100 MCG tablet Take 100 mcg by mouth daily before breakfast.    [provider]  memantine  (NAMENDA ) 5 MG tablet Take 5 mg by mouth 2 (two) times daily.    [provider]  methocarbamol  (ROBAXIN ) 500 MG tablet Take 1 tablet (500 mg total) by mouth every 8 (eight) hours as needed for muscle spasms. 01/29/24   Patti Rosina SAUNDERS, PA-C  mirtazapine  (REMERON ) 15 MG tablet Take 15 mg by mouth at bedtime.    [provider]  Multiple Vitamin (MULTIVITAMIN WITH MINERALS) TABS tablet Take 1 tablet by mouth daily. Patient not taking: Reported on 01/26/2024    [provider]  Multiple Vitamins-Minerals (ICAPS AREDS 2 PO) Take 1 capsule by mouth 2 (two) times daily.    [provider]  omeprazole  (PRILOSEC) 40 MG capsule Take 40 mg by mouth  daily before breakfast. 04/15/14   [provider]  polyethylene glycol (MIRALAX  / GLYCOLAX ) 17 g packet Take 17 g by mouth 2 (two) times daily. 01/29/24   Patti Rosina SAUNDERS, PA-C  potassium chloride  (KLOR-CON  M) 10 MEQ tablet Take 5 mEq by mouth every evening.    [provider]  traMADol  (ULTRAM ) 50 MG tablet Take 1 tablet (50 mg total) by mouth every 6 (six) hours as needed for severe pain (pain score 7-10). 01/29/24   Patti Rosina SAUNDERS, PA-C  TYLENOL  500 MG tablet Take 500 mg by mouth 2 (two) times daily as needed (for pain).    [provider]      Allergies    Patient has no known allergies.    Review  of Systems   Review of Systems A 10 point review of systems was performed and is negative unless otherwise reported in HPI.  Physical Exam Updated Vital Signs Wt 77.4 kg   BMI 31.72 kg/m  Physical Exam General: Normal appearing elderly female, lying in bed.  HEENT: R gaze deviation, Sclera anicteric, MMM, trachea midline.  Cardiology: RRR, no murmurs/rubs/gallops.  Resp: Normal respiratory rate and effort. CTAB, no wheezes, rhonchi, crackles.  Abd: Soft, non-tender, non-distended. No rebound tenderness or guarding.  GU: Deferred. MSK: No peripheral edema or signs of trauma. Extremities without deformity Skin: warm, dry.  Neuro: A&Ox3, R gaze deviation w/ no facial droop. L hemianopsia.  5/5 strength in all extremities. Sensation grossly intact. Normal speech.    1a  Level of consciousness: 0=alert; keenly responsive  1b. LOC questions:  0=Performs both tasks correctly  1c. LOC commands: 0=Performs both tasks correctly  2.  Best Gaze: 2=forced deviation, or total gaze paresis not overcome by oculocephalic maneuver  3.  Visual: 2=Complete hemianopia  4. Facial Palsy: 0=Normal symmetric movement  5a.  Motor left arm: 0=No drift, limb holds 90 (or 45) degrees for full 10 seconds  5b.  Motor right arm: 0=No drift, limb holds 90 (or 45) degrees for full 10 seconds  6a. motor left leg: 0=No drift, limb holds 90 (or 45) degrees for full 10 seconds  6b  Motor right leg:  0=No drift, limb holds 90 (or 45) degrees for full 10 seconds  7. Limb Ataxia: 2  8.  Sensory: 0=Normal; no sensory loss  9. Best Language:  0=No aphasia, normal  10. Dysarthria: 0=Normal  11. Extinction and Inattention: 1=Visual, tactile, auditory, spatial or personal inattention or extinction to bilateral simultaneous stimulation in one of the sensory modalities   Total:   7        ED Results / Procedures / Treatments   Labs (all labs ordered are listed, but only abnormal results are displayed) Labs Reviewed   I-STAT CHEM 8, ED - Abnormal; Notable for the following components:      Result Value   Creatinine, Ser 1.40 (*)    Calcium , Ion 1.09 (*)    All other components within normal limits  PROTIME-INR  APTT  CBC  DIFFERENTIAL  COMPREHENSIVE METABOLIC PANEL WITH GFR  ETHANOL  CBG MONITORING, ED  I-STAT CHEM 8, ED  CBG MONITORING, ED    EKG None  Radiology CT HEAD CODE STROKE WO CONTRAST Result Date: 05/24/2024 EXAM: CT HEAD WITHOUT CONTRAST 05/24/2024 10:57:56 AM TECHNIQUE: CT of the head was performed without the administration of intravenous contrast. Automated exposure control, iterative reconstruction, and/or weight based adjustment of the mA/kV was utilized to reduce the radiation dose to as low as  reasonably achievable. COMPARISON: Head CT 829 23. CLINICAL HISTORY: 88 year old female with acute neuro deficit, stroke suspected, right gaze, left side weakness, LKW 1000. FINDINGS: BRAIN AND VENTRICLES: No acute hemorrhage. No evidence of acute infarct. Chronic lacunar infarct of the right basal ganglia is stable. Brain volume and ventricle size are stable. Chronic basal ganglia vascular calcifications. Mild additional patchy and scattered white matter hypodensity for age as not significantly changed. No hydrocephalus. No extra-axial collection. No mass effect or midline shift. No suspicious intracranial vascular hyperdensity. ORBITS: No acute abnormality. SINUSES: No acute abnormality. SOFT TISSUES AND SKULL: Mild rightward gaze. No acute soft tissue abnormality. No skull fracture. [place] stroke program early CT score (aspects) ----- Ganglionic (caudate, ic, lentiform nucleus, insula, M1-m3): 7 Supraganglionic (m4-m6): 3 Total: 10 IMPRESSION: 1. ASPECTS 10.  Stable chronic small vessel disease by CT. 2. These results were communicated to Dr. Vanessa at 11:04 hours on 05/24/2024 by text page via the [facility] messaging system. Electronically signed by: Helayne Hurst MD 05/24/2024 11:06 AM EDT RP  Workstation: [id number]    Procedures Procedures    Medications Ordered in ED Medications  sodium chloride  flush (NS) 0.9 % injection 3 mL (has no administration in time range)  tenecteplase (TNKASE) injection for Stroke 19 mg (has no administration in time range)  iohexol (OMNIPAQUE) 350 MG/ML injection 75 mL (75 mLs Intravenous Contrast Given 05/24/24 1112)    ED Course/ Medical Decision Making/ A&P                          Medical Decision Making Amount and/or Complexity of Data Reviewed Labs: ordered. Radiology: ordered. Decision-making details documented in ED Course.  Risk Decision regarding hospitalization.    This patient presents to the ED for concern of code stroke, this involves an extensive number of treatment options, and is a complaint that carries with it a high risk of complications and morbidity.  I considered the following differential and admission for this acute, potentially life threatening condition.   MDM:    Given the acute onset of neurological symptoms, stroke is the most concerning etiology of these acute symptoms. The neuro exam is significant for R gaze deviation and L hemianopsia. Neurology team evaluating patient at bedside. Plan to obtain emergent CT brain.  LKN: 0930 AM Glucose: 90 AC: No BP: 190s systolic  CTH neg for ICH. CT angio the head and neck demonstrated right PCA P1 occlusion.  Dr. Lester with neurointerventional radiology discussed risks and benefits of thrombectomy and the emergent nature of the procedure.  Given the disabling nature of her vision loss, neurology discussed with daughter who agreed to thrombectomy.    Clinical Course as of 05/24/24 1134  Tue May 24, 2024  1115 CT HEAD CODE STROKE WO CONTRAST 1. ASPECTS 10.  Stable chronic small vessel disease by CT. 2. These results were communicated to Dr. Vanessa at 11:04 hours on 05/24/2024 by text page via the [facility] messaging system.   [HN]    Clinical Course User  Index [HN] Franklyn Sid SAILOR, MD    Labs: I Ordered, and personally interpreted labs.  The pertinent results include:  those listed above  Imaging Studies ordered: CTH, CTA H&N I independently visualized and interpreted imaging. I agree with the radiologist interpretation  Additional history obtained from EMS,c hart review.    Social Determinants of Health: Lives in independent living  Disposition:  given TNK, dispo'ed to IR  Co morbidities that complicate the  patient evaluation  Past Medical History:  Diagnosis Date   Acid reflux    Arthritis    Carotid artery disease    50-70% left internal carotid artery stenosis   Depression    Dyspnea    Hypothyroidism    Macular degeneration    Thyroid  condition    Visual changes      Medicines Meds ordered this encounter  Medications   sodium chloride  flush (NS) 0.9 % injection 3 mL   tenecteplase (TNKASE) injection for Stroke 19 mg   iohexol (OMNIPAQUE) 350 MG/ML injection 75 mL    I have reviewed the patients home medicines and have made adjustments as needed  Problem List / ED Course: Problem List Items Addressed This Visit   None Visit Diagnoses       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    Franklyn Sid SAILOR, MD 05/28/24 [phone]

## 2024-05-24 NOTE — H&P (Signed)
 NEUROLOGY H&P NOTE   Date of service: May 24, 2024 Patient Name: Donna Petty MRN:  [id number] DOB:  [birth year] Chief Complaint: Left hemianopsia and extinction  History of Present Illness  Donna Petty is a 88 y.o. female with hx of hypothyroidism, macular degeneration, carotid artery disease, depression who was at home with her daughter when she had sudden onset left-sided symptoms.  Per daughter over the phone, patient got up this morning and walked from the bed and sat on the couch in the living room.  She had no difficulties and seemed to be at her baseline.  This was around 9:30 AM.  Daughter got busy making breakfast and when she was done, called out for her to come over to the table.  This was around 9 45 a.m.SABRA  She noticed that the patient was unable to get up and was not as responsive.  Daughter immediately called 911 and when EMS got there, they noticed that she was leaning to the left and had a right gaze deviation.  Patient was immediately brought in as a code stroke.  She was hypertensive to 190 systolic.  She had continued right gaze and left hemianopsia with left-sided extinction on arrival to the ED.  Stat CT head without contrast with no hemorrhage or large territory infarct.  Aspects of 10.  I discussed TNKase with daughter including about a 30% chance of improvement in her symptoms and 3 to 5% risk of bleeding with slightly increased risk given her age is over 85.  Daughter eventually consented to [facility] after discussion with other family members.  There was delay to TNKase administration as daughter wanted to take some time before making decision.  Patient also required a small dose of labetalol as her blood pressure was still elevated prior to TNKase administration.  Just prior to TNK, patient had worsened and had developed left complete sensory loss.  CT angio the head and neck demonstrated right PCA P1 occlusion.  Dr. Lester with neurointerventional radiology discussed  risks and benefits of thrombectomy and the emergent nature of the procedure.  Given the disabling nature of her vision loss, daughter agreed to thrombectomy.  Last known well: 9:30 AM on 05/24/2024 Modified rankin score: 1-No significant post stroke disability and can perform usual duties with stroke symptoms IV Thrombolysis: Yes. Thrombectomy: Yes.  NIHSS components Score: Comment  1a Level of Conscious 0[x]  1[]  2[]  3[]      1b LOC Questions 0[x]  1[]  2[]       1c LOC Commands 0[x]  1[]  2[]       2 Best Gaze 0[]  1[]  2[x]       3 Visual 0[]  1[]  2[x]  3[]      4 Facial Palsy 0[x]  1[]  2[]  3[]      5a Motor Arm - left 0[x]  1[]  2[]  3[]  4[]  UN[]    5b Motor Arm - Right 0[x]  1[]  2[]  3[]  4[]  UN[]    6a Motor Leg - Left 0[x]  1[]  2[]  3[]  4[]  UN[]    6b Motor Leg - Right 0[x]  1[]  2[]  3[]  4[]  UN[]    7 Limb Ataxia 0[]  1[]  2[x]  UN[]      8 Sensory 0[x]  1[]  2[]  UN[]      9 Best Language 0[x]  1[]  2[]  3[]      10 Dysarthria 0[x]  1[]  2[]  UN[]      11 Extinct. and Inattention 0[]  1[x]  2[]     Left upper extremity lower extremity extinction  TOTAL: 7      ROS  Comprehensive ROS performed and pertinent positives documented in  the HPI  Past History   Past Medical History:  Diagnosis Date   Acid reflux    Arthritis    Carotid artery disease    50-70% left internal carotid artery stenosis   Depression    Dyspnea    Hypothyroidism    Macular degeneration    Thyroid  condition    Visual changes    Past Surgical History:  Procedure Laterality Date   CATARACT EXTRACTION, BILATERAL     CHOLECYSTECTOMY     COLONOSCOPY     ESOPHAGOGASTRODUODENOSCOPY ENDOSCOPY     TOTAL HIP ARTHROPLASTY Left 01/26/2024   Procedure: ARTHROPLASTY, HIP, TOTAL, ANTERIOR APPROACH;  Surgeon: Ernie Cough, MD;  Location: [facility];  Service: Orthopedics;  Laterality: Left;   TOTAL KNEE ARTHROPLASTY Left 06/26/2020   Procedure: TOTAL KNEE ARTHROPLASTY;  Surgeon: Ernie Cough, MD;  Location: [facility];  Service: Orthopedics;  Laterality:  Left;  70 mins   No family history on file. Social History   Socioeconomic History   Marital status: Widowed    Spouse name: Not on file   Number of children: Not on file   Years of education: Not on file   Highest education level: Not on file  Occupational History   Not on file  Tobacco Use   Smoking status: Never   Smokeless tobacco: Never  Vaping Use   Vaping status: Never Used  Substance and Sexual Activity   Alcohol use: No   Drug use: No   Sexual activity: Not on file  Other Topics Concern   Not on file  Social History Narrative   Not on file   Social Drivers of Health   Financial Resource Strain: Not on file  Food Insecurity: No Food Insecurity (01/26/2024)   Hunger Vital Sign    Worried About Running Out of Food in the Last Year: Never true    Ran Out of Food in the Last Year: Never true  Transportation Needs: No Transportation Needs (01/26/2024)   PRAPARE - Administrator, Civil Service (Medical): No    Lack of Transportation (Non-Medical): No  Physical Activity: Not on file  Stress: Not on file  Social Connections: Moderately Integrated (01/26/2024)   Social Connection and Isolation Panel    Frequency of Communication with Friends and Family: Three times a week    Frequency of Social Gatherings with Friends and Family: Three times a week    Attends Religious Services: More than 4 times per year    Active Member of Clubs or Organizations: Yes    Attends Banker Meetings: More than 4 times per year    Marital Status: Widowed   No Known Allergies  Medications   Current Facility-Administered Medications:    [START ON 05/25/2024]  stroke: early stages of recovery book, , Does not apply, Once, Dashonda Bonneau, MD   0.9 %  sodium chloride  infusion, , Intravenous, Continuous, Alanta Scobey, MD   acetaminophen  (TYLENOL ) tablet 650 mg, 650 mg, Oral, Q4H PRN **OR** acetaminophen  (TYLENOL ) 160 MG/5ML solution 650 mg, 650 mg, Per Tube,  Q4H PRN **OR** acetaminophen  (TYLENOL ) suppository 650 mg, 650 mg, Rectal, Q4H PRN, Lilli Dewald, MD   labetalol (NORMODYNE) injection 10 mg, 10 mg, Intravenous, Once, Judithe Longs C, NP   pantoprazole  (PROTONIX ) injection 40 mg, 40 mg, Intravenous, QHS, Zannie Locastro, MD   senna-docusate (Senokot-S) tablet 1 tablet, 1 tablet, Oral, QHS PRN, Trinetta Alemu, MD   sodium chloride  flush (NS) 0.9 % injection 3 mL, 3 mL, Intravenous,  Once, Franklyn Sid SAILOR, MD   tenecteplase (TNKASE) injection for Stroke 19 mg, 0.25 mg/kg, Intravenous, Once, Oral Remache, MD  Current Outpatient Medications:    ADVIL 200 MG CAPS, Take 200-400 mg by mouth 2 (two) times daily as needed (for pain)., Disp: , Rfl:    Calcium -Vitamin D 600-200 MG-UNIT per tablet, Take 1 tablet by mouth daily. (Patient not taking: Reported on 01/26/2024), Disp: , Rfl:    cetirizine (ZYRTEC) 10 MG tablet, Take 10 mg by mouth daily. (Patient not taking: Reported on 01/26/2024), Disp: , Rfl:    chlorhexidine  (HIBICLENS ) 4 % external liquid, Apply 15 mLs (1 Application total) topically as directed for 30 doses. Use as directed daily for 5 days every other week for 6 weeks., Disp: 946 mL, Rfl: 1   Docusate Calcium  (STOOL SOFTENER PO), Take 1 capsule by mouth every other day., Disp: , Rfl:    ezetimibe  (ZETIA ) 10 MG tablet, Take 10 mg by mouth every other day. (Patient not taking: Reported on 01/26/2024), Disp: , Rfl:    furosemide  (LASIX ) 40 MG tablet, Take 40 mg by mouth daily. (Patient not taking: Reported on 01/26/2024), Disp: , Rfl:    glucosamine-chondroitin 500-400 MG tablet, Take 1 tablet by mouth daily. (Patient not taking: Reported on 01/26/2024), Disp: , Rfl:    levothyroxine  (SYNTHROID ) 100 MCG tablet, Take 100 mcg by mouth daily before breakfast., Disp: , Rfl:    memantine  (NAMENDA ) 5 MG tablet, Take 5 mg by mouth 2 (two) times daily., Disp: , Rfl:    methocarbamol  (ROBAXIN ) 500 MG tablet, Take 1 tablet (500 mg total) by  mouth every 8 (eight) hours as needed for muscle spasms., Disp: 40 tablet, Rfl: 1   mirtazapine  (REMERON ) 15 MG tablet, Take 15 mg by mouth at bedtime., Disp: , Rfl:    Multiple Vitamin (MULTIVITAMIN WITH MINERALS) TABS tablet, Take 1 tablet by mouth daily. (Patient not taking: Reported on 01/26/2024), Disp: , Rfl:    Multiple Vitamins-Minerals (ICAPS AREDS 2 PO), Take 1 capsule by mouth 2 (two) times daily., Disp: , Rfl:    omeprazole  (PRILOSEC) 40 MG capsule, Take 40 mg by mouth daily before breakfast., Disp: , Rfl:    polyethylene glycol (MIRALAX  / GLYCOLAX ) 17 g packet, Take 17 g by mouth 2 (two) times daily., Disp: 14 each, Rfl: 0   potassium chloride  (KLOR-CON  M) 10 MEQ tablet, Take 5 mEq by mouth every evening., Disp: , Rfl:    traMADol  (ULTRAM ) 50 MG tablet, Take 1 tablet (50 mg total) by mouth every 6 (six) hours as needed for severe pain (pain score 7-10)., Disp: 30 tablet, Rfl: 0   TYLENOL  500 MG tablet, Take 500 mg by mouth 2 (two) times daily as needed (for pain)., Disp: , Rfl:    Vitals   Vitals:   05/24/24 1000 05/24/24 1107  BP:  (!) 256/161  Weight: 77.4 kg      Body mass index is 31.72 kg/m.  Physical Exam   General: Laying comfortably in bed; in no acute distress.  HENT: Normal oropharynx and mucosa. Normal external appearance of ears and nose.  Neck: Supple, no pain or tenderness  CV: No JVD. No peripheral edema.  Pulmonary: Symmetric Chest rise. Normal respiratory effort.  Abdomen: Soft to touch, non-tender.  Ext: No cyanosis, edema, or deformity  Skin: No rash. Normal palpation of skin.   Musculoskeletal: Normal digits and nails by inspection. No clubbing.   Neurologic Examination  Mental status/Cognition: Alert, oriented to self, place, month  but not to year, good attention.  Speech/language: Fluent, comprehension intact, object naming intact, repetition intact. Cranial nerves:   CN II Pupils equal and reactive to light, left hemianopsia.   CN III,IV,VI  Right gaze deviation, does not cross midline.   CN V normal sensation in V1, V2, and V3 segments bilaterally   CN VII no asymmetry, no nasolabial fold flattening   CN VIII normal hearing to speech    CN IX & X normal palatal elevation, no uvular deviation    CN XI 5/5 head turn and 5/5 shoulder shrug bilaterally    CN XII midline tongue protrusion    Motor:  Muscle bulk: Normal, tone normal, pronator drift noted in left upper extremity.  Mvmt Root Nerve  Muscle Right Left Comments  SA C5/6 Ax Deltoid     EF C5/6 Mc Biceps 5 5   EE C6/7/8 Rad Triceps 5 5   WF C6/7 Med FCR     WE C7/8 PIN ECU     F Ab C8/T1 U ADM/FDI 5 5   HF L1/2/3 Fem Illopsoas 5 5   KE L2/3/4 Fem Quad     DF L4/5 D Peron Tib Ant 5 5   PF S1/2 Tibial Grc/Sol 5 5    Sensation:  Light touch Sensory extinction noted in left upper extremity and left lower extremity.   Pin prick    Temperature    Vibration   Proprioception    Coordination/Complex Motor:  - Finger to Nose with ataxia in left upper extremity - Heel to shin unable to do. - Rapid alternating movement are slowed on the left. - Gait: Deferred for patient safety and given the acuity of the situation.  Labs   CBC:  Recent Labs  Lab 05/24/24 1056 05/24/24 1058  WBC 6.1  --   NEUTROABS 2.9  --   HGB 13.2 13.6  HCT 40.1 40.0  MCV 91.6  --   PLT 294  --    Basic Metabolic Panel:  Lab Results  Component Value Date   NA 143 05/24/2024   K 3.7 05/24/2024   CO2 22 01/27/2024   GLUCOSE 90 05/24/2024   BUN 12 05/24/2024   CREATININE 1.40 (H) 05/24/2024   CALCIUM  8.3 (L) 01/27/2024   GFRNONAA 57 (L) 01/27/2024   Lipid Panel: No results found for: LDLCALC HgbA1c: No results found for: HGBA1C Urine Drug Screen: No results found for: LABOPIA, COCAINSCRNUR, LABBENZ, AMPHETMU, THCU, LABBARB  Alcohol Level No results found for: [facility] INR  Lab Results  Component Value Date   INR 1.0 05/24/2024   APTT  Lab Results  Component  Value Date   APTT 29 05/24/2024     CT Head without contrast(Personally reviewed): CTH was negative for a large hypodensity concerning for a large territory infarct or hyperdensity concerning for an ICH  CT angio Head and Neck with contrast(Personally reviewed): Right PCA P1 occlusion.  MRI Brain(Personally reviewed): Pending  Assessment   TAMIKKA PILGER is a 88 y.o. female with hx of hypothyroidism, macular degeneration, carotid artery disease, depression who was at home with her daughter when she had sudden onset left hemianopsia and right gaze deviation and left-sided sensory extinction.  She was brought in as a code stroke and CT head was negative for ICH.  She was given TNKase after thorough discussion with daughter over the phone.  Slight delay in TNKase administration as daughter wanted to take some time before making a final decision.  There was additional  delay due to blood pressure being above goal for TNK and requiring treatment prior to TNK administration.  CT angio of the head and neck demonstrated right PCA P1 occlusion and patient was taken to thrombectomy after extensive discussion with daughter over phone by Dr. Nancyann Burns with the neuro IR team.   Primary Diagnosis:  Cerebral infarction due to embolism of  right posterior cerebral artery.    Secondary Diagnosis: Essential (primary) hypertension  Recommendations  Acute right PCA stroke with right PCA P1 occlusion status post TNK and going for thrombectomy: - Frequent NeuroChecks for post tNK care per stroke unit protocol: - Initial CTH demonstrated no acute hemorrhage or mass - MRI Brain -pending - CTA -right PCA P1 occlusion - CT Perfusion -was not obtained as she was within 6-hour at arrival from last seen normal. - TTE -pending - Lipid Panel: LDL -pending  - Statin: Not on statin but will continue home Zetia . - HbA1c: Pending - Antithrombotic: Start ASA 81 mg daily if 24 h CTH does not show acute  hemorrhage - DVT prophylaxis: SCDs. Pharmacologic prophylaxis if 24 h CTH does not demonstrate acute hemorrhage - Systolic Blood Pressure goal: Per neuro IR for the first 24 hours after intervention. - Telemetry monitoring for arrhythmia: 72 hours - Swallow screen - ordered - PT/OT/SLP consults  Hypothyroidism: Continue home Synthroid .  GERD: Continue home PPI.  ______________________________________________________________________  Allergies verified with daughter and patient has no known allergies.  CODE STATUS verified with daughter over phone and patient is DNR and DNI.   This patient is critically ill and at significant risk of neurological worsening, death and care requires constant monitoring of vital signs, hemodynamics,respiratory and cardiac monitoring, neurological assessment, discussion with family, other specialists and medical decision making of high complexity. I spent 70 minutes of neurocritical care time  in the care of  this patient. This was time spent independent of any time provided by nurse practitioner or PA.  Taijuan Serviss [facility] 05/24/2024  12:04 PM   Signed, Braxley Balandran, MD [facility]  Risks, benefits and alternatives of IVT discussed with patient and/or family and they agreed [facility] personally reviewed prior to TNK administration

## 2024-05-24 NOTE — Progress Notes (Signed)
 PHARMACIST CODE STROKE RESPONSE  Notified to mix TNK at 11:10 by Dr. Vanessa  TNK preparation completed at 11:13  TNK dose = 19 mg IV over 5 seconds  Issues/delays encountered (if applicable): consent, BP management   Powell Blush, PharmD, BCCCP  05/24/24 11:16 AM

## 2024-05-24 NOTE — ED Triage Notes (Signed)
 Pt BIB [facility] from home for code stroke. Pt LKW 0950, sx onset 1000 with family noticing L side lean and R side gaze. Pt hypertensive per EMS with all other VSS.

## 2024-05-24 NOTE — Code Documentation (Signed)
 Stroke Response Nurse Documentation Code Documentation  Donna Petty is a 88 y.o. female arriving to [facility]  via [facility] on 10/21 with past medical hx of hypothyroidism, macular degeneration, carotid artery disease, depression. On No antithrombotic. Code stroke was activated by EMS.   Patient from home where she was LKW at 0930 and now complaining of L sided weakness and R gaze.    Stroke team at the bedside on patient arrival. Labs drawn and patient cleared for CT by EDP. Patient to CT with team. NIHSS 7, see documentation for details and code stroke times. Patient with right gaze preference , left hemianopia, left limb ataxia, and Sensory  neglect on exam. The following imaging was completed:  CT Head and CTA. Patient is a candidate for IV Thrombolytic due to acute stroke with LKW within 4.5 hours. Patient is a candidate for IR due to R P1 occlusion.   Care Plan: TNK given 1113, IR for emergent thrombectomy.   Bedside handoff with IR RN Fay.    Lauraine LITTIE Searle  Stroke Response RN

## 2024-05-25 ENCOUNTER — Inpatient Hospital Stay (HOSPITAL_COMMUNITY)

## 2024-05-25 DIAGNOSIS — I739 Peripheral vascular disease, unspecified: Secondary | ICD-10-CM

## 2024-05-25 DIAGNOSIS — Z7902 Long term (current) use of antithrombotics/antiplatelets: Secondary | ICD-10-CM

## 2024-05-25 DIAGNOSIS — I63431 Cerebral infarction due to embolism of right posterior cerebral artery: Secondary | ICD-10-CM | POA: Diagnosis not present

## 2024-05-25 DIAGNOSIS — I6523 Occlusion and stenosis of bilateral carotid arteries: Secondary | ICD-10-CM | POA: Diagnosis not present

## 2024-05-25 DIAGNOSIS — I7090 Unspecified atherosclerosis: Secondary | ICD-10-CM

## 2024-05-25 DIAGNOSIS — R29708 NIHSS score 8: Secondary | ICD-10-CM

## 2024-05-25 DIAGNOSIS — I69391 Dysphagia following cerebral infarction: Secondary | ICD-10-CM

## 2024-05-25 DIAGNOSIS — I6782 Cerebral ischemia: Secondary | ICD-10-CM | POA: Diagnosis not present

## 2024-05-25 DIAGNOSIS — I6389 Other cerebral infarction: Secondary | ICD-10-CM | POA: Diagnosis not present

## 2024-05-25 DIAGNOSIS — I672 Cerebral atherosclerosis: Secondary | ICD-10-CM | POA: Diagnosis not present

## 2024-05-25 DIAGNOSIS — R519 Headache, unspecified: Secondary | ICD-10-CM | POA: Diagnosis not present

## 2024-05-25 DIAGNOSIS — I639 Cerebral infarction, unspecified: Secondary | ICD-10-CM | POA: Diagnosis not present

## 2024-05-25 DIAGNOSIS — G936 Cerebral edema: Secondary | ICD-10-CM | POA: Diagnosis not present

## 2024-05-25 DIAGNOSIS — I6502 Occlusion and stenosis of left vertebral artery: Secondary | ICD-10-CM | POA: Diagnosis not present

## 2024-05-25 DIAGNOSIS — Z7982 Long term (current) use of aspirin: Secondary | ICD-10-CM

## 2024-05-25 LAB — ECHOCARDIOGRAM COMPLETE
Area-P 1/2: 3.74 cm2
Calc EF: 63.5 %
S' Lateral: 2.5 cm
Single Plane A2C EF: 64.3 %
Single Plane A4C EF: 58.1 %
Weight: 2730.18 [oz_av]

## 2024-05-25 LAB — LIPID PANEL
Cholesterol: 168 mg/dL (ref 0–200)
HDL: 40 mg/dL — ABNORMAL LOW (ref >40)
LDL Cholesterol: 95 mg/dL (ref 0–99)
Total CHOL/HDL Ratio: 4.2 ratio
Triglycerides: 167 mg/dL — ABNORMAL HIGH (ref <150)
VLDL: 33 mg/dL (ref 0–40)

## 2024-05-25 MED ORDER — MIRTAZAPINE 15 MG PO TABS
15.0000 mg | ORAL_TABLET | Freq: Every day | ORAL | Status: DC
  Administered 2024-05-25 – 2024-06-01 (×8): 15 mg via ORAL
  Filled 2024-05-25 (×8): qty 1

## 2024-05-25 MED ORDER — LABETALOL HCL 5 MG/ML IV SOLN
20.0000 mg | INTRAVENOUS | Status: DC | PRN
  Administered 2024-05-25: 20 mg via INTRAVENOUS
  Filled 2024-05-25: qty 4

## 2024-05-25 MED ORDER — METHOCARBAMOL 500 MG PO TABS
500.0000 mg | ORAL_TABLET | Freq: Three times a day (TID) | ORAL | Status: DC | PRN
  Administered 2024-05-27 – 2024-05-31 (×5): 500 mg via ORAL
  Filled 2024-05-25 (×6): qty 1

## 2024-05-25 MED ORDER — IOHEXOL 350 MG/ML SOLN
75.0000 mL | Freq: Once | INTRAVENOUS | Status: AC | PRN
  Administered 2024-05-25: 75 mL via INTRAVENOUS

## 2024-05-25 MED ORDER — LEVOTHYROXINE SODIUM 100 MCG PO TABS
100.0000 ug | ORAL_TABLET | Freq: Every day | ORAL | Status: DC
  Administered 2024-05-25 – 2024-06-02 (×9): 100 ug via ORAL
  Filled 2024-05-25 (×9): qty 1

## 2024-05-25 MED ORDER — ONDANSETRON HCL 4 MG/2ML IJ SOLN
INTRAMUSCULAR | Status: AC
  Filled 2024-05-25: qty 2

## 2024-05-25 MED ORDER — ASPIRIN 81 MG PO CHEW
81.0000 mg | CHEWABLE_TABLET | Freq: Every day | ORAL | Status: DC
  Administered 2024-05-25 – 2024-05-29 (×5): 81 mg via ORAL
  Filled 2024-05-25 (×5): qty 1

## 2024-05-25 MED ORDER — PANTOPRAZOLE SODIUM 40 MG PO TBEC
40.0000 mg | DELAYED_RELEASE_TABLET | Freq: Every day | ORAL | Status: DC
  Administered 2024-05-25 – 2024-06-01 (×8): 40 mg via ORAL
  Filled 2024-05-25 (×8): qty 1

## 2024-05-25 MED ORDER — POLYETHYLENE GLYCOL 3350 17 G PO PACK
17.0000 g | PACK | Freq: Two times a day (BID) | ORAL | Status: DC
  Administered 2024-05-25 – 2024-06-02 (×13): 17 g via ORAL
  Filled 2024-05-25 (×13): qty 1

## 2024-05-25 MED ORDER — AMLODIPINE BESYLATE 5 MG PO TABS
5.0000 mg | ORAL_TABLET | Freq: Every day | ORAL | Status: DC
  Administered 2024-05-25 – 2024-06-02 (×9): 5 mg via ORAL
  Filled 2024-05-25 (×9): qty 1

## 2024-05-25 MED ORDER — ATORVASTATIN CALCIUM 40 MG PO TABS
40.0000 mg | ORAL_TABLET | Freq: Every day | ORAL | Status: DC
  Administered 2024-05-25 – 2024-05-27 (×3): 40 mg via ORAL
  Filled 2024-05-25 (×3): qty 1

## 2024-05-25 MED ORDER — POTASSIUM CHLORIDE CRYS ER 20 MEQ PO TBCR
40.0000 meq | EXTENDED_RELEASE_TABLET | Freq: Once | ORAL | Status: AC
  Administered 2024-05-25: 40 meq via ORAL
  Filled 2024-05-25: qty 2

## 2024-05-25 MED ORDER — HEPARIN SODIUM (PORCINE) 5000 UNIT/ML IJ SOLN
5000.0000 [IU] | Freq: Three times a day (TID) | INTRAMUSCULAR | Status: DC
  Administered 2024-05-25 – 2024-05-29 (×13): 5000 [IU] via SUBCUTANEOUS
  Filled 2024-05-25 (×13): qty 1

## 2024-05-25 MED ORDER — MEMANTINE HCL 10 MG PO TABS
5.0000 mg | ORAL_TABLET | Freq: Two times a day (BID) | ORAL | Status: DC
Start: 2024-05-25 — End: 2024-06-02
  Administered 2024-05-25 – 2024-06-02 (×17): 5 mg via ORAL
  Filled 2024-05-25 (×18): qty 1

## 2024-05-25 MED ORDER — CLOPIDOGREL BISULFATE 75 MG PO TABS
75.0000 mg | ORAL_TABLET | Freq: Every day | ORAL | Status: DC
  Administered 2024-05-25 – 2024-05-29 (×5): 75 mg via ORAL
  Filled 2024-05-25 (×5): qty 1

## 2024-05-25 NOTE — Progress Notes (Signed)
 This RN requested PRN BP management from Dr. Rosemarie to take Pt off Cleviprex drip, BP within parameters at this moment. Provider will address on rounds.

## 2024-05-25 NOTE — Progress Notes (Signed)
  Echocardiogram 2D Echocardiogram has been performed.  Donna Petty 05/25/2024, 1:41 PM

## 2024-05-25 NOTE — Progress Notes (Signed)
 Speech Language Pathology Treatment: Dysphagia  Patient Details Name: Donna Petty MRN: [id number] DOB: May 16, 1936 Today's Date: 05/25/2024 Time: 8564-8544 SLP Time Calculation (min) (ACUTE ONLY): 20 min  Assessment / Plan / Recommendation Clinical Impression  SLP returned to reassess swallow and determine if PO diet could be started. Patient was alert, awake, and performing oral care with RN upon SLP arrival. Patient's daughter, Donna Petty, was at bedside. Patient had straw sips of ginger ale without any s/s of aspiration. She fed herself chocolate pudding and no s/s of aspiration were observed. Patient required cues to see pudding cup as she has a strong right gaze preference. Recommending puree diet with thin liquids. SLP will continue to follow.   HPI HPI: Patient is an 88 y.o. female with PMH: dementia, macular degeneration, hypothyroidism, CAD, depression. She lives with her daughter at baseline. She presented to the hospital on 05/24/24 after sudden onset left sided symptoms, leaning to the left and had right gaze deviation. In ED, she was hypertensive with left hemianopsia with left sided extinction. CT negative for large territory infarct or hemorrhage. MRI brain showed large left right PCA infarct, additional scattered small acute bilateral cerebral and cerebellar infarcts, moderate cerebral atrophy. TNK administered but unable to do thrombectomy.      SLP Plan  Continue with current plan of care  Patient needs continued Speech Language Pathology Services       Recommendations  Diet recommendations: Dysphagia 1 (puree);Thin liquid Liquids provided via: Straw;Cup Medication Administration: Other (Comment) (whole or crushed in puree) Supervision: Full supervision/cueing for compensatory strategies;Staff to assist with self feeding Compensations: Slow rate;Small sips/bites Postural Changes and/or Swallow Maneuvers: Seated upright 90 degrees;Upright 30-60 min after meal                   Oral care BID;Staff/trained caregiver to provide oral care     Dysphagia, unspecified (R13.10)     Continue with current plan of care    Damien Hy  Graduate SLP Clinican

## 2024-05-25 NOTE — Progress Notes (Signed)
   Inpatient Rehab Admissions Coordinator :  Per therapy recommendations patient was screened for CIR candidacy by Ottie Glazier RN MSN. Patient is not yet at a level to tolerate the intensity required to pursue a CIR admit . The CIR admissions team will follow and monitor for progress and place a Rehab Consult order if felt to be appropriate. Please contact me with any questions.  Ottie Glazier RN MSN Admissions Coordinator [phone]

## 2024-05-25 NOTE — Progress Notes (Addendum)
 STROKE TEAM PROGRESS NOTE    SIGNIFICANT HOSPITAL EVENTS 10/21: Patient admitted with left-sided weakness and right gaze deviation, mechanical thrombectomy attempted for right PCA P1 occlusion but unsuccessful due to extreme vertebral artery tortuosity  INTERIM HISTORY/SUBJECTIVE Patient remains hemodynamically stable and afebrile overnight.  MRI reveals large acute right PCA infarct and a conditional scattered small bilateral acute cerebral and cerebellar infarcts Patient is awake and interactive but has fixed right gaze deviation and suspect left-sided field cut because she has poor vision at baseline due to macular degeneration.  She also has mild left hemiparesis. No family at the bedside  OBJECTIVE  CBC    Component Value Date/Time   WBC 6.1 05/24/2024 1056   RBC 4.38 05/24/2024 1056   HGB 13.6 05/24/2024 1058   HCT 40.0 05/24/2024 1058   PLT 294 05/24/2024 1056   MCV 91.6 05/24/2024 1056   MCH 30.1 05/24/2024 1056   MCHC 32.9 05/24/2024 1056   RDW 14.8 05/24/2024 1056   LYMPHSABS 2.2 05/24/2024 1056   MONOABS 0.7 05/24/2024 1056   EOSABS 0.2 05/24/2024 1056   BASOSABS 0.0 05/24/2024 1056    BMET    Component Value Date/Time   NA 143 05/24/2024 1058   K 3.7 05/24/2024 1058   CL 105 05/24/2024 1058   CO2 27 05/24/2024 1056   GLUCOSE 90 05/24/2024 1058   BUN 12 05/24/2024 1058   CREATININE 1.40 (H) 05/24/2024 1058   CALCIUM  9.0 05/24/2024 1056   GFRNONAA 41 (L) 05/24/2024 1056    IMAGING past 24 hours MR BRAIN WO CONTRAST Result Date: 05/25/2024 EXAM: MRI BRAIN WITHOUT CONTRAST 05/25/2024 11:22:00 AM TECHNIQUE: Multiplanar multisequence MRI of the head/brain was performed without the administration of intravenous contrast. COMPARISON: CTA head and neck 05/25/2024. CLINICAL HISTORY: Acute neuro deficit, stroke suspected; 24 hours post tnkase. FINDINGS: LIMITATIONS: The examination is mildly to moderately motion degraded. BRAIN AND VENTRICLES: There is a large acute  right PCA infarct involving the right temporal and occipital lobes as well as right thalamus with cytotoxic edema but no significant associated mass effect or hemorrhage. Additional scattered small acute infarcts are present in both cerebellar hemispheres, left temporal and occipital lobes, both parietal lobes, and posterior right frontal lobe as well as along the right caudate body. There are small chronic infarcts involving the right basal ganglia and both cerebellar hemispheres. Small chronic hemorrhages are present in the cerebellum and midbrain. Mild T2 hyperintensities elsewhere in the cerebral white matter bilaterally are nonspecific but compatible with chronic small vessel ischemic disease. There is moderate cerebral atrophy. No mass, midline shift, hydrocephalus, or extra axial fluid collection is identified. Abnormal signal is noted along the course of the proximal right PCA corresponding to the occlusion shown on CTA. ORBITS: Bilateral cataract extraction. SINUSES AND MASTOIDS: Small right mastoid effusion. Clear paranasal sinuses. BONES AND SOFT TISSUES: Normal marrow signal. No acute soft tissue abnormality. IMPRESSION: 1. Large acute right PCA infarct. 2. Additional scattered small acute bilateral cerebral and cerebellar infarcts. 3. Moderate cerebral atrophy and mild chronic small vessel ischemic disease with chronic lacunar infarcts as above. Electronically signed by: Dasie Hamburg MD 05/25/2024 11:43 AM EDT RP Workstation: [id number]   IR PERCUTANEOUS ART THROMBECTOMY/INFUSION INTRACRANIAL INC DIAG ANGIO Result Date: 05/25/2024 INDICATION: Acute Large Vessel occlusion, right posterior cerebral artery EXAM: Mechanical thrombectomy, attempted CONSENT: Consent was obtained from the patient's daughter MEDICATIONS: No additional ANESTHESIA/SEDATION: General CONTRAST:  42mL OMNIPAQUE IOHEXOL 300 MG/ML  SOLN FLUOROSCOPY: Radiation Exposure Index (as provided by the fluoroscopic device):  985 MGy reference  air Kerma COMPLICATIONS: None immediate. TECHNIQUE: Maximal Sterile Barrier Technique was utilized including caps, mask, sterile gowns, sterile gloves, sterile drape, hand hygiene and skin antiseptic. A timeout was performed prior to the initiation of the procedure. PROCEDURE: Femoral access was obtained with ultrasound using direct visualization of the needle puncture into the artery. A 80 cm neuron max sheath was placed. A penumbra select Berenstein catheter was then used to catheterize the left vertebral artery for angiography. This demonstrated an extremely tortuous left vertebral artery. The intracranial images demonstrated occlusion of the right posterior cerebral artery. The left was patent. The basilar artery was normal. The neuron max was advanced over the penumbra select Berenstein into the origin of the left vertebral artery. An attempt was made to pass a catalyst 6 aspiration catheter over a Trevo Trak microcatheter and synchro support wire, which was unsuccessful due to tortuosity of the vertebral artery. The neuron max catheter was exchanged over a Bentson wire and a 55 cm 8 French Terumo sheath was placed for more support. An attempt was made to advance a zoom support catheter into the vertebral artery which was not successful. The neuron max was then replaced and advanced into the origin of the vertebral artery. It was then possible to advance a Vecta 46 catheter over the TREVO TRAK microcatheter and synchro support wire about midway up the vertebral artery, but no further. An Amplatz wire was then advanced through the Vecta 46 and a penumbra select Esaw was advanced over the wire which permitted advancement of the neuron max about a 3rd of the way up the vertebral artery. It was then possible to advance the Vecta 46 catheter over the TREVO TRAK microcatheter into the lower basilar artery. However, the catheter could not be advanced into the right posterior cerebral artery due to the extreme  proximal tortuosity. The left vertebral artery approach was abandoned. A Simmons 2 catheter was used for catheterization of the right subclavian artery and right vertebral origin to visualize the right vertebral artery. This also had extreme tortuosity and the decision was made not to attempt catheterization as it was not likely to be successful. Hemostasis was obtained with Angio-Seal. FINDINGS: Left vertebral: The left vertebral artery is extremely tortuous. It is otherwise normal. The intracranial images demonstrate a normal basilar artery and normal left posterior cerebral artery. There is a proximal occlusion of the right posterior cerebral artery. Right vertebral: The right vertebral artery is extremely tortuous. Otherwise normal. Left vertebral after attempted thrombectomy: There is no dissection or other complication. The intracranial images are unchanged. Right posterior cerebral artery occlusion but no other occlusion. IMPRESSION: Right posterior cerebral artery embolus Embolectomy unsuccessful due to extreme proximal tortuosity of the vertebral arteries. Codes: 62784, [id number], [id number] Electronically Signed   By: Nancyann Burns M.D.   On: 05/25/2024 08:51   IR US  Guide Vasc Access Right Result Date: 05/25/2024 INDICATION: Acute Large Vessel occlusion, right posterior cerebral artery EXAM: Mechanical thrombectomy, attempted CONSENT: Consent was obtained from the patient's daughter MEDICATIONS: No additional ANESTHESIA/SEDATION: General CONTRAST:  42mL OMNIPAQUE IOHEXOL 300 MG/ML  SOLN FLUOROSCOPY: Radiation Exposure Index (as provided by the fluoroscopic device): 985 MGy reference air Kerma COMPLICATIONS: None immediate. TECHNIQUE: Maximal Sterile Barrier Technique was utilized including caps, mask, sterile gowns, sterile gloves, sterile drape, hand hygiene and skin antiseptic. A timeout was performed prior to the initiation of the procedure. PROCEDURE: Femoral access was obtained with ultrasound using direct  visualization of the needle puncture into the  artery. A 80 cm neuron max sheath was placed. A penumbra select Berenstein catheter was then used to catheterize the left vertebral artery for angiography. This demonstrated an extremely tortuous left vertebral artery. The intracranial images demonstrated occlusion of the right posterior cerebral artery. The left was patent. The basilar artery was normal. The neuron max was advanced over the penumbra select Berenstein into the origin of the left vertebral artery. An attempt was made to pass a catalyst 6 aspiration catheter over a Trevo Trak microcatheter and synchro support wire, which was unsuccessful due to tortuosity of the vertebral artery. The neuron max catheter was exchanged over a Bentson wire and a 55 cm 8 French Terumo sheath was placed for more support. An attempt was made to advance a zoom support catheter into the vertebral artery which was not successful. The neuron max was then replaced and advanced into the origin of the vertebral artery. It was then possible to advance a Vecta 46 catheter over the TREVO TRAK microcatheter and synchro support wire about midway up the vertebral artery, but no further. An Amplatz wire was then advanced through the Vecta 46 and a penumbra select Esaw was advanced over the wire which permitted advancement of the neuron max about a 3rd of the way up the vertebral artery. It was then possible to advance the Vecta 46 catheter over the TREVO TRAK microcatheter into the lower basilar artery. However, the catheter could not be advanced into the right posterior cerebral artery due to the extreme proximal tortuosity. The left vertebral artery approach was abandoned. A Simmons 2 catheter was used for catheterization of the right subclavian artery and right vertebral origin to visualize the right vertebral artery. This also had extreme tortuosity and the decision was made not to attempt catheterization as it was not likely to be  successful. Hemostasis was obtained with Angio-Seal. FINDINGS: Left vertebral: The left vertebral artery is extremely tortuous. It is otherwise normal. The intracranial images demonstrate a normal basilar artery and normal left posterior cerebral artery. There is a proximal occlusion of the right posterior cerebral artery. Right vertebral: The right vertebral artery is extremely tortuous. Otherwise normal. Left vertebral after attempted thrombectomy: There is no dissection or other complication. The intracranial images are unchanged. Right posterior cerebral artery occlusion but no other occlusion. IMPRESSION: Right posterior cerebral artery embolus Embolectomy unsuccessful due to extreme proximal tortuosity of the vertebral arteries. Codes: 62784, [id number], [id number] Electronically Signed   By: Nancyann Burns M.D.   On: 05/25/2024 08:51   IR ANGIO VERTEBRAL SEL SUBCLAVIAN INNOMINATE UNI R MOD SED Result Date: 05/25/2024 INDICATION: Acute Large Vessel occlusion, right posterior cerebral artery EXAM: Mechanical thrombectomy, attempted CONSENT: Consent was obtained from the patient's daughter MEDICATIONS: No additional ANESTHESIA/SEDATION: General CONTRAST:  42mL OMNIPAQUE IOHEXOL 300 MG/ML  SOLN FLUOROSCOPY: Radiation Exposure Index (as provided by the fluoroscopic device): 985 MGy reference air Kerma COMPLICATIONS: None immediate. TECHNIQUE: Maximal Sterile Barrier Technique was utilized including caps, mask, sterile gowns, sterile gloves, sterile drape, hand hygiene and skin antiseptic. A timeout was performed prior to the initiation of the procedure. PROCEDURE: Femoral access was obtained with ultrasound using direct visualization of the needle puncture into the artery. A 80 cm neuron max sheath was placed. A penumbra select Berenstein catheter was then used to catheterize the left vertebral artery for angiography. This demonstrated an extremely tortuous left vertebral artery. The intracranial images demonstrated  occlusion of the right posterior cerebral artery. The left was patent. The basilar artery was normal.  The neuron max was advanced over the penumbra select Berenstein into the origin of the left vertebral artery. An attempt was made to pass a catalyst 6 aspiration catheter over a Trevo Trak microcatheter and synchro support wire, which was unsuccessful due to tortuosity of the vertebral artery. The neuron max catheter was exchanged over a Bentson wire and a 55 cm 8 French Terumo sheath was placed for more support. An attempt was made to advance a zoom support catheter into the vertebral artery which was not successful. The neuron max was then replaced and advanced into the origin of the vertebral artery. It was then possible to advance a Vecta 46 catheter over the TREVO TRAK microcatheter and synchro support wire about midway up the vertebral artery, but no further. An Amplatz wire was then advanced through the Vecta 46 and a penumbra select Esaw was advanced over the wire which permitted advancement of the neuron max about a 3rd of the way up the vertebral artery. It was then possible to advance the Vecta 46 catheter over the TREVO TRAK microcatheter into the lower basilar artery. However, the catheter could not be advanced into the right posterior cerebral artery due to the extreme proximal tortuosity. The left vertebral artery approach was abandoned. A Simmons 2 catheter was used for catheterization of the right subclavian artery and right vertebral origin to visualize the right vertebral artery. This also had extreme tortuosity and the decision was made not to attempt catheterization as it was not likely to be successful. Hemostasis was obtained with Angio-Seal. FINDINGS: Left vertebral: The left vertebral artery is extremely tortuous. It is otherwise normal. The intracranial images demonstrate a normal basilar artery and normal left posterior cerebral artery. There is a proximal occlusion of the right  posterior cerebral artery. Right vertebral: The right vertebral artery is extremely tortuous. Otherwise normal. Left vertebral after attempted thrombectomy: There is no dissection or other complication. The intracranial images are unchanged. Right posterior cerebral artery occlusion but no other occlusion. IMPRESSION: Right posterior cerebral artery embolus Embolectomy unsuccessful due to extreme proximal tortuosity of the vertebral arteries. Codes: 62784, [id number], [id number] Electronically Signed   By: Nancyann Burns M.D.   On: 05/25/2024 08:51   CT ANGIO HEAD NECK W WO CM Result Date: 05/25/2024 EXAM: CTA Head and Neck with Intravenous Contrast. CT Head without Contrast. CLINICAL HISTORY: 88 year old female with headache, neuro deficit, left pupil change, and profuse vomiting. Status post code stroke presentation yesterday with right PCA occlusion. TECHNIQUE: Axial CTA images of the head and neck performed with 75 mL of iohexol (OMNIPAQUE) 350 MG/ML injection. MIP reconstructed images were created and reviewed. Axial computed tomography images of the head/brain performed without intravenous contrast. Note: Per PQRS, the description of internal carotid artery percent stenosis, including 0 percent or normal exam, is based on [place] [employer] Symptomatic Carotid Endarterectomy Trial (NASCET) criteria. Dose reduction technique was used including one or more of the following: automated exposure control, adjustment of mA and kV according to patient size, and/or iterative reconstruction. CONTRAST: With 75 mL of iohexol (OMNIPAQUE) 350 MG/ML injection. COMPARISON: CT head and CTA head and neck 05/24/2024. FINDINGS: CT HEAD: BRAIN: Fairly subtle asymmetric hypodensity now in the right PCA territory such as on coronal image 23 of series 8. This is compatible with cytotoxic edema. No significant intracranial mass effect. No hemorrhagic transformation. Elsewhere stable noncontrast CT appearance of the brain including chronic right  basal ganglia lacunar infarct. No acute intraparenchymal hemorrhage. No mass lesion. No midline shift or  extra-axial collection. VENTRICLES: No hydrocephalus. ORBITS: The orbits are unremarkable. SINUSES AND MASTOIDS: The paranasal sinuses and mastoid air cells are clear. CTA NECK: COMMON CAROTID ARTERIES: No significant stenosis. No dissection or occlusion. INTERNAL CAROTID ARTERIES: Stable right carotid bifurcation atherosclerosis resulting in estimated 60% right ICA origin stenosis. Stable left carotid bifurcation atherosclerosis without stenosis. 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 No dissection or occlusion. VERTEBRAL ARTERIES: Stable left subclavian artery and left vertebral artery origin atherosclerosis with mild stenosis of the left vertebral artery origin suspected and stable. Comparatively mild proximal right subclavian artery atherosclerosis. Both cervical vertebral arteries remain patent and highly tortuous to the skull base. No dissection or occlusion. CTA HEAD: ANTERIOR CEREBRAL ARTERIES: No significant stenosis. No occlusion. No aneurysm. MIDDLE CEREBRAL ARTERIES: No significant stenosis. No occlusion. No aneurysm. POSTERIOR CEREBRAL ARTERIES: Stable posterior circulation, unchanged right PCA P1 segment occlusion. No aneurysm. BASILAR ARTERY: No significant stenosis. No occlusion. No aneurysm. INTRACRANIAL INTERNAL CAROTID ARTERIES: Mild ICA siphon atherosclerosis. OTHER: Stable aortic arch atherosclerosis. Major dural venous sinuses are enhancing and appear to be patent. SOFT TISSUES: Visible upper chest stable nonvascular neck soft tissues are stable. No acute finding. No masses or lymphadenopathy. BONES: The osseous structures are stable from the CTA yesterday. IMPRESSION: 1. Early right PCA territory cytotoxic edema without hemorrhagic transformation or mass effect. 2. Stable CTA head and neck since yesterday. Unchanged right PCA P1 segment  occlusion. 60% stenosis right ICA origin. 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 Electronically signed by: Helayne Hurst MD 05/25/2024 05:27 AM EDT RP Workstation: [id number]    Vitals:   05/25/24 1145 05/25/24 1150 05/25/24 1155 05/25/24 1200  BP:    (!) 175/73  Pulse: 75 90 90 81  Resp: (!) 26 (!) 23 16 19   Temp:      TempSrc:      SpO2: 95% (!) 88% 95% 97%  Weight:         PHYSICAL EXAM General: Ill-appearing elderly patient in no acute distress Psych:  Mood and affect appropriate for situation CV: Regular rate and rhythm on monitor Respiratory:  Regular, unlabored respirations on room air   NEURO:  Mental Status: Patient responds to name and is able to state correct age and month Speech/Language: speech is with mild dysarthria but without aphasia  Cranial Nerves:  II: PERRL.  Left hemianopsia III, IV, VI: Right gaze deviation, unable to cross midline V: Sensation is intact to light touch and diminished on the left VII: Face is symmetrical resting and smiling VIII: hearing intact to voice. IX, X: Voice is mildly dysarthric KP:Dynloizm shrug 5/5. XII: tongue is midline without fasciculations. Motor: Able to move all 4 extremities with antigravity strength, but drift noted to the left arm Tone: is normal and bulk is normal Sensation- Intact to light touch on the right but absent in the left arm and diminished in the left leg Coordination: FTN intact on the right Gait- deferred  Most Recent NIH  1a Level of Conscious.: 0 1b LOC Questions: 0 1c LOC Commands: 0 2 Best Gaze: 2 3 Visual: 2 4 Facial Palsy: 0 5a Motor Arm - left: 2 5b Motor Arm - Right: 0 6a Motor Leg - Left: 0 6b Motor Leg - Right: 0 7 Limb Ataxia: 0 8 Sensory: 1 9 Best Language: 0 10 Dysarthria: 1 11 Extinct. and Inatten.: 0 TOTAL: 8   ASSESSMENT/PLAN  [name] is a 88 y.o.  female with history of hypothyroidism, macular degeneration and depression admitted for  left hemianopsia, right gaze deviation and left hemiparesis.  She was found to have a right PCA P1 occlusion, and mechanical thrombectomy was attempted.  Unfortunately, this procedure was unsuccessful due to extreme tortuosity of the vertebral artery.  NIH on Admission 7  Acute Ischemic Infarct: Right PCA territory stroke with multiple small scattered infarcts in bilateral hemispheres Etiology: Large vessel occlusion with possible cardioembolic component Code Stroke CT head No acute abnormality. Small vessel disease. ASPECTS 10.    CTA head & neck right PCA P1 segment occlusion, right ICA origin plaque with 60% stenosis, bilateral cervical ICA tortuosity and possible FMD, moderate left ICA siphon stenosis, mild right M2 branch stenoses MRI large acute right PCA infarct, additional scattered small acute bilateral cerebral and cerebellar infarcts, moderate cerebral atrophy and chronic small vessel ischemic disease 2D Echo pending Will need 30-day cardiac monitor at discharge LDL 95 HgbA1c 5.4 VTE prophylaxis -SCDs No antithrombotic prior to admission, now on aspirin  81 mg daily and clopidogrel 75 mg daily for 12 weeks and then aspirin  alone. Therapy recommendations:  Pending Disposition: Pending  Hypertension Home meds: None Stable Keep systolic blood pressure less than 180  Hyperlipidemia Home meds: None LDL 95, goal < 70 Add atorvastatin 40 mg daily Continue statin at discharge  Dysphagia Patient has post-stroke dysphagia, SLP consulted    Diet   Diet NPO time specified  Consider cortrack if family in agreement Advance diet as tolerated  Other Stroke Risk Factors Obesity, Body mass index is 31.72 kg/m., BMI >/= 30 associated with increased stroke risk, recommend weight loss, diet and exercise as appropriate    Other Active Problems None  Hospital day # 1  Patient seen by NP with MD, MD to edit note as needed. Cortney E Everitt Clint Kill , MSN, AGACNP-BC [facility] See Amion for schedule and pager information 05/25/2024 12:40 PM  I have personally obtained history,examined this patient, reviewed notes, independently viewed imaging studies, participated in medical decision making and plan of care.ROS completed by me personally and pertinent positives fully documented  I have made any additions or clarifications directly to the above note. Agree with note above.  Patient presented with right gaze deviation (due to right PCA occlusion also timeline for thrombolysis and unfortunately attempted mechanical thrombectomy was unsuccessful due to severe tortuosity of the vertebral arteries limiting access.  Continue close neurological observation and strict blood pressure control.  Mobilize out of bed.  Speech therapy for swallow eval.  Aspirin  and Plavix for 3 weeks followed by aspirin  alone and aggressive risk factor modification.  Continue ongoing stroke workup.  No family at the bedside. This patient is critically ill and at significant risk of neurological worsening, death and care requires constant monitoring of vital signs, hemodynamics,respiratory and cardiac monitoring, extensive review of multiple databases, frequent neurological assessment, discussion with family, other specialists and medical decision making of high complexity.I have made any additions or clarifications directly to the above note.This critical care time does not reflect procedure time, or teaching time or supervisory time of PA/NP/Med Resident etc but could involve care discussion time.  I spent 30 minutes of neurocritical care time  in the care of  this patient.     Eather Popp, MD Medical Director [facility] Pager: [phone] 05/25/2024 3:24 PM   To contact Stroke Continuity provider, please refer to [email or web address]. After hours, contact General Neurology

## 2024-05-25 NOTE — Progress Notes (Signed)
 Notified Stroke- Dr. Rosemarie that Pt is in a irregular rhythm/AFIB consistently on the monitor. See new medication orders placed by Stroke team.

## 2024-05-25 NOTE — Anesthesia Postprocedure Evaluation (Signed)
 Anesthesia Post Note  Patient: Donna Petty  Procedure(s) Performed: RADIOLOGY WITH ANESTHESIA     Patient location during evaluation: PACU Anesthesia Type: General Level of consciousness: awake and alert Pain management: pain level controlled Vital Signs Assessment: post-procedure vital signs reviewed and stable Respiratory status: spontaneous breathing, nonlabored ventilation, respiratory function stable and patient connected to nasal cannula oxygen Cardiovascular status: blood pressure returned to baseline and stable Postop Assessment: no apparent nausea or vomiting Anesthetic complications: no   No notable events documented.  Last Vitals:  Vitals:   05/25/24 1155 05/25/24 1200  BP:  (!) 175/73  Pulse: 90 81  Resp: 16 19  Temp:    SpO2: 95% 97%    Last Pain:  Vitals:   05/25/24 1200  TempSrc:   PainSc: 0-No pain                 Lynwood MARLA Cornea

## 2024-05-25 NOTE — Evaluation (Signed)
 Physical Therapy Evaluation Patient Details Name: Donna Petty MRN: [id number] DOB: [birth year] Today's Date: 05/25/2024  History of Present Illness  Donna Petty is a 88 y.o. female who was at home with her daughter when she had sudden onset left-sided symptoms.Pt found to have R PCA occlusion, unable to do thrombectomy but did receive TNK. PMH: hypothyroidism, macular degeneration, carotid artery disease, depression   Clinical Impression  Pt admitted with above. PTA pt lived family, was indep with ADLs, and only used rollator for community distances. Pt now presenting with L inattention, L hemiplegia, R gaze preference, impaired balance, delayed processing, and requires assistx2 for mobility. Pt to benefit from inpatient rehab program > 3 hrs a day to address above deficits and achieve safe level of function for transition home with family. Acute PT to cont to follow.        If plan is discharge home, recommend the following: A lot of help with walking and/or transfers;A little help with bathing/dressing/bathroom;Supervision due to cognitive status;Help with stairs or ramp for entrance;Assist for transportation;Assistance with cooking/housework   Can travel by private vehicle        Equipment Recommendations  (TBD at next venue)  Recommendations for Other Services  Rehab consult    Functional Status Assessment Patient has had a recent decline in their functional status and demonstrates the ability to make significant improvements in function in a reasonable and predictable amount of time.     Precautions / Restrictions Precautions Precautions: Fall Recall of Precautions/Restrictions: Impaired Precaution/Restrictions Comments: L neglect Restrictions Weight Bearing Restrictions Per Provider Order: No      Mobility  Bed Mobility Overal bed mobility: Needs Assistance Bed Mobility: Rolling, Sidelying to Sit, Sit to Supine Rolling: Max assist, +2 for physical  assistance Sidelying to sit: Max assist, +2 for physical assistance   Sit to supine: Max assist, +2 for physical assistance   General bed mobility comments: increased time, max verbal and tactile cues    Transfers Overall transfer level: Needs assistance Equipment used: 2 person hand held assist (face to face transfer with use of bed pad) Transfers: Sit to/from Stand Sit to Stand: Total assist, +2 physical assistance           General transfer comment: pt with noted increased fear, bilat knees blocked, totalA to WB on both LEs however L foot WBing on forefoot only    Ambulation/Gait               General Gait Details: unable  Stairs            Wheelchair Mobility     Tilt Bed    Modified Rankin (Stroke Patients Only) Modified Rankin (Stroke Patients Only) Pre-Morbid Rankin Score: Slight disability Modified Rankin: Severe disability     Balance                                             Pertinent Vitals/Pain      Home Living Family/patient expects to be discharged to:: Private residence Living Arrangements: Children Available Help at Discharge: Family Type of Home: House Home Access: Stairs to enter Entrance Stairs-Rails: Right;Left;Can reach both Entrance Stairs-Number of Steps: 5   Home Layout: One level Home Equipment: Agricultural consultant (2 wheels);Rollator (4 wheels);Cane - single point      Prior Function Prior Level of Function : Independent/Modified Independent  Mobility Comments: rollator for grocery shopping, no AD in the house ADLs Comments: indep with bathing     Extremity/Trunk Assessment   Upper Extremity Assessment Upper Extremity Assessment: Defer to OT evaluation    Lower Extremity Assessment Lower Extremity Assessment: Difficult to assess due to impaired cognition (pt moving both to command however pt moving R LE easier)    Cervical / Trunk Assessment Cervical / Trunk Assessment:  Normal  Communication   Communication Communication: Impaired Factors Affecting Communication: Difficulty expressing self;Reduced clarity of speech    Cognition Arousal: Alert Behavior During Therapy: Flat affect   PT - Cognitive impairments: Initiation, Sequencing, Attention, Problem solving                       PT - Cognition Comments: pt with noted L sided inattention Following commands: Impaired Following commands impaired: Follows one step commands inconsistently (consistent with R UE and LE, needs tactile cues to follow with L UE and LE)     Cueing Cueing Techniques: Verbal cues, Gestural cues, Tactile cues     General Comments General comments (skin integrity, edema, etc.): BP elevated but SBP < 180    Exercises     Assessment/Plan    PT Assessment Patient needs continued PT services  PT Problem List Decreased strength;Decreased range of motion;Decreased activity tolerance;Decreased balance;Decreased mobility;Decreased coordination       PT Treatment Interventions DME instruction;Gait training;Stair training;Functional mobility training;Therapeutic activities;Therapeutic exercise;Balance training    PT Goals (Current goals can be found in the Care Plan section)  Acute Rehab PT Goals Patient Stated Goal: home PT Goal Formulation: With patient Time For Goal Achievement: 06/08/24 Potential to Achieve Goals: Good    Frequency Min 4X/week     Co-evaluation PT/OT/SLP Co-Evaluation/Treatment: Yes Reason for Co-Treatment: Complexity of the patient's impairments (multi-system involvement);For patient/therapist safety PT goals addressed during session: Mobility/safety with mobility         AM-PAC PT 6 Clicks Mobility  Outcome Measure Help needed turning from your back to your side while in a flat bed without using bedrails?: Total Help needed moving from lying on your back to sitting on the side of a flat bed without using bedrails?: Total Help  needed moving to and from a bed to a chair (including a wheelchair)?: Total Help needed standing up from a chair using your arms (e.g., wheelchair or bedside chair)?: Total Help needed to walk in hospital room?: Total Help needed climbing 3-5 steps with a railing? : Total 6 Click Score: 6    End of Session   Activity Tolerance: Patient limited by fatigue Patient left: in bed;with call bell/phone within reach;with bed alarm set;with nursing/sitter in room Nurse Communication: Mobility status PT Visit Diagnosis: Unsteadiness on feet (R26.81);Muscle weakness (generalized) (M62.81);Difficulty in walking, not elsewhere classified (R26.2)    Time: 8849-8786 PT Time Calculation (min) (ACUTE ONLY): 23 min   Charges:   PT Evaluation $PT Eval Moderate Complexity: 1 Mod   PT General Charges $$ ACUTE PT VISIT: 1 Visit         Norene Ames, PT, DPT Acute Rehabilitation Services Secure chat preferred Office #: [phone]   Norene CHRISTELLA Ames 05/25/2024, 2:43 PM

## 2024-05-25 NOTE — Evaluation (Signed)
 Clinical/Bedside Swallow Evaluation Patient Details  Name: Donna Petty MRN: [id number] Date of Birth: 24-Oct-1935  Today's Date: 05/25/2024 Time: SLP Start Time (ACUTE ONLY): 1210 SLP Stop Time (ACUTE ONLY): 1232 SLP Time Calculation (min) (ACUTE ONLY): 22 min  Past Medical History:  Past Medical History:  Diagnosis Date   Acid reflux    Arthritis    Carotid artery disease    50-70% left internal carotid artery stenosis   Depression    Dyspnea    Hypothyroidism    Macular degeneration    Thyroid  condition    Visual changes    Past Surgical History:  Past Surgical History:  Procedure Laterality Date   CATARACT EXTRACTION, BILATERAL     CHOLECYSTECTOMY     COLONOSCOPY     ESOPHAGOGASTRODUODENOSCOPY ENDOSCOPY     IR ANGIO VERTEBRAL SEL SUBCLAVIAN INNOMINATE UNI R MOD SED  05/24/2024   IR PERCUTANEOUS ART THROMBECTOMY/INFUSION INTRACRANIAL INC DIAG ANGIO  05/24/2024   IR US  GUIDE VASC ACCESS RIGHT  05/24/2024   RADIOLOGY WITH ANESTHESIA N/A 05/24/2024   Procedure: RADIOLOGY WITH ANESTHESIA;  Surgeon: Radiologist, Medication, MD;  Location: [facility];  Service: Radiology;  Laterality: N/A;   TOTAL HIP ARTHROPLASTY Left 01/26/2024   Procedure: ARTHROPLASTY, HIP, TOTAL, ANTERIOR APPROACH;  Surgeon: Ernie Cough, MD;  Location: [facility];  Service: Orthopedics;  Laterality: Left;   TOTAL KNEE ARTHROPLASTY Left 06/26/2020   Procedure: TOTAL KNEE ARTHROPLASTY;  Surgeon: Ernie Cough, MD;  Location: [facility];  Service: Orthopedics;  Laterality: Left;  70 mins   HPI:  Patient is an 88 y.o. female with PMH: dementia, macular degeneration, hypothyroidism, CAD, depression. She lives with her daughter at baseline. She presented to the hospital on 05/24/24 after sudden onset left sided symptoms, leaning to the left and had right gaze deviation. In ED, she was hypertensive with left hemianopsia with left sided extinction. CT negative for large territory infarct or hemorrhage. MRI brain showed large  left right PCA infarct, additional scattered small acute bilateral cerebral and cerebellar infarcts, moderate cerebral atrophy. TNK administered but unable to do thrombectomy.    Assessment / Plan / Recommendation  Clinical Impression  Patient presents with clinical s/s of dysphagia as per this BSE. In addition, she has h/o GERD and Barrett's esophagus and per nursing, had an episode of projectile vomiting after sips of soup on the evening of admission and was made NPO. No overt s/s aspiration with cup sips of thin liquids and swallow initiation appeared timely. When taking very small bites of puree, patient exhibited multiple (4-5) swallows. Her alertness fluctuated and SLP recommended continue NPO except for necessary medications. SLP will follow for PO readiness. SLP Visit Diagnosis: Dysphagia, unspecified (R13.10)    Aspiration Risk  Mild aspiration risk;Risk for inadequate nutrition/hydration    Diet Recommendation NPO;NPO except meds    Medication Administration: Other (Comment) (whole or crushed in puree) Supervision: Full supervision/cueing for compensatory strategies Compensations: Slow rate;Small sips/bites Postural Changes: Seated upright at 90 degrees    Other  Recommendations Oral Care Recommendations: Oral care BID;Staff/trained caregiver to provide oral care     Assistance Recommended at Discharge    Functional Status Assessment Patient has had a recent decline in their functional status and demonstrates the ability to make significant improvements in function in a reasonable and predictable amount of time.  Frequency and Duration min 2x/week  2 weeks       Prognosis Prognosis for improved oropharyngeal function: Good Barriers to Reach Goals: Cognitive deficits  Swallow Study   General Date of Onset: 05/24/24 HPI: Patient is an 88 y.o. female with PMH: dementia, macular degeneration, hypothyroidism, CAD, depression. She lives with her daughter at baseline. She  presented to the hospital on 05/24/24 after sudden onset left sided symptoms, leaning to the left and had right gaze deviation. In ED, she was hypertensive with left hemianopsia with left sided extinction. CT negative for large territory infarct or hemorrhage. MRI brain showed large left right PCA infarct, additional scattered small acute bilateral cerebral and cerebellar infarcts, moderate cerebral atrophy. TNK administered but unable to do thrombectomy. Type of Study: Bedside Swallow Evaluation Previous Swallow Assessment: none found Diet Prior to this Study: NPO Temperature Spikes Noted: No Respiratory Status: Nasal cannula History of Recent Intubation: Yes Total duration of intubation (days):  (intubated for procedure, extubated in OR) Behavior/Cognition: Alert;Lethargic/Drowsy;Requires cueing Oral Cavity Assessment: Within Functional Limits Oral Care Completed by SLP: Yes Oral Cavity - Dentition: Dentures, top;Dentures, bottom Vision: Impaired for self-feeding Self-Feeding Abilities: Needs assist;Needs set up Patient Positioning: Upright in bed Baseline Vocal Quality: Low vocal intensity Volitional Cough: Cognitively unable to elicit Volitional Swallow: Unable to elicit    Oral/Motor/Sensory Function Overall Oral Motor/Sensory Function: Within functional limits   Ice Chips     Thin Liquid Thin Liquid: Within functional limits Presentation: Cup;Self Fed    Nectar Thick     Honey Thick     Puree Puree: Impaired Presentation: Spoon Oral Phase Functional Implications: Prolonged oral transit Pharyngeal Phase Impairments: Multiple swallows   Solid     Solid: Not tested     Norleen IVAR Blase, MA, CCC-SLP Speech Therapy

## 2024-05-25 NOTE — Progress Notes (Signed)
 She has a left HH Easily arousable. Generally weak but nonfocal.  Right femoral bruising, no hematoma. No further evaluation needed.  Continue to monitor.

## 2024-05-25 NOTE — Evaluation (Signed)
 Speech Language Pathology Evaluation Patient Details Name: Donna Petty MRN: [id number] DOB: 1936-04-11 Today's Date: 05/25/2024 Time: 1415-1435 SLP Time Calculation (min) (ACUTE ONLY): 20 min  Problem List:  Patient Active Problem List   Diagnosis Date Noted   Gastroesophageal reflux disease 05/24/2024   Essential hypertension 05/24/2024   History of cerebrovascular accident 05/24/2024   Hyperlipidemia 05/24/2024   Dementia (HCC) 05/24/2024   Chronic pain syndrome 05/24/2024   Acute ischemic right PCA stroke (HCC) 05/24/2024   S/P total left hip arthroplasty 01/26/2024   Obese 06/27/2020   Left knee OA 06/26/2020   S/P left TKA 06/26/2020   Left-sided carotid artery disease 04/27/2014   Past Medical History:  Past Medical History:  Diagnosis Date   Acid reflux    Arthritis    Carotid artery disease    50-70% left internal carotid artery stenosis   Depression    Dyspnea    Hypothyroidism    Macular degeneration    Thyroid  condition    Visual changes    Past Surgical History:  Past Surgical History:  Procedure Laterality Date   CATARACT EXTRACTION, BILATERAL     CHOLECYSTECTOMY     COLONOSCOPY     ESOPHAGOGASTRODUODENOSCOPY ENDOSCOPY     IR ANGIO VERTEBRAL SEL SUBCLAVIAN INNOMINATE UNI R MOD SED  05/24/2024   IR PERCUTANEOUS ART THROMBECTOMY/INFUSION INTRACRANIAL INC DIAG ANGIO  05/24/2024   IR US  GUIDE VASC ACCESS RIGHT  05/24/2024   RADIOLOGY WITH ANESTHESIA N/A 05/24/2024   Procedure: RADIOLOGY WITH ANESTHESIA;  Surgeon: Radiologist, Medication, MD;  Location: [facility];  Service: Radiology;  Laterality: N/A;   TOTAL HIP ARTHROPLASTY Left 01/26/2024   Procedure: ARTHROPLASTY, HIP, TOTAL, ANTERIOR APPROACH;  Surgeon: Ernie Cough, MD;  Location: [facility];  Service: Orthopedics;  Laterality: Left;   TOTAL KNEE ARTHROPLASTY Left 06/26/2020   Procedure: TOTAL KNEE ARTHROPLASTY;  Surgeon: Ernie Cough, MD;  Location: [facility];  Service: Orthopedics;  Laterality: Left;  70  mins   HPI:  Patient is an 88 y.o. female with PMH: dementia, macular degeneration, hypothyroidism, CAD, depression. She lives with her daughter at baseline. She presented to the hospital on 05/24/24 after sudden onset left sided symptoms, leaning to the left and had right gaze deviation. In ED, she was hypertensive with left hemianopsia with left sided extinction. CT negative for large territory infarct or hemorrhage. MRI brain showed large left right PCA infarct, additional scattered small acute bilateral cerebral and cerebellar infarcts, moderate cerebral atrophy. TNK administered but unable to do thrombectomy.   Assessment / Plan / Recommendation Clinical Impression  Patient was alert and awake with her daughter, Leonette, at bedside. Patient was oriented to person, place, time, and situation but did not show awareness to her deficits. Patient showed left inattention throughout the session with a right gaze preference. She exhibited spontaneous movement of left hand but functionally would not use left hand or raise arm unless qued to attend to it. Patient did not consistently respond to open ended questions. Her responses were limited to one to two word utterances. She did not demonstrate functional problem solving and required SLP assistance for self feeding and attempt at grooming with comb. SLP will follow for cognitive/linguistic treatment. Patient will also benefit from SLP intervention upon discharge.    SLP Assessment  SLP Recommendation/Assessment: Patient needs continued Speech Language Pathology Services SLP Visit Diagnosis: Cognitive communication deficit (R41.841)     Assistance Recommended at Discharge     Functional Status Assessment Patient has had a recent  decline in their functional status and demonstrates the ability to make significant improvements in function in a reasonable and predictable amount of time.  Frequency and Duration min 2x/week  2 weeks      SLP  Evaluation Cognition  Overall Cognitive Status: Within Functional Limits for tasks assessed Arousal/Alertness: Awake/alert Orientation Level: Oriented X4 Year: 2025 Month: October Day of Week: Incorrect Attention: Sustained Sustained Attention: Appears intact Awareness: Impaired Awareness Impairment: Intellectual impairment;Emergent impairment;Anticipatory impairment Safety/Judgment: Impaired       Comprehension  Auditory Comprehension Overall Auditory Comprehension: Appears within functional limits for tasks assessed Yes/No Questions: Within Functional Limits Commands: Impaired One Step Basic Commands: 50-74% accurate (Would not respond to commands consistently) Interfering Components: Attention;Visual impairments Visual Recognition/Discrimination Discrimination: Exceptions to [facility] Pictures: Unable to indentify    Expression Expression Primary Mode of Expression: Verbal Verbal Expression Overall Verbal Expression: Appears within functional limits for tasks assessed Initiation: No impairment Naming: Not tested Pictures: Unable to indentify   Oral / Motor  Oral Motor/Sensory Function Overall Oral Motor/Sensory Function: Within functional limits Motor Speech Overall Motor Speech: Appears within functional limits for tasks assessed Respiration: Within functional limits Phonation: Normal Resonance: Within functional limits Articulation: Within functional limitis Intelligibility: Intelligible Motor Planning: Within functional limits Motor Speech Errors: Not applicable            Damien Hy  Graduate SLP Clinican

## 2024-05-25 NOTE — Progress Notes (Signed)
@  9584 Patients left pupil changed from a 3 to a 5, neuro status un-changed. Neurology notified and orders for CTA of head and neck ordered.  CTA completed at 0457, patient did vomit a large amount on the exam table.

## 2024-05-25 NOTE — Evaluation (Signed)
 Occupational Therapy Evaluation Patient Details Name: Donna Petty MRN: [id number] DOB: 1935/08/23 Today's Date: 05/25/2024   History of Present Illness   88 y.o. female who was at home with her daughter when she had sudden onset left-sided symptoms.Pt found to have R PCA occlusion, unable to do thrombectomy but did receive TNK. PMH: hypothyroidism, macular degeneration, carotid artery disease, depression     Clinical Impressions PT admitted with R PCA. Pt currently with functional limitiations due to the deficits listed below (see OT problem list). Pt at baseline indep with all adls and completes some IADLS Pt will benefit from skilled OT to increase their independence and safety with adls and balance to allow discharge Patient will benefit from intensive inpatient follow-up therapy, >3 hours/day  Call me Nichole.      If plan is discharge home, recommend the following:   Two people to help with walking and/or transfers;Two people to help with bathing/dressing/bathroom     Functional Status Assessment   Patient has had a recent decline in their functional status and demonstrates the ability to make significant improvements in function in a reasonable and predictable amount of time.     Equipment Recommendations   Wheelchair (measurements OT);Wheelchair cushion (measurements OT);BSC/3in1     Recommendations for Other Services   Rehab consult;PT consult;Speech consult     Precautions/Restrictions   Precautions Precautions: Fall Recall of Precautions/Restrictions: Impaired Precaution/Restrictions Comments: L neglect, SBP <180 Restrictions Weight Bearing Restrictions Per Provider Order: No     Mobility Bed Mobility Overal bed mobility: Needs Assistance Bed Mobility: Rolling, Sidelying to Sit, Sit to Supine Rolling: Max assist, +2 for physical assistance Sidelying to sit: Max assist, +2 for physical assistance   Sit to supine: Max assist, +2 for physical  assistance   General bed mobility comments: increased time, max verbal and tactile cues    Transfers Overall transfer level: Needs assistance Equipment used: 2 person hand held assist (face to face transfer with use of bed pad) Transfers: Sit to/from Stand Sit to Stand: Total assist, +2 physical assistance           General transfer comment: pt with noted increased fear, bilat knees blocked, totalA to WB on both LEs however L foot WBing on forefoot only      Balance Overall balance assessment: Needs assistance Sitting-balance support: Single extremity supported, Feet supported Sitting balance-Leahy Scale: Poor     Standing balance support: Bilateral upper extremity supported, During functional activity, Reliant on assistive device for balance Standing balance-Leahy Scale: Zero                             ADL either performed or assessed with clinical judgement   ADL Overall ADL's : Needs assistance/impaired Eating/Feeding: NPO   Grooming: Maximal assistance;Bed level   Upper Body Bathing: Maximal assistance;Bed level   Lower Body Bathing: Total assistance   Upper Body Dressing : Maximal assistance   Lower Body Dressing: Total assistance   Toilet Transfer: Maximal assistance;+2 for physical assistance;+2 for safety/equipment (sit <>stand)                   Vision Baseline Vision/History: 1 Wears glasses Ability to See in Adequate Light: 1 Impaired Vision Assessment?: Vision impaired- to be further tested in functional context Additional Comments: R gaze preference. pt able to track past midline. pt reports object not present with L quadrant. OT to further assess. pt needs head turns to locate  object in L visual field     Perception Perception: Impaired Preception Impairment Details: Inattention/Neglect Perception-Other Comments: LEft   Praxis         Pertinent Vitals/Pain Pain Assessment Pain Assessment: Faces Faces Pain Scale: No hurt      Extremity/Trunk Assessment Upper Extremity Assessment Upper Extremity Assessment: Right hand dominant;LUE deficits/detail LUE Deficits / Details: grasp 5 out 5, decreased cooridnation, pt with decreased shoulder flexion to 80 degrees LUE Coordination: decreased gross motor;decreased fine motor   Lower Extremity Assessment Lower Extremity Assessment: Defer to PT evaluation   Cervical / Trunk Assessment Cervical / Trunk Assessment: Normal   Communication Communication Communication: Impaired Factors Affecting Communication: Difficulty expressing self;Reduced clarity of speech   Cognition Arousal: Alert Behavior During Therapy: Flat affect Cognition: Cognition impaired     Awareness: Intellectual awareness intact, Online awareness impaired     Executive functioning impairment (select all impairments): Initiation OT - Cognition Comments: pt with decreased awareness to deficits . pt with delayed 10-15 second to respond to questions. no famiy present to confirm the home setup answers                 Following commands: Impaired Following commands impaired: Follows one step commands inconsistently (consistent with R UE and LE, needs tactile cues to follow with L UE and LE)     Cueing  General Comments   Cueing Techniques: Verbal cues;Gestural cues;Tactile cues  BP 170's remained below SBP 180   Exercises     Shoulder Instructions      Home Living Family/patient expects to be discharged to:: Private residence Living Arrangements: Children Available Help at Discharge: Family Type of Home: House Home Access: Stairs to enter Secretary/administrator of Steps: 5 Entrance Stairs-Rails: Right;Left;Can reach both Home Layout: One level     Bathroom Shower/Tub: Chief Strategy Officer: Standard Bathroom Accessibility: Yes   Home Equipment: Agricultural consultant (2 wheels);Rollator (4 wheels);Cane - single point   Additional Comments: has 2 cats Bae and  simon      Prior Functioning/Environment Prior Level of Function : Independent/Modified Independent             Mobility Comments: rollator for grocery shopping, no AD in the house ADLs Comments: indep with bathing, pt does how work like making her bed and cleaning the litter box    OT Problem List: Decreased strength;Decreased activity tolerance;Impaired balance (sitting and/or standing)   OT Treatment/Interventions: Self-care/ADL training;Therapeutic exercise;Neuromuscular education;Energy conservation;DME and/or AE instruction;Manual therapy;Therapeutic activities;Cognitive remediation/compensation;Visual/perceptual remediation/compensation;Patient/family education;Balance training      OT Goals(Current goals can be found in the care plan section)   Acute Rehab OT Goals Patient Stated Goal: to lay down OT Goal Formulation: With patient Time For Goal Achievement: 06/08/24 Potential to Achieve Goals: Good   OT Frequency:  Min 2X/week    Co-evaluation PT/OT/SLP Co-Evaluation/Treatment: Yes Reason for Co-Treatment: Complexity of the patient's impairments (multi-system involvement);For patient/therapist safety   OT goals addressed during session: ADL's and self-care;Strengthening/ROM      AM-PAC OT 6 Clicks Daily Activity     Outcome Measure Help from another person eating meals?: A Lot Help from another person taking care of personal grooming?: A Lot Help from another person toileting, which includes using toliet, bedpan, or urinal?: Total Help from another person bathing (including washing, rinsing, drying)?: A Lot Help from another person to put on and taking off regular upper body clothing?: A Lot Help from another person to put on and taking off regular  lower body clothing?: Total 6 Click Score: 10   End of Session Equipment Utilized During Treatment: Gait belt Nurse Communication: Mobility status;Precautions  Activity Tolerance: Patient tolerated treatment  well Patient left: in bed;with call bell/phone within reach  OT Visit Diagnosis: Unsteadiness on feet (R26.81);Muscle weakness (generalized) (M62.81);Hemiplegia and hemiparesis Hemiplegia - Right/Left: Left Hemiplegia - dominant/non-dominant: Non-Dominant Hemiplegia - caused by: Cerebral infarction                Time: 8848-8787 OT Time Calculation (min): 21 min Charges:  OT General Charges $OT Visit: 1 Visit OT Evaluation $OT Eval Moderate Complexity: 1 Mod   Brynn, OTR/L  Acute Rehabilitation Services Office: [phone] .   Ely Molt 05/25/2024, 3:48 PM

## 2024-05-25 NOTE — Progress Notes (Signed)
 Dr. Lester at bedside and shown IR site with bruising and some oozing. Stated no concerns at this time.

## 2024-05-26 ENCOUNTER — Encounter (HOSPITAL_COMMUNITY): Payer: Self-pay | Admitting: Neurology

## 2024-05-26 DIAGNOSIS — E785 Hyperlipidemia, unspecified: Secondary | ICD-10-CM | POA: Diagnosis not present

## 2024-05-26 DIAGNOSIS — I10 Essential (primary) hypertension: Secondary | ICD-10-CM

## 2024-05-26 DIAGNOSIS — I4892 Unspecified atrial flutter: Secondary | ICD-10-CM | POA: Diagnosis not present

## 2024-05-26 DIAGNOSIS — I739 Peripheral vascular disease, unspecified: Secondary | ICD-10-CM | POA: Diagnosis not present

## 2024-05-26 DIAGNOSIS — I63531 Cerebral infarction due to unspecified occlusion or stenosis of right posterior cerebral artery: Secondary | ICD-10-CM | POA: Diagnosis not present

## 2024-05-26 DIAGNOSIS — R29708 NIHSS score 8: Secondary | ICD-10-CM | POA: Diagnosis not present

## 2024-05-26 DIAGNOSIS — I63431 Cerebral infarction due to embolism of right posterior cerebral artery: Secondary | ICD-10-CM | POA: Diagnosis not present

## 2024-05-26 LAB — BASIC METABOLIC PANEL WITH GFR
Anion gap: 8 (ref 5–15)
BUN: 9 mg/dL (ref 8–23)
CO2: 28 mmol/L (ref 22–32)
Calcium: 8.7 mg/dL — ABNORMAL LOW (ref 8.9–10.3)
Chloride: 106 mmol/L (ref 98–111)
Creatinine, Ser: 1.05 mg/dL — ABNORMAL HIGH (ref 0.44–1.00)
GFR, Estimated: 52 mL/min — ABNORMAL LOW (ref >60)
Glucose, Bld: 100 mg/dL — ABNORMAL HIGH (ref 70–99)
Potassium: 3.8 mmol/L (ref 3.5–5.1)
Sodium: 142 mmol/L (ref 135–145)

## 2024-05-26 MED ORDER — POTASSIUM CHLORIDE CRYS ER 20 MEQ PO TBCR
40.0000 meq | EXTENDED_RELEASE_TABLET | Freq: Once | ORAL | Status: AC
  Administered 2024-05-26: 40 meq via ORAL
  Filled 2024-05-26: qty 2

## 2024-05-26 NOTE — Progress Notes (Signed)
 PT Cancellation Note  Patient Details Name: Donna Petty MRN: [id number] DOB: Nov 08, 1935   Cancelled Treatment:    Reason Eval/Treat Not Completed: Other (comment) - per RN, hold as pt transferring units. PT to check back as able.   [name], PT DPT Acute Rehabilitation Services Secure Chat Preferred  Office [phone]    [name] Kingdom 05/26/2024, 2:40 PM

## 2024-05-26 NOTE — Progress Notes (Addendum)
 STROKE TEAM PROGRESS NOTE    SIGNIFICANT HOSPITAL EVENTS 10/21: Patient admitted with left-sided weakness and right gaze deviation, mechanical thrombectomy attempted for right PCA P1 occlusion but unsuccessful due to extreme vertebral artery tortuosity 10/22- Transfer out of ICU   INTERIM HISTORY/SUBJECTIVE Currently in A-flutter, cardiology consulted, neuro exam is stable with right gaze preference, left field cut, mild left hemiparesis Transfer out of ICU, CIR pending   OBJECTIVE  CBC    Component Value Date/Time   WBC 6.1 05/24/2024 1056   RBC 4.38 05/24/2024 1056   HGB 13.6 05/24/2024 1058   HCT 40.0 05/24/2024 1058   PLT 294 05/24/2024 1056   MCV 91.6 05/24/2024 1056   MCH 30.1 05/24/2024 1056   MCHC 32.9 05/24/2024 1056   RDW 14.8 05/24/2024 1056   LYMPHSABS 2.2 05/24/2024 1056   MONOABS 0.7 05/24/2024 1056   EOSABS 0.2 05/24/2024 1056   BASOSABS 0.0 05/24/2024 1056    BMET    Component Value Date/Time   NA 142 05/26/2024 0755   K 3.8 05/26/2024 0755   CL 106 05/26/2024 0755   CO2 28 05/26/2024 0755   GLUCOSE 100 (H) 05/26/2024 0755   BUN 9 05/26/2024 0755   CREATININE 1.05 (H) 05/26/2024 0755   CALCIUM  8.7 (L) 05/26/2024 0755   GFRNONAA 52 (L) 05/26/2024 0755    IMAGING past 24 hours ECHOCARDIOGRAM COMPLETE Result Date: 05/25/2024    ECHOCARDIOGRAM REPORT   Patient Name:   Donna Petty Date of Exam: 05/25/2024 Medical Rec #:  [id number]     Height:       61.5 in Accession #:    [id number]    Weight:       170.6 lb Date of Birth:  [birth year]     BSA:          1.776 m Patient Age:    88 years      BP:           140/97 mmHg Patient Gender: F             HR:           84 bpm. Exam Location:  Inpatient Procedure: 2D Echo (Both Spectral and Color Flow Doppler were utilized during            procedure). Indications:   Stroke  History:       Patient has no prior history of Echocardiogram examinations.                Stroke.  Sonographer:   Norleen Amour Referring       [id number] [facility] Phys: IMPRESSIONS  1. Left ventricular ejection fraction, by estimation, is 60 to 65%. The left ventricle has normal function. The left ventricle has no regional wall motion abnormalities. There is mild asymmetric left ventricular hypertrophy of the basal-septal segment. Left ventricular diastolic parameters were normal.  2. Right ventricular systolic function is normal. The right ventricular size is normal. There is normal pulmonary artery systolic pressure.  3. The mitral valve is normal in structure. Mild mitral valve regurgitation. No evidence of mitral stenosis.  4. The aortic valve is grossly normal. There is mild calcification of the aortic valve. Aortic valve regurgitation is not visualized. Aortic valve sclerosis is present, with no evidence of aortic valve stenosis.  5. The inferior vena cava is normal in size with greater than 50% respiratory variability, suggesting right atrial pressure of 3 mmHg. Comparison(s): No prior Echocardiogram. Conclusion(s)/Recommendation(s): Otherwise normal echocardiogram, with minor abnormalities  described in the report. No intracardiac source of embolism detected on this transthoracic study. Consider a transesophageal echocardiogram to exclude cardiac source of embolism if clinically indicated. FINDINGS  Left Ventricle: Left ventricular ejection fraction, by estimation, is 60 to 65%. The left ventricle has normal function. The left ventricle has no regional wall motion abnormalities. The left ventricular internal cavity size was normal in size. There is  mild asymmetric left ventricular hypertrophy of the basal-septal segment. Left ventricular diastolic parameters were normal. Right Ventricle: The right ventricular size is normal. Right vetricular wall thickness was not well visualized. Right ventricular systolic function is normal. There is normal pulmonary artery systolic pressure. The tricuspid regurgitant velocity is 2.68 m/s, and with an  assumed right atrial pressure of 3 mmHg, the estimated right ventricular systolic pressure is 31.7 mmHg. Left Atrium: Left atrial size was normal in size. Right Atrium: Right atrial size was normal in size. Pericardium: There is no evidence of pericardial effusion. Mitral Valve: The mitral valve is normal in structure. Mild mitral valve regurgitation. No evidence of mitral valve stenosis. Tricuspid Valve: The tricuspid valve is grossly normal. Tricuspid valve regurgitation is trivial. No evidence of tricuspid stenosis. Aortic Valve: The aortic valve is grossly normal. There is mild calcification of the aortic valve. Aortic valve regurgitation is not visualized. Aortic valve sclerosis is present, with no evidence of aortic valve stenosis. Pulmonic Valve: The pulmonic valve was not well visualized. Pulmonic valve regurgitation is trivial. No evidence of pulmonic stenosis. Aorta: The aortic root and ascending aorta are structurally normal, with no evidence of dilitation. Venous: The inferior vena cava is normal in size with greater than 50% respiratory variability, suggesting right atrial pressure of 3 mmHg. IAS/Shunts: The atrial septum is grossly normal.  LEFT VENTRICLE PLAX 2D LVIDd:         4.40 cm     Diastology LVIDs:         2.50 cm     LV e' medial:    6.74 cm/s LV PW:         1.00 cm     LV E/e' medial:  14.2 LV IVS:        1.30 cm     LV e' lateral:   10.80 cm/s LVOT diam:     1.80 cm     LV E/e' lateral: 8.8 LV SV:         32 LV SV Index:   18 LVOT Area:     2.54 cm LV IVRT:       74 msec  LV Volumes (MOD) LV vol d, MOD A2C: 56.8 ml LV vol d, MOD A4C: 70.6 ml LV vol s, MOD A2C: 20.3 ml LV vol s, MOD A4C: 29.6 ml LV SV MOD A2C:     36.5 ml LV SV MOD A4C:     70.6 ml LV SV MOD BP:      43.9 ml RIGHT VENTRICLE             IVC RV Basal diam:  3.90 cm     IVC diam: 1.70 cm RV S prime:     11.40 cm/s TAPSE (M-mode): 2.0 cm LEFT ATRIUM             Index        RIGHT ATRIUM           Index LA diam:        4.00 cm  2.25 cm/m   RA Area:  15.00 cm LA Vol (A2C):   46.9 ml 26.40 ml/m  RA Volume:   37.10 ml  20.89 ml/m LA Vol (A4C):   45.7 ml 25.73 ml/m LA Biplane Vol: 47.5 ml 26.74 ml/m  AORTIC VALVE             PULMONIC VALVE LVOT Vmax:   60.00 cm/s  PV Vmax:       0.72 m/s LVOT Vmean:  42.300 cm/s PV Peak grad:  2.1 mmHg LVOT VTI:    0.127 m  AORTA Ao Root diam: 2.80 cm Ao Asc diam:  3.20 cm MITRAL VALVE               TRICUSPID VALVE MV Area (PHT): 3.74 cm    TR Peak grad:   28.7 mmHg MV Decel Time: 203 msec    TR Vmax:        268.00 cm/s MV E velocity: 95.40 cm/s MV A velocity: 63.40 cm/s  SHUNTS MV E/A ratio:  1.50        Systemic VTI:  0.13 m                            Systemic Diam: 1.80 cm Shelda Bruckner MD Electronically signed by Shelda Bruckner MD Signature Date/Time: 05/25/2024/6:04:15 PM    Final    MR BRAIN WO CONTRAST Result Date: 05/25/2024 EXAM: MRI BRAIN WITHOUT CONTRAST 05/25/2024 11:22:00 AM TECHNIQUE: Multiplanar multisequence MRI of the head/brain was performed without the administration of intravenous contrast. COMPARISON: CTA head and neck 05/25/2024. CLINICAL HISTORY: Acute neuro deficit, stroke suspected; 24 hours post tnkase. FINDINGS: LIMITATIONS: The examination is mildly to moderately motion degraded. BRAIN AND VENTRICLES: There is a large acute right PCA infarct involving the right temporal and occipital lobes as well as right thalamus with cytotoxic edema but no significant associated mass effect or hemorrhage. Additional scattered small acute infarcts are present in both cerebellar hemispheres, left temporal and occipital lobes, both parietal lobes, and posterior right frontal lobe as well as along the right caudate body. There are small chronic infarcts involving the right basal ganglia and both cerebellar hemispheres. Small chronic hemorrhages are present in the cerebellum and midbrain. Mild T2 hyperintensities elsewhere in the cerebral white matter bilaterally are  nonspecific but compatible with chronic small vessel ischemic disease. There is moderate cerebral atrophy. No mass, midline shift, hydrocephalus, or extra axial fluid collection is identified. Abnormal signal is noted along the course of the proximal right PCA corresponding to the occlusion shown on CTA. ORBITS: Bilateral cataract extraction. SINUSES AND MASTOIDS: Small right mastoid effusion. Clear paranasal sinuses. BONES AND SOFT TISSUES: Normal marrow signal. No acute soft tissue abnormality. IMPRESSION: 1. Large acute right PCA infarct. 2. Additional scattered small acute bilateral cerebral and cerebellar infarcts. 3. Moderate cerebral atrophy and mild chronic small vessel ischemic disease with chronic lacunar infarcts as above. Electronically signed by: Dasie Hamburg MD 05/25/2024 11:43 AM EDT RP Workstation: [id number]    Vitals:   05/26/24 0500 05/26/24 0600 05/26/24 0700 05/26/24 0800  BP:    (!) 159/61  Pulse: 83 72 80 68  Resp: 17 19 19  (!) 22  Temp:    99.3 F (37.4 C)  TempSrc:    Axillary  SpO2: 97% 97% 96% 97%  Weight:         PHYSICAL EXAM General: Ill-appearing elderly patient in no acute distress Psych:  Mood and affect appropriate for situation CV: Regular rate and rhythm on  monitor Respiratory:  Regular, unlabored respirations on room air   NEURO:  Mental Status: Patient responds to name and is able to state correct age and month Speech/Language: speech is with mild dysarthria but without aphasia  Cranial Nerves:  II: PERRL.  Left hemianopsia III, IV, VI: Right gaze deviation, unable to cross midline V: Sensation is intact to light touch and diminished on the left VII: Face is symmetrical resting and smiling VIII: hearing intact to voice. IX, X: Voice is mildly dysarthric KP:Dynloizm shrug 5/5. XII: tongue is midline without fasciculations. Motor: Able to move all 4 extremities with antigravity strength, but drift noted to the left arm Tone: is normal and bulk  is normal Sensation- Intact to light touch on the right but absent in the left arm and diminished in the left leg Coordination: FTN intact on the right Gait- deferred  Most Recent NIH  1a Level of Conscious.: 0 1b LOC Questions: 0 1c LOC Commands: 0 2 Best Gaze: 2 3 Visual: 2 4 Facial Palsy: 0 5a Motor Arm - left: 2 5b Motor Arm - Right: 0 6a Motor Leg - Left: 0 6b Motor Leg - Right: 0 7 Limb Ataxia: 0 8 Sensory: 1 9 Best Language: 0 10 Dysarthria: 1 11 Extinct. and Inatten.: 0 TOTAL: 8   ASSESSMENT/PLAN  Donna Petty is a 88 y.o. female with history of hypothyroidism, macular degeneration and depression admitted for left hemianopsia, right gaze deviation and left hemiparesis.  She was found to have a right PCA P1 occlusion, and mechanical thrombectomy was attempted.  Unfortunately, this procedure was unsuccessful due to extreme tortuosity of the vertebral artery. MRI reveals large acute right PCA infarct and a conditional scattered small bilateral acute cerebral and cerebellar infarcts. NIH on Admission 7  Acute Ischemic Infarct: Right PCA territory stroke with multiple small scattered infarcts in bilateral hemispheres Etiology:   cardioembolic from new onset atrial flutter as  code Stroke CT head No acute abnormality. Small vessel disease. ASPECTS 10.    CTA head & neck right PCA P1 segment occlusion, right ICA origin plaque with 60% stenosis, bilateral cervical ICA tortuosity and possible FMD, moderate left ICA siphon stenosis, mild right M2 branch stenoses MRI large acute right PCA infarct, additional scattered small acute bilateral cerebral and cerebellar infarcts, moderate cerebral atrophy and chronic small vessel ischemic disease 2D Echo 60-65%, LA normal  LDL 95 HgbA1c 5.4 VTE prophylaxis -SCDs No antithrombotic prior to admission, now on aspirin  81 mg daily and clopidogrel 75 mg daily then anticoagulation in 3-5 days. Therapy recommendations: CIR-  Disposition:  Pending  A- flutter Cardiology consulted Consider anticoagulation in 3 to 5 days given size of stroke EKG Replace K   Hypertension Home meds: None Stable Keep systolic blood pressure less than 180  Hyperlipidemia Home meds: None LDL 95, goal < 70 Add atorvastatin 40 mg daily Continue statin at discharge  Dysphagia Patient has post-stroke dysphagia, SLP consulted    Diet   DIET - DYS 1 Room service appropriate? Yes; Fluid consistency: Thin  Consider cortrack if family in agreement Advance diet as tolerated  Other Stroke Risk Factors Obesity, Body mass index is 31.72 kg/m., BMI >/= 30 associated with increased stroke risk, recommend weight loss, diet and exercise as appropriate   Other Active Problems None  Hospital day # 2  Patient seen and examined by NP/APP with MD. MD to update note as needed.   Jorene Last, DNP, FNP-BC [facility] Pager: [phone]  I  have personally obtained history,examined this patient, reviewed notes, independently viewed imaging studies, participated in medical decision making and plan of care.ROS completed by me personally and pertinent positives fully documented  I have made any additions or clarifications directly to the above note. Agree with note above.  Patient has a new onset A-flutter.  Will get cardiology consult.  She will need anticoagulation but will wait 3 to 5 days given large size of stroke.  Continue antiplatelet for now.  Mobilize out of bed.  Therapy consults.  Transfer to neurology floor bed.  No family at the bedside.  I called and left a message for the patient's daughter [name] to call me back to give her an update.   I personally spent a total of 50 minutes in the care of the patient today including getting/reviewing separately obtained history, performing a medically appropriate exam/evaluation, counseling and educating, placing orders, referring and communicating with other health care professionals,  documenting clinical information in the EHR, independently interpreting results, and coordinating care.         Eather Popp, MD Medical Director [facility] Pager: [phone] 05/26/2024 3:26 PM   To contact Stroke Continuity provider, please refer to [email or web address]. After hours, contact General Neurology

## 2024-05-26 NOTE — Progress Notes (Addendum)
 Patient arrived to room 3W37, daughter at bedside. VS stable, patient oriented to room and wants to be left alone. Bed alarm on and call bell in reach.   Patient upper and lower dentures in mouth, clothes, and makeup bag at bedside.

## 2024-05-26 NOTE — TOC Initial Note (Signed)
 Transition of Care [facility]) - Initial/Assessment Note    Patient Details  Name: Donna Petty MRN: [id number] Date of Birth: December 01, 1935  Transition of Care [facility]) CM/SW Contact:    Inocente GORMAN Kindle, LCSW Phone Number: 05/26/2024, 2:42 PM  Clinical Narrative:                 Patient admitted from home with acute infarct. She has a history with [facility] back in June. Await therapy evaluations.     Barriers to Discharge: Continued Medical Work up   Patient Goals and CMS Choice            Expected Discharge Plan and Services       Living arrangements for the past 2 months: Single Family Home                                      Prior Living Arrangements/Services Living arrangements for the past 2 months: Single Family Home   Patient language and need for interpreter reviewed:: Yes Do you feel safe going back to the place where you live?: Yes      Need for Family Participation in Patient Care: Yes (Comment) Care giver support system in place?: Yes (comment)   Criminal Activity/Legal Involvement Pertinent to Current Situation/Hospitalization: No - Comment as needed  Activities of Daily Living      Permission Sought/Granted                  Emotional Assessment Appearance:: Appears stated age     Orientation: : Oriented to Self, Oriented to Place, Oriented to  Time, Oriented to Situation Alcohol / Substance Use: Not Applicable Psych Involvement: No (comment)  Admission diagnosis:  Acute ischemic right PCA stroke [facility]) [I63.531] Patient Active Problem List   Diagnosis Date Noted   Gastroesophageal reflux disease 05/24/2024   Essential hypertension 05/24/2024   History of cerebrovascular accident 05/24/2024   Hyperlipidemia 05/24/2024   Dementia (HCC) 05/24/2024   Chronic pain syndrome 05/24/2024   Acute ischemic right PCA stroke (HCC) 05/24/2024   S/P total left hip arthroplasty 01/26/2024   Obese 06/27/2020   Left knee OA  06/26/2020   S/P left TKA 06/26/2020   Left-sided carotid artery disease 04/27/2014   PCP:  Corlis Pagan, NP Pharmacy:   [id number] [address] [place] - [address] [place] [address] Phone: [phone] Fax: [phone]     Social Drivers of Health (SDOH) Social History: SDOH Screenings   Food Insecurity: No Food Insecurity (05/25/2024)  Housing: Low Risk  (05/25/2024)  Transportation Needs: No Transportation Needs (05/25/2024)  Utilities: Not At Risk (05/25/2024)  Social Connections: Moderately Integrated (05/25/2024)  Tobacco Use: Low Risk  (01/26/2024)   SDOH Interventions:     Readmission Risk Interventions     No data to display

## 2024-05-26 NOTE — Consult Note (Addendum)
 Cardiology Consultation   Patient ID: Donna Petty MRN: [id number]; DOB: 04/08/1936  Admit date: 05/24/2024 Date of Consult: 05/26/2024  PCP:  Corlis Pagan, NP   [facility] Providers Cardiologist:  Lynwood Schilling, MD    Patient Profile: Donna Petty is a 88 y.o. female with a hx of hypothyroidism, carotid artery disease, GERD who is being seen 05/26/2024 for the evaluation of atrial flutter at the request of Dr. Rosemarie.  History of Present Illness: [name] is a 88 year old female with past medical history noted above.  She was seen remotely by Dr. Court in 2015 for carotid artery disease.  Presented to the ED on 10/21 with complaints of left-sided weakness.  Code stroke was activated and she was noted to be hypertensive with BP of 190 systolic.  She was found to have a right sided gaze with left hemianopsia and left-sided extinction on arrival to the ED.  CT of the head with no hemorrhage.  Found to have right PCA P1 occlusion and mechanical thrombectomy was attempted.  Unfortunately the procedure was unsuccessful due to extreme tortuosity of the vertebral artery.  MRI showed a large right acute PCA infarct and conditional scattered small bilateral acute cerebral and cerebellar infarcts.  She was started on aspirin  and Plavix via neurology.  Echocardiogram 10/22 with LVEF of 60 to 65%, no regional wall motion normality, mild asymmetric LVH of the basal septal segment, normal RV, mild MR.   Developed new onset atrial flutter the evening of 10/22. In review of telemetry this has been rate controlled in the 60-70s. She is unaware of the rhythm. Denies any palpitations or fluttering PTA.    Past Medical History:  Diagnosis Date   Acid reflux    Arthritis    Carotid artery disease    50-70% left internal carotid artery stenosis   Depression    Dyspnea    Hypothyroidism    Macular degeneration    Thyroid  condition    Visual changes     Past Surgical History:  Procedure  Laterality Date   CATARACT EXTRACTION, BILATERAL     CHOLECYSTECTOMY     COLONOSCOPY     ESOPHAGOGASTRODUODENOSCOPY ENDOSCOPY     IR ANGIO VERTEBRAL SEL SUBCLAVIAN INNOMINATE UNI R MOD SED  05/24/2024   IR PERCUTANEOUS ART THROMBECTOMY/INFUSION INTRACRANIAL INC DIAG ANGIO  05/24/2024   IR US  GUIDE VASC ACCESS RIGHT  05/24/2024   RADIOLOGY WITH ANESTHESIA N/A 05/24/2024   Procedure: RADIOLOGY WITH ANESTHESIA;  Surgeon: Radiologist, Medication, MD;  Location: [facility] OR;  Service: Radiology;  Laterality: N/A;   TOTAL HIP ARTHROPLASTY Left 01/26/2024   Procedure: ARTHROPLASTY, HIP, TOTAL, ANTERIOR APPROACH;  Surgeon: Ernie Cough, MD;  Location: [facility];  Service: Orthopedics;  Laterality: Left;   TOTAL KNEE ARTHROPLASTY Left 06/26/2020   Procedure: TOTAL KNEE ARTHROPLASTY;  Surgeon: Ernie Cough, MD;  Location: [facility];  Service: Orthopedics;  Laterality: Left;  70 mins    Scheduled Meds:  amLODipine  5 mg Oral Daily   aspirin   81 mg Oral Daily   atorvastatin  40 mg Oral Daily   Chlorhexidine  Gluconate Cloth  6 each Topical Daily   clopidogrel  75 mg Oral Daily   heparin injection (subcutaneous)  5,000 Units Subcutaneous Q8H   levothyroxine   100 mcg Oral Q0600   memantine   5 mg Oral BID   mirtazapine   15 mg Oral QHS   pantoprazole   40 mg Oral QHS   polyethylene glycol  17 g Oral BID  Continuous Infusions:  PRN Meds: acetaminophen  **OR** acetaminophen  (TYLENOL ) oral liquid 160 mg/5 mL **OR** acetaminophen , labetalol, methocarbamol , senna-docusate  Allergies:   No Known Allergies  Social History:   Social History   Socioeconomic History   Marital status: Widowed    Spouse name: Not on file   Number of children: Not on file   Years of education: Not on file   Highest education level: Not on file  Occupational History   Not on file  Tobacco Use   Smoking status: Never   Smokeless tobacco: Never  Vaping Use   Vaping status: Never Used  Substance and Sexual Activity   Alcohol  use: No   Drug use: No   Sexual activity: Not on file  Other Topics Concern   Not on file  Social History Narrative   Not on file   Social Drivers of Health   Financial Resource Strain: Not on file  Food Insecurity: No Food Insecurity (05/25/2024)   Hunger Vital Sign    Worried About Running Out of Food in the Last Year: Never true    Ran Out of Food in the Last Year: Never true  Transportation Needs: No Transportation Needs (05/25/2024)   PRAPARE - Administrator, Civil Service (Medical): No    Lack of Transportation (Non-Medical): No  Physical Activity: Not on file  Stress: Not on file  Social Connections: Moderately Integrated (05/25/2024)   Social Connection and Isolation Panel    Frequency of Communication with Friends and Family: Three times a week    Frequency of Social Gatherings with Friends and Family: Three times a week    Attends Religious Services: More than 4 times per year    Active Member of Clubs or Organizations: Yes    Attends Banker Meetings: More than 4 times per year    Marital Status: Widowed  Intimate Partner Violence: Not At Risk (05/25/2024)   Humiliation, Afraid, Rape, and Kick questionnaire    Fear of Current or Ex-Partner: No    Emotionally Abused: No    Physically Abused: No    Sexually Abused: No    Family History:    Family History  Problem Relation Age of Onset   Hypertension Father      ROS:  Please see the history of present illness.   All other ROS reviewed and negative.     Physical Exam/Data: Vitals:   05/26/24 0800 05/26/24 1157 05/26/24 1200 05/26/24 1511  BP: (!) 159/61  (!) 180/89 (!) 144/92  Pulse: 68  95 78  Resp: (!) 22  19 19   Temp: 99.3 F (37.4 C) 99.1 F (37.3 C)  98.8 F (37.1 C)  TempSrc: Axillary Axillary  Oral  SpO2: 97%  95% 96%  Weight:        Intake/Output Summary (Last 24 hours) at 05/26/2024 1533 Last data filed at 05/26/2024 1400 Gross per 24 hour  Intake --  Output  1100 ml  Net -1100 ml      05/24/2024   10:00 AM 01/26/2024    9:54 AM 01/19/2024    2:05 PM  Last 3 Weights  Weight (lbs) 170 lb 10.2 oz 171 lb 171 lb  Weight (kg) 77.4 kg 77.565 kg 77.565 kg     Body mass index is 31.72 kg/m.  General:  Well nourished, well developed, in no acute distress HEENT: normal Neck: no JVD Vascular: No carotid bruits; Distal pulses 2+ bilaterally Cardiac:  normal S1, S2; RRR; no murmur  Lungs:  clear to auscultation bilaterally, no wheezing, rhonchi or rales  Abd: soft, nontender, no hepatomegaly  Ext: no edema Musculoskeletal:  No deformities, BUE and BLE strength normal and equal Skin: warm and dry  Neuro: right sided gaze, no other focal abnormalities noted Psych:  Normal affect   EKG:  The EKG was personally reviewed and demonstrates:  Atrial flutter 83bpm, PVCs Telemetry:  Telemetry was personally reviewed and demonstrates:  Rate controlled atrial flutter, variable conduction, PVCs  Relevant CV Studies:  Echo: 05/25/2024  IMPRESSIONS     1. Left ventricular ejection fraction, by estimation, is 60 to 65%. The  left ventricle has normal function. The left ventricle has no regional  wall motion abnormalities. There is mild asymmetric left ventricular  hypertrophy of the basal-septal segment.  Left ventricular diastolic parameters were normal.   2. Right ventricular systolic function is normal. The right ventricular  size is normal. There is normal pulmonary artery systolic pressure.   3. The mitral valve is normal in structure. Mild mitral valve  regurgitation. No evidence of mitral stenosis.   4. The aortic valve is grossly normal. There is mild calcification of the  aortic valve. Aortic valve regurgitation is not visualized. Aortic valve  sclerosis is present, with no evidence of aortic valve stenosis.   5. The inferior vena cava is normal in size with greater than 50%  respiratory variability, suggesting right atrial pressure of 3 mmHg.    Comparison(s): No prior Echocardiogram.   Conclusion(s)/Recommendation(s): Otherwise normal echocardiogram, with  minor abnormalities described in the report. No intracardiac source of  embolism detected on this transthoracic study. Consider a transesophageal  echocardiogram to exclude cardiac  source of embolism if clinically indicated.   FINDINGS   Left Ventricle: Left ventricular ejection fraction, by estimation, is 60  to 65%. The left ventricle has normal function. The left ventricle has no  regional wall motion abnormalities. The left ventricular internal cavity  size was normal in size. There is   mild asymmetric left ventricular hypertrophy of the basal-septal segment.  Left ventricular diastolic parameters were normal.   Right Ventricle: The right ventricular size is normal. Right vetricular  wall thickness was not well visualized. Right ventricular systolic  function is normal. There is normal pulmonary artery systolic pressure.  The tricuspid regurgitant velocity is 2.68  m/s, and with an assumed right atrial pressure of 3 mmHg, the estimated  right ventricular systolic pressure is 31.7 mmHg.   Left Atrium: Left atrial size was normal in size.   Right Atrium: Right atrial size was normal in size.   Pericardium: There is no evidence of pericardial effusion.   Mitral Valve: The mitral valve is normal in structure. Mild mitral valve  regurgitation. No evidence of mitral valve stenosis.   Tricuspid Valve: The tricuspid valve is grossly normal. Tricuspid valve  regurgitation is trivial. No evidence of tricuspid stenosis.   Aortic Valve: The aortic valve is grossly normal. There is mild  calcification of the aortic valve. Aortic valve regurgitation is not  visualized. Aortic valve sclerosis is present, with no evidence of aortic  valve stenosis.   Pulmonic Valve: The pulmonic valve was not well visualized. Pulmonic valve  regurgitation is trivial. No evidence of  pulmonic stenosis.   Aorta: The aortic root and ascending aorta are structurally normal, with  no evidence of dilitation.   Venous: The inferior vena cava is normal in size with greater than 50%  respiratory variability, suggesting right atrial pressure of 3  mmHg.   IAS/Shunts: The atrial septum is grossly normal.   Laboratory Data: High Sensitivity Troponin:  No results for input(s): TROPONINIHS in the last 720 hours.   Chemistry Recent Labs  Lab 05/24/24 1056 05/24/24 1058 05/26/24 0755  NA 142 143 142  K 3.7 3.7 3.8  CL 108 105 106  CO2 27  --  28  GLUCOSE 93 90 100*  BUN 11 12 9   CREATININE 1.28* 1.40* 1.05*  CALCIUM  9.0  --  8.7*  GFRNONAA 41*  --  52*  ANIONGAP 7  --  8    Recent Labs  Lab 05/24/24 1056  PROT 6.2*  ALBUMIN 3.3*  AST 20  ALT 17  ALKPHOS 91  BILITOT 0.8   Lipids  Recent Labs  Lab 05/25/24 0308  CHOL 168  TRIG 167*  HDL 40*  LDLCALC 95  CHOLHDL 4.2    Hematology Recent Labs  Lab 05/24/24 1056 05/24/24 1058  WBC 6.1  --   RBC 4.38  --   HGB 13.2 13.6  HCT 40.1 40.0  MCV 91.6  --   MCH 30.1  --   MCHC 32.9  --   RDW 14.8  --   PLT 294  --    Thyroid  No results for input(s): TSH, FREET4 in the last 168 hours.  BNPNo results for input(s): BNP, PROBNP in the last 168 hours.  DDimer No results for input(s): DDIMER in the last 168 hours.  Radiology/Studies:  ECHOCARDIOGRAM COMPLETE Result Date: 05/25/2024    ECHOCARDIOGRAM REPORT   Patient Name:   RENATA GAMBINO Jurgensen Date of Exam: 05/25/2024 Medical Rec #:  [id number]     Height:       61.5 in Accession #:    [id number]    Weight:       170.6 lb Date of Birth:  [birth year]     BSA:          1.776 m Patient Age:    88 years      BP:           140/97 mmHg Patient Gender: F             HR:           84 bpm. Exam Location:  Inpatient Procedure: 2D Echo (Both Spectral and Color Flow Doppler were utilized during            procedure). Indications:   Stroke  History:       Patient has  no prior history of Echocardiogram examinations.                Stroke.  Sonographer:   Norleen Amour Referring      [id number] [facility] Phys: IMPRESSIONS  1. Left ventricular ejection fraction, by estimation, is 60 to 65%. The left ventricle has normal function. The left ventricle has no regional wall motion abnormalities. There is mild asymmetric left ventricular hypertrophy of the basal-septal segment. Left ventricular diastolic parameters were normal.  2. Right ventricular systolic function is normal. The right ventricular size is normal. There is normal pulmonary artery systolic pressure.  3. The mitral valve is normal in structure. Mild mitral valve regurgitation. No evidence of mitral stenosis.  4. The aortic valve is grossly normal. There is mild calcification of the aortic valve. Aortic valve regurgitation is not visualized. Aortic valve sclerosis is present, with no evidence of aortic valve stenosis.  5. The inferior vena cava is normal in size with greater than 50%  respiratory variability, suggesting right atrial pressure of 3 mmHg. Comparison(s): No prior Echocardiogram. Conclusion(s)/Recommendation(s): Otherwise normal echocardiogram, with minor abnormalities described in the report. No intracardiac source of embolism detected on this transthoracic study. Consider a transesophageal echocardiogram to exclude cardiac source of embolism if clinically indicated. FINDINGS  Left Ventricle: Left ventricular ejection fraction, by estimation, is 60 to 65%. The left ventricle has normal function. The left ventricle has no regional wall motion abnormalities. The left ventricular internal cavity size was normal in size. There is  mild asymmetric left ventricular hypertrophy of the basal-septal segment. Left ventricular diastolic parameters were normal. Right Ventricle: The right ventricular size is normal. Right vetricular wall thickness was not well visualized. Right ventricular systolic function is normal.  There is normal pulmonary artery systolic pressure. The tricuspid regurgitant velocity is 2.68 m/s, and with an assumed right atrial pressure of 3 mmHg, the estimated right ventricular systolic pressure is 31.7 mmHg. Left Atrium: Left atrial size was normal in size. Right Atrium: Right atrial size was normal in size. Pericardium: There is no evidence of pericardial effusion. Mitral Valve: The mitral valve is normal in structure. Mild mitral valve regurgitation. No evidence of mitral valve stenosis. Tricuspid Valve: The tricuspid valve is grossly normal. Tricuspid valve regurgitation is trivial. No evidence of tricuspid stenosis. Aortic Valve: The aortic valve is grossly normal. There is mild calcification of the aortic valve. Aortic valve regurgitation is not visualized. Aortic valve sclerosis is present, with no evidence of aortic valve stenosis. Pulmonic Valve: The pulmonic valve was not well visualized. Pulmonic valve regurgitation is trivial. No evidence of pulmonic stenosis. Aorta: The aortic root and ascending aorta are structurally normal, with no evidence of dilitation. Venous: The inferior vena cava is normal in size with greater than 50% respiratory variability, suggesting right atrial pressure of 3 mmHg. IAS/Shunts: The atrial septum is grossly normal.  LEFT VENTRICLE PLAX 2D LVIDd:         4.40 cm     Diastology LVIDs:         2.50 cm     LV e' medial:    6.74 cm/s LV PW:         1.00 cm     LV E/e' medial:  14.2 LV IVS:        1.30 cm     LV e' lateral:   10.80 cm/s LVOT diam:     1.80 cm     LV E/e' lateral: 8.8 LV SV:         32 LV SV Index:   18 LVOT Area:     2.54 cm LV IVRT:       74 msec  LV Volumes (MOD) LV vol d, MOD A2C: 56.8 ml LV vol d, MOD A4C: 70.6 ml LV vol s, MOD A2C: 20.3 ml LV vol s, MOD A4C: 29.6 ml LV SV MOD A2C:     36.5 ml LV SV MOD A4C:     70.6 ml LV SV MOD BP:      43.9 ml RIGHT VENTRICLE             IVC RV Basal diam:  3.90 cm     IVC diam: 1.70 cm RV S prime:     11.40 cm/s  TAPSE (M-mode): 2.0 cm LEFT ATRIUM             Index        RIGHT ATRIUM           Index LA  diam:        4.00 cm 2.25 cm/m   RA Area:     15.00 cm LA Vol (A2C):   46.9 ml 26.40 ml/m  RA Volume:   37.10 ml  20.89 ml/m LA Vol (A4C):   45.7 ml 25.73 ml/m LA Biplane Vol: 47.5 ml 26.74 ml/m  AORTIC VALVE             PULMONIC VALVE LVOT Vmax:   60.00 cm/s  PV Vmax:       0.72 m/s LVOT Vmean:  42.300 cm/s PV Peak grad:  2.1 mmHg LVOT VTI:    0.127 m  AORTA Ao Root diam: 2.80 cm Ao Asc diam:  3.20 cm MITRAL VALVE               TRICUSPID VALVE MV Area (PHT): 3.74 cm    TR Peak grad:   28.7 mmHg MV Decel Time: 203 msec    TR Vmax:        268.00 cm/s MV E velocity: 95.40 cm/s MV A velocity: 63.40 cm/s  SHUNTS MV E/A ratio:  1.50        Systemic VTI:  0.13 m                            Systemic Diam: 1.80 cm Shelda Bruckner MD Electronically signed by Shelda Bruckner MD Signature Date/Time: 05/25/2024/6:04:15 PM    Final    MR BRAIN WO CONTRAST Result Date: 05/25/2024 EXAM: MRI BRAIN WITHOUT CONTRAST 05/25/2024 11:22:00 AM TECHNIQUE: Multiplanar multisequence MRI of the head/brain was performed without the administration of intravenous contrast. COMPARISON: CTA head and neck 05/25/2024. CLINICAL HISTORY: Acute neuro deficit, stroke suspected; 24 hours post tnkase. FINDINGS: LIMITATIONS: The examination is mildly to moderately motion degraded. BRAIN AND VENTRICLES: There is a large acute right PCA infarct involving the right temporal and occipital lobes as well as right thalamus with cytotoxic edema but no significant associated mass effect or hemorrhage. Additional scattered small acute infarcts are present in both cerebellar hemispheres, left temporal and occipital lobes, both parietal lobes, and posterior right frontal lobe as well as along the right caudate body. There are small chronic infarcts involving the right basal ganglia and both cerebellar hemispheres. Small chronic hemorrhages are present in  the cerebellum and midbrain. Mild T2 hyperintensities elsewhere in the cerebral white matter bilaterally are nonspecific but compatible with chronic small vessel ischemic disease. There is moderate cerebral atrophy. No mass, midline shift, hydrocephalus, or extra axial fluid collection is identified. Abnormal signal is noted along the course of the proximal right PCA corresponding to the occlusion shown on CTA. ORBITS: Bilateral cataract extraction. SINUSES AND MASTOIDS: Small right mastoid effusion. Clear paranasal sinuses. BONES AND SOFT TISSUES: Normal marrow signal. No acute soft tissue abnormality. IMPRESSION: 1. Large acute right PCA infarct. 2. Additional scattered small acute bilateral cerebral and cerebellar infarcts. 3. Moderate cerebral atrophy and mild chronic small vessel ischemic disease with chronic lacunar infarcts as above. Electronically signed by: Dasie Hamburg MD 05/25/2024 11:43 AM EDT RP Workstation: [id number]   IR PERCUTANEOUS ART THROMBECTOMY/INFUSION INTRACRANIAL INC DIAG ANGIO Result Date: 05/25/2024 INDICATION: Acute Large Vessel occlusion, right posterior cerebral artery EXAM: Mechanical thrombectomy, attempted CONSENT: Consent was obtained from the patient's daughter MEDICATIONS: No additional ANESTHESIA/SEDATION: General CONTRAST:  42mL OMNIPAQUE IOHEXOL 300 MG/ML  SOLN FLUOROSCOPY: Radiation Exposure Index (as provided by the fluoroscopic device): 985 MGy reference air Kerma COMPLICATIONS: None immediate. TECHNIQUE: Maximal Sterile  Barrier Technique was utilized including caps, mask, sterile gowns, sterile gloves, sterile drape, hand hygiene and skin antiseptic. A timeout was performed prior to the initiation of the procedure. PROCEDURE: Femoral access was obtained with ultrasound using direct visualization of the needle puncture into the artery. A 80 cm neuron max sheath was placed. A penumbra select Berenstein catheter was then used to catheterize the left vertebral artery for  angiography. This demonstrated an extremely tortuous left vertebral artery. The intracranial images demonstrated occlusion of the right posterior cerebral artery. The left was patent. The basilar artery was normal. The neuron max was advanced over the penumbra select Berenstein into the origin of the left vertebral artery. An attempt was made to pass a catalyst 6 aspiration catheter over a Trevo Trak microcatheter and synchro support wire, which was unsuccessful due to tortuosity of the vertebral artery. The neuron max catheter was exchanged over a Bentson wire and a 55 cm 8 French Terumo sheath was placed for more support. An attempt was made to advance a zoom support catheter into the vertebral artery which was not successful. The neuron max was then replaced and advanced into the origin of the vertebral artery. It was then possible to advance a Vecta 46 catheter over the TREVO TRAK microcatheter and synchro support wire about midway up the vertebral artery, but no further. An Amplatz wire was then advanced through the Vecta 46 and a penumbra select Esaw was advanced over the wire which permitted advancement of the neuron max about a 3rd of the way up the vertebral artery. It was then possible to advance the Vecta 46 catheter over the TREVO TRAK microcatheter into the lower basilar artery. However, the catheter could not be advanced into the right posterior cerebral artery due to the extreme proximal tortuosity. The left vertebral artery approach was abandoned. A Simmons 2 catheter was used for catheterization of the right subclavian artery and right vertebral origin to visualize the right vertebral artery. This also had extreme tortuosity and the decision was made not to attempt catheterization as it was not likely to be successful. Hemostasis was obtained with Angio-Seal. FINDINGS: Left vertebral: The left vertebral artery is extremely tortuous. It is otherwise normal. The intracranial images demonstrate a  normal basilar artery and normal left posterior cerebral artery. There is a proximal occlusion of the right posterior cerebral artery. Right vertebral: The right vertebral artery is extremely tortuous. Otherwise normal. Left vertebral after attempted thrombectomy: There is no dissection or other complication. The intracranial images are unchanged. Right posterior cerebral artery occlusion but no other occlusion. IMPRESSION: Right posterior cerebral artery embolus Embolectomy unsuccessful due to extreme proximal tortuosity of the vertebral arteries. Codes: 62784, [id number], [id number] Electronically Signed   By: Nancyann Burns M.D.   On: 05/25/2024 08:51   IR US  Guide Vasc Access Right Result Date: 05/25/2024 INDICATION: Acute Large Vessel occlusion, right posterior cerebral artery EXAM: Mechanical thrombectomy, attempted CONSENT: Consent was obtained from the patient's daughter MEDICATIONS: No additional ANESTHESIA/SEDATION: General CONTRAST:  42mL OMNIPAQUE IOHEXOL 300 MG/ML  SOLN FLUOROSCOPY: Radiation Exposure Index (as provided by the fluoroscopic device): 985 MGy reference air Kerma COMPLICATIONS: None immediate. TECHNIQUE: Maximal Sterile Barrier Technique was utilized including caps, mask, sterile gowns, sterile gloves, sterile drape, hand hygiene and skin antiseptic. A timeout was performed prior to the initiation of the procedure. PROCEDURE: Femoral access was obtained with ultrasound using direct visualization of the needle puncture into the artery. A 80 cm neuron max sheath was placed. A penumbra select  Berenstein catheter was then used to catheterize the left vertebral artery for angiography. This demonstrated an extremely tortuous left vertebral artery. The intracranial images demonstrated occlusion of the right posterior cerebral artery. The left was patent. The basilar artery was normal. The neuron max was advanced over the penumbra select Berenstein into the origin of the left vertebral artery. An attempt  was made to pass a catalyst 6 aspiration catheter over a Trevo Trak microcatheter and synchro support wire, which was unsuccessful due to tortuosity of the vertebral artery. The neuron max catheter was exchanged over a Bentson wire and a 55 cm 8 French Terumo sheath was placed for more support. An attempt was made to advance a zoom support catheter into the vertebral artery which was not successful. The neuron max was then replaced and advanced into the origin of the vertebral artery. It was then possible to advance a Vecta 46 catheter over the TREVO TRAK microcatheter and synchro support wire about midway up the vertebral artery, but no further. An Amplatz wire was then advanced through the Vecta 46 and a penumbra select Esaw was advanced over the wire which permitted advancement of the neuron max about a 3rd of the way up the vertebral artery. It was then possible to advance the Vecta 46 catheter over the TREVO TRAK microcatheter into the lower basilar artery. However, the catheter could not be advanced into the right posterior cerebral artery due to the extreme proximal tortuosity. The left vertebral artery approach was abandoned. A Simmons 2 catheter was used for catheterization of the right subclavian artery and right vertebral origin to visualize the right vertebral artery. This also had extreme tortuosity and the decision was made not to attempt catheterization as it was not likely to be successful. Hemostasis was obtained with Angio-Seal. FINDINGS: Left vertebral: The left vertebral artery is extremely tortuous. It is otherwise normal. The intracranial images demonstrate a normal basilar artery and normal left posterior cerebral artery. There is a proximal occlusion of the right posterior cerebral artery. Right vertebral: The right vertebral artery is extremely tortuous. Otherwise normal. Left vertebral after attempted thrombectomy: There is no dissection or other complication. The intracranial images  are unchanged. Right posterior cerebral artery occlusion but no other occlusion. IMPRESSION: Right posterior cerebral artery embolus Embolectomy unsuccessful due to extreme proximal tortuosity of the vertebral arteries. Codes: 62784, [id number], [id number] Electronically Signed   By: Nancyann Burns M.D.   On: 05/25/2024 08:51   IR ANGIO VERTEBRAL SEL SUBCLAVIAN INNOMINATE UNI R MOD SED Result Date: 05/25/2024 INDICATION: Acute Large Vessel occlusion, right posterior cerebral artery EXAM: Mechanical thrombectomy, attempted CONSENT: Consent was obtained from the patient's daughter MEDICATIONS: No additional ANESTHESIA/SEDATION: General CONTRAST:  42mL OMNIPAQUE IOHEXOL 300 MG/ML  SOLN FLUOROSCOPY: Radiation Exposure Index (as provided by the fluoroscopic device): 985 MGy reference air Kerma COMPLICATIONS: None immediate. TECHNIQUE: Maximal Sterile Barrier Technique was utilized including caps, mask, sterile gowns, sterile gloves, sterile drape, hand hygiene and skin antiseptic. A timeout was performed prior to the initiation of the procedure. PROCEDURE: Femoral access was obtained with ultrasound using direct visualization of the needle puncture into the artery. A 80 cm neuron max sheath was placed. A penumbra select Berenstein catheter was then used to catheterize the left vertebral artery for angiography. This demonstrated an extremely tortuous left vertebral artery. The intracranial images demonstrated occlusion of the right posterior cerebral artery. The left was patent. The basilar artery was normal. The neuron max was advanced over the penumbra select Berenstein into the  origin of the left vertebral artery. An attempt was made to pass a catalyst 6 aspiration catheter over a Trevo Trak microcatheter and synchro support wire, which was unsuccessful due to tortuosity of the vertebral artery. The neuron max catheter was exchanged over a Bentson wire and a 55 cm 8 French Terumo sheath was placed for more support. An attempt  was made to advance a zoom support catheter into the vertebral artery which was not successful. The neuron max was then replaced and advanced into the origin of the vertebral artery. It was then possible to advance a Vecta 46 catheter over the TREVO TRAK microcatheter and synchro support wire about midway up the vertebral artery, but no further. An Amplatz wire was then advanced through the Vecta 46 and a penumbra select Esaw was advanced over the wire which permitted advancement of the neuron max about a 3rd of the way up the vertebral artery. It was then possible to advance the Vecta 46 catheter over the TREVO TRAK microcatheter into the lower basilar artery. However, the catheter could not be advanced into the right posterior cerebral artery due to the extreme proximal tortuosity. The left vertebral artery approach was abandoned. A Simmons 2 catheter was used for catheterization of the right subclavian artery and right vertebral origin to visualize the right vertebral artery. This also had extreme tortuosity and the decision was made not to attempt catheterization as it was not likely to be successful. Hemostasis was obtained with Angio-Seal. FINDINGS: Left vertebral: The left vertebral artery is extremely tortuous. It is otherwise normal. The intracranial images demonstrate a normal basilar artery and normal left posterior cerebral artery. There is a proximal occlusion of the right posterior cerebral artery. Right vertebral: The right vertebral artery is extremely tortuous. Otherwise normal. Left vertebral after attempted thrombectomy: There is no dissection or other complication. The intracranial images are unchanged. Right posterior cerebral artery occlusion but no other occlusion. IMPRESSION: Right posterior cerebral artery embolus Embolectomy unsuccessful due to extreme proximal tortuosity of the vertebral arteries. Codes: 62784, [id number], [id number] Electronically Signed   By: Nancyann Burns M.D.   On:  05/25/2024 08:51   CT ANGIO HEAD NECK W WO CM Result Date: 05/25/2024 EXAM: CTA Head and Neck with Intravenous Contrast. CT Head without Contrast. CLINICAL HISTORY: 88 year old female with headache, neuro deficit, left pupil change, and profuse vomiting. Status post code stroke presentation yesterday with right PCA occlusion. TECHNIQUE: Axial CTA images of the head and neck performed with 75 mL of iohexol (OMNIPAQUE) 350 MG/ML injection. MIP reconstructed images were created and reviewed. Axial computed tomography images of the head/brain performed without intravenous contrast. Note: Per PQRS, the description of internal carotid artery percent stenosis, including 0 percent or normal exam, is based on [place] American Symptomatic Carotid Endarterectomy Trial (NASCET) criteria. Dose reduction technique was used including one or more of the following: automated exposure control, adjustment of mA and kV according to patient size, and/or iterative reconstruction. CONTRAST: With 75 mL of iohexol (OMNIPAQUE) 350 MG/ML injection. COMPARISON: CT head and CTA head and neck 05/24/2024. FINDINGS: CT HEAD: BRAIN: Fairly subtle asymmetric hypodensity now in the right PCA territory such as on coronal image 23 of series 8. This is compatible with cytotoxic edema. No significant intracranial mass effect. No hemorrhagic transformation. Elsewhere stable noncontrast CT appearance of the brain including chronic right basal ganglia lacunar infarct. No acute intraparenchymal hemorrhage. No mass lesion. No midline shift or extra-axial collection. VENTRICLES: No hydrocephalus. ORBITS: The orbits are unremarkable. SINUSES AND  MASTOIDS: The paranasal sinuses and mastoid air cells are clear. CTA NECK: COMMON CAROTID ARTERIES: No significant stenosis. No dissection or occlusion. INTERNAL CAROTID ARTERIES: Stable right carotid bifurcation atherosclerosis resulting in estimated 60% right ICA origin stenosis. Stable left carotid bifurcation  atherosclerosis without stenosis. 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 No dissection or occlusion. VERTEBRAL ARTERIES: Stable left subclavian artery and left vertebral artery origin atherosclerosis with mild stenosis of the left vertebral artery origin suspected and stable. Comparatively mild proximal right subclavian artery atherosclerosis. Both cervical vertebral arteries remain patent and highly tortuous to the skull base. No dissection or occlusion. CTA HEAD: ANTERIOR CEREBRAL ARTERIES: No significant stenosis. No occlusion. No aneurysm. MIDDLE CEREBRAL ARTERIES: No significant stenosis. No occlusion. No aneurysm. POSTERIOR CEREBRAL ARTERIES: Stable posterior circulation, unchanged right PCA P1 segment occlusion. No aneurysm. BASILAR ARTERY: No significant stenosis. No occlusion. No aneurysm. INTRACRANIAL INTERNAL CAROTID ARTERIES: Mild ICA siphon atherosclerosis. OTHER: Stable aortic arch atherosclerosis. Major dural venous sinuses are enhancing and appear to be patent. SOFT TISSUES: Visible upper chest stable nonvascular neck soft tissues are stable. No acute finding. No masses or lymphadenopathy. BONES: The osseous structures are stable from the CTA yesterday. IMPRESSION: 1. Early right PCA territory cytotoxic edema without hemorrhagic transformation or mass effect. 2. Stable CTA head and neck since yesterday. Unchanged right PCA P1 segment occlusion. 60% stenosis right ICA origin. 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 Electronically signed by: Helayne Hurst MD 05/25/2024 05:27 AM EDT RP Workstation: [id number]   CT ANGIO HEAD NECK W WO CM (CODE STROKE) Result Date: 05/24/2024 EXAM: CTA HEAD AND NECK WITHOUT AND WITH 05/24/2024 11:11:18 AM TECHNIQUE: CTA of the head and neck was performed without and with the administration of 75 mL of intravenous contrast (iohexol (OMNIPAQUE) 350 MG/ML injection 75 mL IOHEXOL 350 MG/ML SOLN).  Multiplanar 2D and/or 3D reformatted images are provided for review. Automated exposure control, iterative reconstruction, and/or weight based adjustment of the mA/kV was utilized to reduce the radiation dose to as low as reasonably achievable. Stenosis of the internal carotid arteries measured using NASCET criteria. COMPARISON: CT recorded separately. CLINICAL HISTORY: 88 year old female. Neuro deficit, acute, stroke suspected. Code stroke. FINDINGS: CTA NECK: AORTIC ARCH AND ARCH VESSELS: 3 vessel aortic arch with intermittent calcified atherosclerosis. CERVICAL CAROTID ARTERIES: Mildly tortuous brachiocephalic artery and right CCA. Right ICA origin calcified plaque narrowing the vessel to 1.9 mm versus 4.7 mm normally, resulting in 59% stenosis. Tortuous right ICA below the skull base. Calcified plaque at the distal left CCA and into the left carotid bifurcation resulting in less than 50% stenosis. Highly tortuous left ICA distal to the bulb also with a beaded appearance suggesting fibromuscular dysplasia (series 17 image 23). CERVICAL VERTEBRAL ARTERIES: Proximal right subclavian calcified plaque not resulting in significant stenosis of the proximal right subclavian artery or the right vertebral artery origin. Tortuous right vertebral artery is patent to the skull base without additional plaque. Moderate soft and calcified plaque of the proximal left subclavian artery and left vertebral artery origin resulting in less than 50% stenosis. Tortuous and codominant left vertebral artery is patent to the skull base, mildly beaded appearance and mild V3 segment calcified plaque. LUNGS AND MEDIASTINUM: Negative visible superior mediastinum and lungs. SOFT TISSUES: Coarsely calcified left thyroid  appears benign (no follow up imaging recommended). No acute abnormality. BONES: Absent dentition, hyperostosis and degeneration in the visible spine. No acute abnormality. CTA HEAD: ANTERIOR CIRCULATION:  Both ICA siphons are  patent. Left siphon atherosclerosis with up to moderate supraclinoid left siphon stenosis on series 13 image 107. Right siphon plaque without significant stenosis. Patent carotid terminus, normal MCA and ACA origins. Normal anterior communicating artery with a median artery of the corpus callosum (normal variant). Bilateral MCA and ACA branches are patent. Isolated mild stenosis of right MCA M2 branches. No aneurysm. POSTERIOR CIRCULATION: Codominant distal vertebral arteries, vertebrobasilar junction, and hypo origins are patent without plaque or stenosis. Patent basilar artery. Patent PCA origins. Right PCA P1 segment is occluded abruptly (series 17 image 27), with no reconstituted flow. Left PCA is patent with minimal limits. No aneurysm. OTHER: Major dural venous sinuses appear enhancing and patent. IMPRESSION: 1. Right PCA P1 segment occlusion. This was discussed by telephone with Dr. Vanessa at 1112 hours. 2. Bilateral proximal subclavian artery and vertebral artery origin atherosclerosis without hemodynamically significant stenosis. 3. Right ICA origin plaque with 60% stenosis. Bilateral cervical ICA tortuosity and possible fibromuscular dysplasia (FMD). 4. LICA siphon moderate supraclinoid stenosis.  Mild Right M2 branch stenoses. Electronically signed by: Helayne Hurst MD 05/24/2024 11:25 AM EDT RP Workstation: [id number]   CT HEAD CODE STROKE WO CONTRAST Result Date: 05/24/2024 EXAM: CT HEAD WITHOUT CONTRAST 05/24/2024 10:57:56 AM TECHNIQUE: CT of the head was performed without the administration of intravenous contrast. Automated exposure control, iterative reconstruction, and/or weight based adjustment of the mA/kV was utilized to reduce the radiation dose to as low as reasonably achievable. COMPARISON: Head CT 829 23. CLINICAL HISTORY: 88 year old female with acute neuro deficit, stroke suspected, right gaze, left side weakness, LKW 1000. FINDINGS: BRAIN AND VENTRICLES: No acute hemorrhage. No  evidence of acute infarct. Chronic lacunar infarct of the right basal ganglia is stable. Brain volume and ventricle size are stable. Chronic basal ganglia vascular calcifications. Mild additional patchy and scattered white matter hypodensity for age as not significantly changed. No hydrocephalus. No extra-axial collection. No mass effect or midline shift. No suspicious intracranial vascular hyperdensity. ORBITS: No acute abnormality. SINUSES: No acute abnormality. SOFT TISSUES AND SKULL: Mild rightward gaze. No acute soft tissue abnormality. No skull fracture. [place] stroke program early CT score (aspects) ----- Ganglionic (caudate, ic, lentiform nucleus, insula, M1-m3): 7 Supraganglionic (m4-m6): 3 Total: 10 IMPRESSION: 1. ASPECTS 10.  Stable chronic small vessel disease by CT. 2. These results were communicated to Dr. Vanessa at 11:04 hours on 05/24/2024 by text page via the [facility] messaging system. Electronically signed by: Helayne Hurst MD 05/24/2024 11:06 AM EDT RP Workstation: [id number]     Assessment and Plan:  Donna Petty is a 88 y.o. female with a hx of hypothyroidism, carotid artery disease, GERD who is being seen 05/26/2024 for the evaluation of atrial flutter at the request of Dr. Rosemarie.  New onset atrial flutter -- developed the evening of 10/22, rates well controlled in the 60-70 range -- she is asymptomatic, no palpitations/fluttering PTA -- echo with normal LV function, normal RV -- ChadsVasc of 5, therefore would recommendation starting Eliquis 5mg  BID once clear from neurology standpoint, 3-5 days -- no need for AVN at this time  Acute CVA, right PCA territory -- mechanical thrombectomy attempted but unsuccessful -- currently on ASA, plavix but now with dx of atrial flutter would transition to Eliquis once ok with neurology -- on statin   HLD -- LDL 95, HDL 40 -- on atorvastatin 40mg  daily   Hypothyroidism -- check TSH -- continue synthroid   HTN -- continue  amlodipine 5mg  daily  Risk Assessment/Risk Scores:   CHA2DS2-VASc Score = 6  This indicates a 7.2% annual risk of stroke. The patient's score is based upon: CHF History: 0 HTN History: 1 Diabetes History: 0 Stroke History: 2 Vascular Disease History: 0 Age Score: 2 Gender Score: 1   For questions or updates, please contact [facility] Please consult [email or web address] for contact info under   Signed, Manuelita Rummer, NP  05/26/2024 3:33 PM    History and all data above reviewed.  I personally took the history today, performed the physical exam and formulated the substantive portion of the assessment and plan.  I reviewed all relevant tests and studies.  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  She lives with her daughter because she has had falls.  She says she just does not pick her feet up and now uses a walker.  She has not fallen since she has been with her daughter.  She has not had any syncope or presyncope.  She does not feel palpitations.  She presented with a stroke as above and is found to be in coarse atrial fibrillation with controlled rate.  She does not notice this.   Patient examined.  I agree with the findings as above.  The patient exam reveals COR: Irregular, no murmurs,  Lungs: Clear to auscultation bilaterally,  Abd: Positive bowel sounds normal frequency and pitch, no bruits, rebound, Ext 2+ pulses, no edema.    All available labs, radiology testing, previous records reviewed.   Agree with documented assessment and plan.   Atrial fibrillation: This developed after admission.  She does not feel this.  She is currently on aspirin  and Plavix but should be on DOAC when cleared by by neurology probably in 3 to 5 days.  The patient describes no contraindication to anticoagulation.  She has good rate control for now.  I probably pursue rate control strategy long-term but we will consider rhythm management on follow-up.  AKI:  Creatinine is slowly improving since admission.  We will follow  Dyslipidemia: Continue current statin.  Acute CVA: She is in large right infarct of the PCA.  Echo is as demonstrated above with well-preserved ejection fraction.  She has new onset a fibrillation.  She had an attempted thrombectomy.    Lynwood Schilling  3:43 PM  05/26/2024

## 2024-05-26 NOTE — Progress Notes (Signed)
 Dr. Rosemarie made aware that the patient is very agitated, pulling at all devices, and saying she just wants to be with jesus. Patient is alert and oriented. Palliative consult has been ordered.

## 2024-05-26 NOTE — TOC CAGE-AID Note (Signed)
 Transition of Care [facility]) - CAGE-AID Screening   Patient Details  Name: Donna Petty MRN: [id number] Date of Birth: 11-22-1935  [facility] CM/SW Contact:    Bralynn Donado E Tobenna Needs, LCSW Phone Number: 05/26/2024, 9:27 AM   Clinical Narrative: No SA noted.   CAGE-AID Screening:    Have You Ever Felt You Ought to Cut Down on Your Drinking or Drug Use?: No Have People Annoyed You By Critizing Your Drinking Or Drug Use?: No Have You Felt Bad Or Guilty About Your Drinking Or Drug Use?: No Have You Ever Had a Drink or Used Drugs First Thing In The Morning to Steady Your Nerves or to Get Rid of a Hangover?: No CAGE-AID Score: 0  Substance Abuse Education Offered: No

## 2024-05-26 NOTE — Progress Notes (Signed)
 Speech Language Pathology Treatment: Cognitive-Linguistic  Patient Details Name: Donna Petty MRN: [id number] DOB: 1935-12-13 Today's Date: 05/26/2024 Time: 8285-8262 SLP Time Calculation (min) (ACUTE ONLY): 23 min  Assessment / Plan / Recommendation Clinical Impression  Patient was alert and awake. Her daughter, Leonette, and her partner, Lytle, were at bedside. Daughter shared with SLP that patient is not eating the puree foods. She asked if this could be changed, SLP advanced patient to a dys 2 minced to encourage more PO intake. Patient continued to have right gaze preference. SLP used tactile objects and placed them on the patients left side of tray. SLP asked patient to locate and pick up certain objects with her left side. On three occassions patient told SLP, I don't see it. SLP cued patient to turn her head to the left and then patient was able to locate object independently. Patient could pick up objects with her left hand. This is noticiable improvement from yesterday's session. Patient continuing to improve. SLP will continue to follow.   HPI HPI: Patient is an 88 y.o. female with PMH: dementia, macular degeneration, hypothyroidism, CAD, depression. She lives with her daughter at baseline. She presented to the hospital on 05/24/24 after sudden onset left sided symptoms, leaning to the left and had right gaze deviation. In ED, she was hypertensive with left hemianopsia with left sided extinction. CT negative for large territory infarct or hemorrhage. MRI brain showed large left right PCA infarct, additional scattered small acute bilateral cerebral and cerebellar infarcts, moderate cerebral atrophy. TNK administered but unable to do thrombectomy.      SLP Plan  Continue with current plan of care          Recommendations  Diet recommendations: Dysphagia 2 (fine chop);Thin liquid Liquids provided via: Straw;Cup Medication Administration: Other (Comment) Supervision: Full  supervision/cueing for compensatory strategies;Staff to assist with self feeding Compensations: Slow rate;Small sips/bites Postural Changes and/or Swallow Maneuvers: Seated upright 90 degrees;Upright 30-60 min after meal                  Oral care BID;Staff/trained caregiver to provide oral care     Dysphagia, unspecified (R13.10);Cognitive communication deficit (R41.841)     Continue with current plan of care     Damien Hy  Graduate SLP Clinican

## 2024-05-27 ENCOUNTER — Telehealth (HOSPITAL_COMMUNITY): Payer: Self-pay | Admitting: Pharmacy Technician

## 2024-05-27 ENCOUNTER — Other Ambulatory Visit (HOSPITAL_COMMUNITY): Payer: Self-pay

## 2024-05-27 DIAGNOSIS — Z7189 Other specified counseling: Secondary | ICD-10-CM | POA: Diagnosis not present

## 2024-05-27 DIAGNOSIS — E785 Hyperlipidemia, unspecified: Secondary | ICD-10-CM | POA: Diagnosis not present

## 2024-05-27 DIAGNOSIS — Z515 Encounter for palliative care: Secondary | ICD-10-CM | POA: Diagnosis not present

## 2024-05-27 DIAGNOSIS — F419 Anxiety disorder, unspecified: Secondary | ICD-10-CM

## 2024-05-27 DIAGNOSIS — I63531 Cerebral infarction due to unspecified occlusion or stenosis of right posterior cerebral artery: Secondary | ICD-10-CM | POA: Diagnosis not present

## 2024-05-27 DIAGNOSIS — I10 Essential (primary) hypertension: Secondary | ICD-10-CM | POA: Diagnosis not present

## 2024-05-27 DIAGNOSIS — I739 Peripheral vascular disease, unspecified: Secondary | ICD-10-CM | POA: Diagnosis not present

## 2024-05-27 DIAGNOSIS — R29708 NIHSS score 8: Secondary | ICD-10-CM | POA: Diagnosis not present

## 2024-05-27 DIAGNOSIS — I63431 Cerebral infarction due to embolism of right posterior cerebral artery: Secondary | ICD-10-CM | POA: Diagnosis not present

## 2024-05-27 DIAGNOSIS — I4892 Unspecified atrial flutter: Secondary | ICD-10-CM | POA: Diagnosis not present

## 2024-05-27 DIAGNOSIS — R531 Weakness: Secondary | ICD-10-CM

## 2024-05-27 LAB — GLUCOSE, CAPILLARY: Glucose-Capillary: 142 mg/dL — ABNORMAL HIGH (ref 70–99)

## 2024-05-27 LAB — TSH: TSH: 1.069 u[IU]/mL (ref 0.350–4.500)

## 2024-05-27 MED ORDER — ALPRAZOLAM 0.25 MG PO TABS
0.2500 mg | ORAL_TABLET | Freq: Two times a day (BID) | ORAL | Status: DC | PRN
  Administered 2024-05-27 – 2024-06-01 (×7): 0.25 mg via ORAL
  Filled 2024-05-27 (×8): qty 1

## 2024-05-27 MED ORDER — ATORVASTATIN CALCIUM 10 MG PO TABS
20.0000 mg | ORAL_TABLET | Freq: Every day | ORAL | Status: DC
  Administered 2024-05-28 – 2024-06-02 (×6): 20 mg via ORAL
  Filled 2024-05-27 (×6): qty 2

## 2024-05-27 NOTE — Progress Notes (Signed)
 Physical Therapy Treatment Patient Details Name: Donna Petty MRN: [id number] DOB: [birth year] Today's Date: 05/27/2024   History of Present Illness Donna Petty is a 88 y.o. female who was at home with her daughter when she had sudden onset left-sided symptoms 10/21. 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 MRI with large acute right PCA infarct and additional scattered small acute bilateral cerebral and cerebellar infarcts. PMH: hypothyroidism, macular degeneration, carotid artery disease, depression    PT Comments  The pt is making gradual progress. She needed cues to attend to her L extremities, but was able to utilize them to assist her with functional mobility. She was able to progress to only needing minA to transition supine to sit EOB and modA to transfer to stand from low surfaces while using the stedy. She only needed CGA-minA to transfer to stand from elevated platforms, like the stedy flaps. She does tend to stand with her trunk flexed and forearms resting on the stedy bar, potentially due to chronic back pain. She remains at risk for falls. She is making quick progress and could benefit from inpatient rehab, > 3 hours/day. Will continue to follow acutely.     If plan is discharge home, recommend the following: A little help with bathing/dressing/bathroom;Supervision due to cognitive status;Help with stairs or ramp for entrance;Assist for transportation;Assistance with cooking/housework;Two people to help with walking and/or transfers;Direct supervision/assist for medications management;Direct supervision/assist for financial management   Can travel by private vehicle        Equipment Recommendations   (TBD at next venue)    Recommendations for Other Services Rehab consult     Precautions / Restrictions Precautions Precautions: Fall Recall of Precautions/Restrictions: Impaired Precaution/Restrictions Comments: L neglect, SBP  <180 Restrictions Weight Bearing Restrictions Per Provider Order: No     Mobility  Bed Mobility Overal bed mobility: Needs Assistance Bed Mobility: Supine to Sit     Supine to sit: Min assist, HOB elevated, Used rails     General bed mobility comments: Pt holding onto R rail with R UE. Cues provided to bring bil legs off R EOB with pt initiating well but needing minA at L leg to complete. MinA also needed to scoot hips anteriorly towards EOB with pt ascending her trunk with her R UE    Transfers Overall transfer level: Needs assistance Equipment used: Ambulation equipment used Transfers: Sit to/from Stand, Bed to chair/wheelchair/BSC Sit to Stand: Contact guard assist, Min assist, Mod assist           General transfer comment: Herby utilized for all transfers this date. Cued pt to pull up on stedy bar and extend hips, but pt maintains a flexed posture often resting her forearms on the stedy bar instead, potentially due to chronic back pain and curvature. ModA needed to power up to stand 1x from EOB and 1x from recliner. MinA progressing to CGA when standing from stedy flaps 5x. Stedy utilized to transfer pt OOB to Pharmacist, hospital: Stedy  Ambulation/Gait               General Gait Details: unable   Comptroller Bed    Modified Rankin (Stroke Patients Only) Modified Rankin (Stroke Patients Only) Pre-Morbid Rankin Score: Slight disability Modified Rankin: Severe disability     Balance Overall balance assessment: Needs assistance Sitting-balance support: Single  extremity supported, Feet supported Sitting balance-Leahy Scale: Poor     Standing balance support: Bilateral upper extremity supported, During functional activity, Reliant on assistive device for balance Standing balance-Leahy Scale: Poor Standing balance comment: Flexed posture, modA progressing to CGA to stand in stedy                             Communication Communication Communication: Impaired Factors Affecting Communication: Difficulty expressing self;Reduced clarity of speech  Cognition Arousal: Alert Behavior During Therapy: Flat affect   PT - Cognitive impairments: Initiation, Sequencing, Attention, Problem solving, Memory, Awareness, Safety/Judgement                       PT - Cognition Comments: Pt with L Inattention. Needs multi-modal cues to sequence mobility, delayed process noted. Following commands: Impaired Following commands impaired: Follows one step commands inconsistently (consistent with R UE and LE, needs tactile cues to follow with L UE and LE)    Cueing Cueing Techniques: Verbal cues, Gestural cues, Tactile cues, Visual cues  Exercises Other Exercises Other Exercises: x5 serial sit <> stand in stedy. MinA-CGA    General Comments        Pertinent Vitals/Pain Pain Assessment Pain Assessment: Faces Faces Pain Scale: Hurts little more Pain Location: back (chronic) Pain Descriptors / Indicators: Discomfort, Grimacing, Guarding, Sore Pain Intervention(s): Limited activity within patient's tolerance, Monitored during session, Repositioned    Home Living                          Prior Function            PT Goals (current goals can now be found in the care plan section) Acute Rehab PT Goals Patient Stated Goal: home PT Goal Formulation: With patient/family Time For Goal Achievement: 06/08/24 Potential to Achieve Goals: Good Progress towards PT goals: Progressing toward goals    Frequency    Min 4X/week      PT Plan      Co-evaluation              AM-PAC PT 6 Clicks Mobility   Outcome Measure  Help needed turning from your back to your side while in a flat bed without using bedrails?: A Little Help needed moving from lying on your back to sitting on the side of a flat bed without using bedrails?: A Little Help needed moving to and  from a bed to a chair (including a wheelchair)?: Total Help needed standing up from a chair using your arms (e.g., wheelchair or bedside chair)?: A Lot Help needed to walk in hospital room?: Total Help needed climbing 3-5 steps with a railing? : Total 6 Click Score: 11    End of Session   Activity Tolerance: Patient tolerated treatment well Patient left: with call bell/phone within reach;with nursing/sitter in room;in chair;with chair alarm set;with family/visitor present Nurse Communication: Mobility status;Need for lift equipment (stedy) PT Visit Diagnosis: Unsteadiness on feet (R26.81);Muscle weakness (generalized) (M62.81);Difficulty in walking, not elsewhere classified (R26.2);Other symptoms and signs involving the nervous system (R29.898)     Time: 8865-8848 PT Time Calculation (min) (ACUTE ONLY): 17 min  Charges:    $Therapeutic Activity: 8-22 mins PT General Charges $$ ACUTE PT VISIT: 1 Visit                     Theo Ferretti, PT, DPT Acute Rehabilitation Services  Office: [phone]    Theo CHRISTELLA Ferretti 05/27/2024, 3:30 PM

## 2024-05-27 NOTE — Progress Notes (Signed)
 Progress Note  Patient Name: MIQUELA COSTABILE Date of Encounter: 05/27/2024  Primary Cardiologist:   Lynwood Schilling, MD   Subjective   She is confused but denies chest pain or SOB.   Lying flat in bed  Inpatient Medications    Scheduled Meds:  amLODipine  5 mg Oral Daily   aspirin   81 mg Oral Daily   atorvastatin  40 mg Oral Daily   Chlorhexidine  Gluconate Cloth  6 each Topical Daily   clopidogrel  75 mg Oral Daily   heparin injection (subcutaneous)  5,000 Units Subcutaneous Q8H   levothyroxine   100 mcg Oral Q0600   memantine   5 mg Oral BID   mirtazapine   15 mg Oral QHS   pantoprazole   40 mg Oral QHS   polyethylene glycol  17 g Oral BID   Continuous Infusions:  PRN Meds: acetaminophen  **OR** acetaminophen  (TYLENOL ) oral liquid 160 mg/5 mL **OR** acetaminophen , labetalol, methocarbamol , senna-docusate   Vital Signs    Vitals:   05/26/24 1511 05/26/24 1943 05/27/24 0032 05/27/24 0359  BP: (!) 144/92 (!) 133/111 (!) 144/70 (!) 131/115  Pulse: 78 (!) 133 66 83  Resp: 19 18 (!) 27 18  Temp: 98.8 F (37.1 C)  98.2 F (36.8 C) 99.6 F (37.6 C)  TempSrc: Oral  Oral Axillary  SpO2: 96% 100% 100% 100%  Weight:        Intake/Output Summary (Last 24 hours) at 05/27/2024 0827 Last data filed at 05/26/2024 1750 Gross per 24 hour  Intake --  Output 400 ml  Net -400 ml   Filed Weights   05/24/24 1000  Weight: 77.4 kg    Telemetry    Atrial fib with controlled ventricular rate - Personally Reviewed  ECG    NA - Personally Reviewed  Physical Exam   GEN: No acute distress.   Neck: No  JVD Cardiac: Irregular RR, no murmurs, rubs, or gallops.  Respiratory: Clear  to auscultation bilaterally. GI: Soft, nontender, non-distended  MS: No edema; No deformity.   Labs    Chemistry Recent Labs  Lab 05/24/24 1056 05/24/24 1058 05/26/24 0755  NA 142 143 142  K 3.7 3.7 3.8  CL 108 105 106  CO2 27  --  28  GLUCOSE 93 90 100*  BUN 11 12 9   CREATININE 1.28*  1.40* 1.05*  CALCIUM  9.0  --  8.7*  PROT 6.2*  --   --   ALBUMIN 3.3*  --   --   AST 20  --   --   ALT 17  --   --   ALKPHOS 91  --   --   BILITOT 0.8  --   --   GFRNONAA 41*  --  52*  ANIONGAP 7  --  8     Hematology Recent Labs  Lab 05/24/24 1056 05/24/24 1058  WBC 6.1  --   RBC 4.38  --   HGB 13.2 13.6  HCT 40.1 40.0  MCV 91.6  --   MCH 30.1  --   MCHC 32.9  --   RDW 14.8  --   PLT 294  --     Cardiac EnzymesNo results for input(s): TROPONINI in the last 168 hours. No results for input(s): TROPIPOC in the last 168 hours.   BNPNo results for input(s): BNP, PROBNP in the last 168 hours.   DDimer No results for input(s): DDIMER in the last 168 hours.   Radiology    ECHOCARDIOGRAM COMPLETE Result Date:  05/25/2024    ECHOCARDIOGRAM REPORT   Patient Name:   KEMESHA MOSEY Armentrout Date of Exam: 05/25/2024 Medical Rec #:  [id number]     Height:       61.5 in Accession #:    [id number]    Weight:       170.6 lb Date of Birth:  June 09, 1936     BSA:          1.776 m Patient Age:    88 years      BP:           140/97 mmHg Patient Gender: F             HR:           84 bpm. Exam Location:  Inpatient Procedure: 2D Echo (Both Spectral and Color Flow Doppler were utilized during            procedure). Indications:   Stroke  History:       Patient has no prior history of Echocardiogram examinations.                Stroke.  Sonographer:   Norleen Amour Referring      [id number] [facility] Phys: IMPRESSIONS  1. Left ventricular ejection fraction, by estimation, is 60 to 65%. The left ventricle has normal function. The left ventricle has no regional wall motion abnormalities. There is mild asymmetric left ventricular hypertrophy of the basal-septal segment. Left ventricular diastolic parameters were normal.  2. Right ventricular systolic function is normal. The right ventricular size is normal. There is normal pulmonary artery systolic pressure.  3. The mitral valve is normal in structure.  Mild mitral valve regurgitation. No evidence of mitral stenosis.  4. The aortic valve is grossly normal. There is mild calcification of the aortic valve. Aortic valve regurgitation is not visualized. Aortic valve sclerosis is present, with no evidence of aortic valve stenosis.  5. The inferior vena cava is normal in size with greater than 50% respiratory variability, suggesting right atrial pressure of 3 mmHg. Comparison(s): No prior Echocardiogram. Conclusion(s)/Recommendation(s): Otherwise normal echocardiogram, with minor abnormalities described in the report. No intracardiac source of embolism detected on this transthoracic study. Consider a transesophageal echocardiogram to exclude cardiac source of embolism if clinically indicated. FINDINGS  Left Ventricle: Left ventricular ejection fraction, by estimation, is 60 to 65%. The left ventricle has normal function. The left ventricle has no regional wall motion abnormalities. The left ventricular internal cavity size was normal in size. There is  mild asymmetric left ventricular hypertrophy of the basal-septal segment. Left ventricular diastolic parameters were normal. Right Ventricle: The right ventricular size is normal. Right vetricular wall thickness was not well visualized. Right ventricular systolic function is normal. There is normal pulmonary artery systolic pressure. The tricuspid regurgitant velocity is 2.68 m/s, and with an assumed right atrial pressure of 3 mmHg, the estimated right ventricular systolic pressure is 31.7 mmHg. Left Atrium: Left atrial size was normal in size. Right Atrium: Right atrial size was normal in size. Pericardium: There is no evidence of pericardial effusion. Mitral Valve: The mitral valve is normal in structure. Mild mitral valve regurgitation. No evidence of mitral valve stenosis. Tricuspid Valve: The tricuspid valve is grossly normal. Tricuspid valve regurgitation is trivial. No evidence of tricuspid stenosis. Aortic Valve:  The aortic valve is grossly normal. There is mild calcification of the aortic valve. Aortic valve regurgitation is not visualized. Aortic valve sclerosis is present, with no evidence of aortic valve stenosis. Pulmonic  Valve: The pulmonic valve was not well visualized. Pulmonic valve regurgitation is trivial. No evidence of pulmonic stenosis. Aorta: The aortic root and ascending aorta are structurally normal, with no evidence of dilitation. Venous: The inferior vena cava is normal in size with greater than 50% respiratory variability, suggesting right atrial pressure of 3 mmHg. IAS/Shunts: The atrial septum is grossly normal.  LEFT VENTRICLE PLAX 2D LVIDd:         4.40 cm     Diastology LVIDs:         2.50 cm     LV e' medial:    6.74 cm/s LV PW:         1.00 cm     LV E/e' medial:  14.2 LV IVS:        1.30 cm     LV e' lateral:   10.80 cm/s LVOT diam:     1.80 cm     LV E/e' lateral: 8.8 LV SV:         32 LV SV Index:   18 LVOT Area:     2.54 cm LV IVRT:       74 msec  LV Volumes (MOD) LV vol d, MOD A2C: 56.8 ml LV vol d, MOD A4C: 70.6 ml LV vol s, MOD A2C: 20.3 ml LV vol s, MOD A4C: 29.6 ml LV SV MOD A2C:     36.5 ml LV SV MOD A4C:     70.6 ml LV SV MOD BP:      43.9 ml RIGHT VENTRICLE             IVC RV Basal diam:  3.90 cm     IVC diam: 1.70 cm RV S prime:     11.40 cm/s TAPSE (M-mode): 2.0 cm LEFT ATRIUM             Index        RIGHT ATRIUM           Index LA diam:        4.00 cm 2.25 cm/m   RA Area:     15.00 cm LA Vol (A2C):   46.9 ml 26.40 ml/m  RA Volume:   37.10 ml  20.89 ml/m LA Vol (A4C):   45.7 ml 25.73 ml/m LA Biplane Vol: 47.5 ml 26.74 ml/m  AORTIC VALVE             PULMONIC VALVE LVOT Vmax:   60.00 cm/s  PV Vmax:       0.72 m/s LVOT Vmean:  42.300 cm/s PV Peak grad:  2.1 mmHg LVOT VTI:    0.127 m  AORTA Ao Root diam: 2.80 cm Ao Asc diam:  3.20 cm MITRAL VALVE               TRICUSPID VALVE MV Area (PHT): 3.74 cm    TR Peak grad:   28.7 mmHg MV Decel Time: 203 msec    TR Vmax:        268.00  cm/s MV E velocity: 95.40 cm/s MV A velocity: 63.40 cm/s  SHUNTS MV E/A ratio:  1.50        Systemic VTI:  0.13 m                            Systemic Diam: 1.80 cm Shelda Bruckner MD Electronically signed by Shelda Bruckner MD Signature Date/Time: 05/25/2024/6:04:15 PM    Final    MR BRAIN WO CONTRAST  Result Date: 05/25/2024 EXAM: MRI BRAIN WITHOUT CONTRAST 05/25/2024 11:22:00 AM TECHNIQUE: Multiplanar multisequence MRI of the head/brain was performed without the administration of intravenous contrast. COMPARISON: CTA head and neck 05/25/2024. CLINICAL HISTORY: Acute neuro deficit, stroke suspected; 24 hours post tnkase. FINDINGS: LIMITATIONS: The examination is mildly to moderately motion degraded. BRAIN AND VENTRICLES: There is a large acute right PCA infarct involving the right temporal and occipital lobes as well as right thalamus with cytotoxic edema but no significant associated mass effect or hemorrhage. Additional scattered small acute infarcts are present in both cerebellar hemispheres, left temporal and occipital lobes, both parietal lobes, and posterior right frontal lobe as well as along the right caudate body. There are small chronic infarcts involving the right basal ganglia and both cerebellar hemispheres. Small chronic hemorrhages are present in the cerebellum and midbrain. Mild T2 hyperintensities elsewhere in the cerebral white matter bilaterally are nonspecific but compatible with chronic small vessel ischemic disease. There is moderate cerebral atrophy. No mass, midline shift, hydrocephalus, or extra axial fluid collection is identified. Abnormal signal is noted along the course of the proximal right PCA corresponding to the occlusion shown on CTA. ORBITS: Bilateral cataract extraction. SINUSES AND MASTOIDS: Small right mastoid effusion. Clear paranasal sinuses. BONES AND SOFT TISSUES: Normal marrow signal. No acute soft tissue abnormality. IMPRESSION: 1. Large acute right PCA  infarct. 2. Additional scattered small acute bilateral cerebral and cerebellar infarcts. 3. Moderate cerebral atrophy and mild chronic small vessel ischemic disease with chronic lacunar infarcts as above. Electronically signed by: Dasie Hamburg MD 05/25/2024 11:43 AM EDT RP Workstation: [id number]    Cardiac Studies   Echo see above.   Patient Profile     88 y.o. female  with a hx of hypothyroidism, carotid artery disease, GERD who is being seen 05/26/2024 for the evaluation of atrial flutter at the request of Dr. Rosemarie.   Assessment & Plan    Atrial fib:  Eliquis when OK with neurology.  Rate is controlled.  No change in therapy.  Not planning rhythm control.  OK with rate control and anticoagulation  CVA:  Currently on ASA and Plavix.  Transition as above when able.   Dyslipidemia:    Continue statin.    HTN:   Some permissive HTN.   Currently SBP less than 180.  On Norvasc 5 mg daily.   For questions or updates, please contact [facility] Please consult [email or web address] for contact info under Cardiology/STEMI.   Signed, Lynwood Schilling, MD  05/27/2024, 8:27 AM

## 2024-05-27 NOTE — TOC Progression Note (Addendum)
 Transition of Care [facility]) - Progression Note    Patient Details  Name: Donna Petty MRN: [id number] Date of Birth: 11/25/1935  Transition of [facility] CM/SW Contact  Graves-Bigelow, Erminio Deems, RN Phone Number: 05/27/2024, 11:07 AM  Clinical Narrative:  Inpatient Case Manager received a consult for Eliquis. Benefits check submitted and cost is $47.00. ICM will continue to follow for additional needs as the patient progresses.   Expected Discharge Plan: IP Rehab Facility Barriers to Discharge: Continued Medical Work up    Living arrangements for the past 2 months: Single Family Home  Social Drivers of Health (SDOH) Interventions SDOH Screenings   Food Insecurity: No Food Insecurity (05/25/2024)  Housing: Low Risk  (05/25/2024)  Transportation Needs: No Transportation Needs (05/25/2024)  Utilities: Not At Risk (05/25/2024)  Social Connections: Moderately Integrated (05/25/2024)  Tobacco Use: Low Risk  (01/26/2024)    Readmission Risk Interventions     No data to display

## 2024-05-27 NOTE — Progress Notes (Signed)
 STROKE TEAM PROGRESS NOTE    SIGNIFICANT HOSPITAL EVENTS 10/21: Patient admitted with left-sided weakness and right gaze deviation, mechanical thrombectomy attempted for right PCA P1 occlusion but unsuccessful due to extreme vertebral artery tortuosity 10/22- Transfer out of ICU 10/23: - Atrial flutter on monitor, cardiology consulted. - Okay with anticoagulation per cardiology.  Not planning rhythm control.  INTERIM HISTORY/SUBJECTIVE Neuro exam is stable with right gaze preference, left field cut, mild left hemiparesis.  Daughter at bedside.  Patient up on bedside recliner today. CIR pending.  Palliative NP at bedside for goals of care conversation. Follow up CT tomorrow AM, consider [facility] with stable imaging (cardiology okay with [facility])  OBJECTIVE CBC    Component Value Date/Time   WBC 6.1 05/24/2024 1056   RBC 4.38 05/24/2024 1056   HGB 13.6 05/24/2024 1058   HCT 40.0 05/24/2024 1058   PLT 294 05/24/2024 1056   MCV 91.6 05/24/2024 1056   MCH 30.1 05/24/2024 1056   MCHC 32.9 05/24/2024 1056   RDW 14.8 05/24/2024 1056   LYMPHSABS 2.2 05/24/2024 1056   MONOABS 0.7 05/24/2024 1056   EOSABS 0.2 05/24/2024 1056   BASOSABS 0.0 05/24/2024 1056  BMET    Component Value Date/Time   NA 142 05/26/2024 0755   K 3.8 05/26/2024 0755   CL 106 05/26/2024 0755   CO2 28 05/26/2024 0755   GLUCOSE 100 (H) 05/26/2024 0755   BUN 9 05/26/2024 0755   CREATININE 1.05 (H) 05/26/2024 0755   CALCIUM  8.7 (L) 05/26/2024 0755   GFRNONAA 52 (L) 05/26/2024 0755   IMAGING past 24 hours No results found.  Vitals:   05/26/24 1511 05/26/24 1943 05/27/24 0032 05/27/24 0359  BP: (!) 144/92 (!) 133/111 (!) 144/70 (!) 131/115  Pulse: 78 (!) 133 66 83  Resp: 19 18 (!) 27 18  Temp: 98.8 F (37.1 C)  98.2 F (36.8 C) 99.6 F (37.6 C)  TempSrc: Oral  Oral Axillary  SpO2: 96% 100% 100% 100%  Weight:       PHYSICAL EXAM General: Ill-appearing elderly patient in no acute distress Psych:  Mood and affect  appropriate for situation. CV: Regular rate and rhythm on monitor Respiratory:  Regular, unlabored respirations on room air   NEURO:  Mental Status: Patient is drowsy.  Patient responds to name and is able to state correct age and month Speech/Language: speech is minimal with mild dysarthria but without aphasia  Cranial Nerves:  II: PERRL.  Left hemianopsia III, IV, VI: Right gaze deviation, unable to cross midline V: Sensation is intact to light touch and diminished on the left VII: Face is symmetrical resting and smiling VIII: Hearing intact to voice. IX, X: Voice is mildly dysarthric KP:Dynloizm shrug 5/5. XII: Tongue protrudes midline Motor: Able to move all 4 extremities with antigravity strength, but drift noted to the left arm Tone: is normal and bulk is normal Sensation- Intact to light touch on the right but absent in the left arm and diminished in the left leg Coordination: FTN intact on the right Gait- deferred  Most Recent NIH  1a Level of Conscious.: 0 1b LOC Questions: 0 1c LOC Commands: 0 2 Best Gaze: 2 3 Visual: 2 4 Facial Palsy: 0 5a Motor Arm - left: 2 5b Motor Arm - Right: 0 6a Motor Leg - Left: 0 6b Motor Leg - Right: 0 7 Limb Ataxia: 0 8 Sensory: 1 9 Best Language: 0 10 Dysarthria: 1 11 Extinct. and Inatten.: 0 TOTAL: 8  ASSESSMENT/PLAN Ms. Donna  DANYLA Petty is a 88 y.o. female with history of hypothyroidism, macular degeneration and depression admitted for left hemianopsia, right gaze deviation and left hemiparesis.  She was found to have a right PCA P1 occlusion, and mechanical thrombectomy was attempted.  Unfortunately, this procedure was unsuccessful due to extreme tortuosity of the vertebral artery. MRI reveals large acute right PCA infarct and a conditional scattered small bilateral acute cerebral and cerebellar infarcts. NIH on Admission 7  Acute Ischemic Infarct: Right PCA territory stroke with multiple small scattered infarcts in bilateral  hemispheres Etiology:   cardioembolic from new onset atrial flutter as  code Stroke CT head No acute abnormality. Small vessel disease. ASPECTS 10.    CTA head & neck right PCA P1 segment occlusion, right ICA origin plaque with 60% stenosis, bilateral cervical ICA tortuosity and possible FMD, moderate left ICA siphon stenosis, mild right M2 branch stenoses MRI large acute right PCA infarct, additional scattered small acute bilateral cerebral and cerebellar infarcts, moderate cerebral atrophy and chronic small vessel ischemic disease 2D Echo 60-65%, LA normal  LDL 95 HgbA1c 5.4 VTE prophylaxis -SQ heparin No antithrombotic prior to admission, now on aspirin  81 mg daily and clopidogrel 75 mg daily.  Consider AC after a.m. CTH if stable.  Therapy recommendations: CIR Disposition: Pending  A- flutter Cardiology consulted Consider anticoagulation in 3 to 5 days given size of stroke EKG Replace K   Hypertension Home meds: None Stable Keep systolic blood pressure less than 180  Hyperlipidemia Home meds: None LDL 95, goal < 70 Add atorvastatin 40 mg daily Continue statin at discharge  Dysphagia Patient has post-stroke dysphagia, SLP consulted    Diet   DIET DYS 2 Room service appropriate? Yes; Fluid consistency: Thin  Consider cortrack if family in agreement Palliative care on board for GOC discussions Advance diet as tolerated  Other Stroke Risk Factors Obesity, Body mass index is 31.72 kg/m., BMI >/= 30 associated with increased stroke risk, recommend weight loss, diet and exercise as appropriate   Other Active Problems Anxiety Started Xanax 0.25 mg tabled BID PRN for anxiety   Hospital day # 3  Patient seen and examined by NP/APP with MD. MD to update note as needed.   Stevi Toberman, AGACNP-BC [facility] Pager: [phone] I have personally obtained history,examined this patient, reviewed notes, independently viewed imaging studies, participated in  medical decision making and plan of care.ROS completed by me personally and pertinent positives fully documented  I have made any additions or clarifications directly to the above note. Agree with note above.  Patient sitting up in bedside chair today.  She is more alert and interactive.  She does get anxious and needs some Xanax.  Plan to repeat CT head tomorrow and may consider switching to Eliquis and stopping aspirin  if stable.  On discussion with patient and family member at the bedside and answered questions.  Appreciate cardiology help   I personally spent a total of 35 minutes in the care of the patient today including getting/reviewing separately obtained history, performing a medically appropriate exam/evaluation, counseling and educating, placing orders, referring and communicating with other health care professionals, documenting clinical information in the EHR, independently interpreting results, and coordinating care.         Eather Popp, MD Medical Director [facility] Stroke Center Pager: [phone] 05/27/2024 3:23 PM  To contact Stroke Continuity provider, please refer to [email or web address]. After hours, contact General Neurology

## 2024-05-27 NOTE — Care Management Important Message (Signed)
 Important Message  Patient Details  Name: MONTEEN TOOPS MRN: [id number] Date of Birth: 07/14/1936   Important Message Given:  Yes - Medicare IM     Claretta Deed 05/27/2024, 1:50 PM

## 2024-05-27 NOTE — Telephone Encounter (Signed)
 Patient Product/process development scientist completed.    The patient is insured through [employer]/ [employer]. Patient has Medicare and is not eligible for a copay card, but may be able to apply for patient assistance or Medicare RX Payment Plan (Patient Must reach out to their plan, if eligible for payment plan), if available.    Ran test claim for Eliquis  5 mg and the current 30 day co-pay is $47.00.   This test claim was processed through [facility]- copay amounts may vary at other pharmacies due to pharmacy/plan contracts, or as the patient moves through the different stages of their insurance plan.     Reyes Sharps, CPHT Pharmacy Technician Patient Advocate Specialist Lead [facility] Patient Advocate Team Direct Number: [phone]  Fax: [phone]

## 2024-05-27 NOTE — Consult Note (Signed)
 Consultation Note Date: 05/27/2024   Patient Name: Donna Petty  DOB: March 10, 1936  MRN: [id number]  Age / Sex: 88 y.o., female  PCP: Donna Pagan, NP Referring Physician: Stroke, Md, MD  Reason for Consultation: Establishing goals of care  HPI/Patient Profile: 88 y.o. female  with past medical history of hypothyroidism, dementia, carotid artery disease, GERD  admitted on 05/24/2024 with left sided weakness. Code stroke was activated, MRI showed a large right acute PCA infarct and conditional scattered small bilateral acute cerebral and cerebellar infarcts. mechanical thrombectomy attempted for right PCA P1 occlusion but unsuccessful due to extreme vertebral artery tortuosity  Patient was manifesting right sided gaze with left hemianopsia and left-sided extinction on arrival to the ED.   Worth to note that she developed new onset atrial flutter the evening of 10/22, currently being seen by Cardiologist.    PMT has been consulted to assist with goals of care conversation. Patient/Family face treatment option decisions, advanced directive decisions and anticipatory care needs.   Family face treatment option decision, advance directive decisions and anticipatory care needs.   Clinical Assessment and Goals of Care:  I have reviewed medical records including EPIC notes, labs and imaging, , assessed the patient and then met with patient and patient's daughter Donna Petty to discuss diagnosis prognosis, GOC, EOL wishes, disposition and options.  I introduced Palliative Medicine as specialized medical care for people living with serious illness. It focuses on providing relief from the symptoms and stress of a serious illness. The goal is to improve quality of life for both the patient and the family.  Created space and opportunity for family to explore thoughts and feelings regarding patient's current medical condition.   Visited patient at bedside, the patient was lying in  bed comfortably while being assisted with breakfast by nursing staff. She appeared chronically ill, frail, and visibly fatigued, though she was not in any acute distress. She was alert, able to express her needs clearly, and appropriately answered orientation questions. While she denied any physical pain or discomfort, she expressed emotional distress, stating, "This is torture, I just wanna see Jesus." I acknowledged her concerns and informed her that I would reach out to her daughter, Donna Petty, to arrange an in-person discussion regarding goals of care.  Shortly after, I contacted Donna Petty by phone, and we agreed to meet later that day for a bedside discussion. At approximately 12:00 PM, I met with Donna Petty at the patient's bedside to review the patient's current medical condition, the events leading to her hospitalization, and to discuss GOC. Donna Petty demonstrated a clear understanding of the patient's clinical status. She recounted that last Tuesday, while at home, she observed signs suggestive of an acute stroke, specifically, left-sided weakness and drooling which prompted her to call EMS. She is aware that the patient suffered an acute stroke and is currently being treated for it, along with newly diagnosed atrial flutter.  We discussed the complexity of the patient's condition, including her advanced age, acute neurological event, and pre-existing comorbidities, all of which may contribute to a prolonged and unpredictable recovery. Donna Petty acknowledged this and expressed interest in therapies that could help the patient regain strength and return to her prior level of function. I explained that the medical team is actively providing appropriate interventions and evaluating the patient's rehabilitation needs. We briefly discussed the possibility of discharge to a short-term rehabilitation facility such as CIR or a skilled nursing facility if patient improves. Donna Petty expressed concern about her ability to meet the  patient's  physical care needs at home due to her own limitations and emphasized the importance of the patient regaining some level of independence.  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  We explored the patient's goals and values, including preferences for comfort-focused care and maintaining dignity and quality of life. I shared with her the patient's earlier statement about "wanting to see Jesus," which Donna Petty interpreted as a reflection of emotional frustration rather than a definitive expression of end-of-life wishes. Donna Petty remains hopeful that the patient will improve with rehabilitation and eventually return home. She stated, "Let's take it day by day and hope you continue to get better." The patient was present during the conversation, did not contradict the discussion, but became tearful and emotional.  I reviewed the spectrum of care options with Donna Petty, including full aggressive treatment, DNR with limited interventions, and comfort-focused care. I also explained the roles of hospice and outpatient palliative care services. Donna Petty was receptive and expressed appreciation for the discussion. She shared that if the patient's condition worsens or if she continues to express a desire for comfort and peace, the family may consider transitioning to a comfort-focused approach, as this aligns with the patient's values. For now, the plan is to continue appropriate medical interventions in hopes of clinical improvement.  Discussed the importance of continued conversation with family and the medical providers regarding overall plan of care and treatment options, ensuring decisions are within the context of the patient's values and GOCs.   Questions and concerns were addressed. The family was encouraged to call with questions or concerns.  PMT will continue to support  holistically.   Social History: The patient lives with her daughter, Donna Petty, and Donna Petty's boyfriend following the passing of her husband six years ago. 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 She was [place] farm and enjoys listening to [employer] and [employer].  Functional and Nutritional State: At baseline, the patient is highly independent and ambulates with occasional use of a walker. Prior to this hospitalization, her appetite was good.  Palliative Symptoms: Generalized weakness, difficulty swallowing, intermittent confusion/disorientation    Advance Directives: A detailed discussion regarding advanced directives was had. Patient has a current/existing HCPOA and living will document in place.   Code Status: DNR-Limited  Primary Decision Maker: Davie Harder (Daughter) and Aliene Margette Reasoner (relative)   SUMMARY OF RECOMMENDATIONS    Code Status: Maintain DNR-Limited Continue current scope of care Goal of care is medical stabilization and recovery to the extent this is possible Encouraged the daughter to continue discussions with patient and the care team to ensure decisions align with the patient's values and goals. Advised considering comfort-focused care if the patient consistently expresses a desire to forgo aggressive treatment. Continue to provide psycho-social and emotional support to patient and family Palliative medicine team will continue to follow.   Symptom Management: Per Primary team Palliative medicine is available to assist as needed.   Palliative Prophylaxis:  Aspiration, Bowel Regimen, Delirium Protocol, Oral Care, and Turn Reposition  Prognosis:  Patient's prognosis is guarded given advanced age, acute stroke, new onset a flutter and other underlying co morbidities.   Discharge Planning: To Be Determined      Primary Diagnoses: Present on Admission:  Acute ischemic right PCA stroke  [facility])    Physical Exam Vitals and nursing note reviewed.  Constitutional:      Appearance: She is ill-appearing.  HENT:     Head: Normocephalic and atraumatic.  Cardiovascular:     Rate and Rhythm: Normal rate.     Comments: A-flutter, HR 83 Pulmonary:     Effort: Pulmonary effort is normal.  Abdominal:     General: Abdomen is flat.     Palpations: Abdomen is soft.  Genitourinary:    Comments: Purewick in place  Musculoskeletal:     Cervical back: Normal range of motion.     Comments: Generalized weakness more observed on the left side.   Skin:    General: Skin is warm and dry.  Neurological:     Mental Status: She is alert and oriented to person, place, and time.  Psychiatric:     Comments: Irritable.     Vital Signs: BP (!) 131/115 (BP Location: Right Leg)   Pulse 83   Temp 99.6 F (37.6 C) (Axillary)   Resp 18   Wt 77.4 kg   SpO2 100%   BMI 31.72 kg/m  Pain Scale: Faces   Pain Score: 0-No pain   SpO2: SpO2: 100 % O2 Device:SpO2: 100 % O2 Flow Rate: .O2 Flow Rate (L/min): 2 L/min   Palliative Assessment/Data: 50%    Total time: I spent 90 minutes in the care of the patient today in the above activities and documenting the encounter.   Detailed review of medical records (labs, imaging, vital signs), medically appropriate exam, discussed with treatment team, counseling and education to patient, family, & staff, documenting clinical information, coordination of care.     Kathlyne JULIANNA Tracie Mickey, NP  Palliative Medicine Team Team phone # [phone]  Thank you for allowing the Palliative Medicine Team to assist in the care of this patient. Please utilize secure chat with additional questions, if there is no response within 30 minutes please call the above phone number.  Palliative Medicine Team providers are available by phone from 7am to 7pm daily and can be reached through the team cell phone.  Should this patient require assistance outside of  these hours, please call the patient's attending physician.

## 2024-05-27 NOTE — Progress Notes (Signed)
   Inpatient Rehabilitation Admissions Coordinator   I will place rehab consult to assess for candidacy for CIR admit.  Heron Leavell, RN, MSN Rehab Admissions Coordinator [phone] 05/27/2024 4:26 PM

## 2024-05-28 DIAGNOSIS — Z7189 Other specified counseling: Secondary | ICD-10-CM | POA: Diagnosis not present

## 2024-05-28 DIAGNOSIS — I4819 Other persistent atrial fibrillation: Secondary | ICD-10-CM | POA: Diagnosis not present

## 2024-05-28 DIAGNOSIS — Z515 Encounter for palliative care: Secondary | ICD-10-CM | POA: Diagnosis not present

## 2024-05-28 DIAGNOSIS — I4892 Unspecified atrial flutter: Secondary | ICD-10-CM | POA: Diagnosis not present

## 2024-05-28 DIAGNOSIS — R451 Restlessness and agitation: Secondary | ICD-10-CM

## 2024-05-28 DIAGNOSIS — I63431 Cerebral infarction due to embolism of right posterior cerebral artery: Secondary | ICD-10-CM | POA: Diagnosis not present

## 2024-05-28 DIAGNOSIS — I739 Peripheral vascular disease, unspecified: Secondary | ICD-10-CM | POA: Diagnosis not present

## 2024-05-28 DIAGNOSIS — I63531 Cerebral infarction due to unspecified occlusion or stenosis of right posterior cerebral artery: Secondary | ICD-10-CM | POA: Diagnosis not present

## 2024-05-28 DIAGNOSIS — F0150 Vascular dementia without behavioral disturbance: Secondary | ICD-10-CM

## 2024-05-28 DIAGNOSIS — R29708 NIHSS score 8: Secondary | ICD-10-CM | POA: Diagnosis not present

## 2024-05-28 LAB — CBC
HCT: 38.8 % (ref 36.0–46.0)
Hemoglobin: 12.7 g/dL (ref 12.0–15.0)
MCH: 29.7 pg (ref 26.0–34.0)
MCHC: 32.7 g/dL (ref 30.0–36.0)
MCV: 90.9 fL (ref 80.0–100.0)
Platelets: 287 K/uL (ref 150–400)
RBC: 4.27 MIL/uL (ref 3.87–5.11)
RDW: 14.5 % (ref 11.5–15.5)
WBC: 8.6 K/uL (ref 4.0–10.5)
nRBC: 0 % (ref 0.0–0.2)

## 2024-05-28 LAB — BASIC METABOLIC PANEL WITH GFR
Anion gap: 15 (ref 5–15)
BUN: 12 mg/dL (ref 8–23)
CO2: 21 mmol/L — ABNORMAL LOW (ref 22–32)
Calcium: 8.6 mg/dL — ABNORMAL LOW (ref 8.9–10.3)
Chloride: 105 mmol/L (ref 98–111)
Creatinine, Ser: 1.06 mg/dL — ABNORMAL HIGH (ref 0.44–1.00)
GFR, Estimated: 51 mL/min — ABNORMAL LOW (ref >60)
Glucose, Bld: 99 mg/dL (ref 70–99)
Potassium: 3.4 mmol/L — ABNORMAL LOW (ref 3.5–5.1)
Sodium: 141 mmol/L (ref 135–145)

## 2024-05-28 MED ORDER — LORAZEPAM 2 MG/ML IJ SOLN
0.5000 mg | Freq: Once | INTRAMUSCULAR | Status: AC
  Administered 2024-05-28: 0.5 mg via INTRAVENOUS
  Filled 2024-05-28: qty 1

## 2024-05-28 MED ORDER — LORAZEPAM 2 MG/ML IJ SOLN: 1.0000 mg | Freq: Once | INTRAMUSCULAR | Status: DC

## 2024-05-28 MED ORDER — QUETIAPINE FUMARATE 25 MG PO TABS
12.5000 mg | ORAL_TABLET | Freq: Two times a day (BID) | ORAL | Status: DC
  Administered 2024-05-28 – 2024-05-31 (×7): 12.5 mg via ORAL
  Filled 2024-05-28 (×7): qty 1

## 2024-05-28 NOTE — Progress Notes (Addendum)
 STROKE TEAM PROGRESS NOTE    SIGNIFICANT HOSPITAL EVENTS 10/21: Patient admitted with left-sided weakness and right gaze deviation, mechanical thrombectomy attempted for right PCA P1 occlusion but unsuccessful due to extreme vertebral artery tortuosity 10/22- Transfer out of ICU 10/23: - Atrial flutter on monitor, cardiology consulted. - Okay with anticoagulation per cardiology.  Not planning rhythm control.  INTERIM HISTORY/SUBJECTIVE CT Head ordered to determine when to resume anticoagulation. Start seroquel 12.5mg  BID for agitation.  OBJECTIVE CBC    Component Value Date/Time   WBC 8.6 05/28/2024 0522   RBC 4.27 05/28/2024 0522   HGB 12.7 05/28/2024 0522   HCT 38.8 05/28/2024 0522   PLT 287 05/28/2024 0522   MCV 90.9 05/28/2024 0522   MCH 29.7 05/28/2024 0522   MCHC 32.7 05/28/2024 0522   RDW 14.5 05/28/2024 0522   LYMPHSABS 2.2 05/24/2024 1056   MONOABS 0.7 05/24/2024 1056   EOSABS 0.2 05/24/2024 1056   BASOSABS 0.0 05/24/2024 1056  BMET    Component Value Date/Time   NA 141 05/28/2024 0522   K 3.4 (L) 05/28/2024 0522   CL 105 05/28/2024 0522   CO2 21 (L) 05/28/2024 0522   GLUCOSE 99 05/28/2024 0522   BUN 12 05/28/2024 0522   CREATININE 1.06 (H) 05/28/2024 0522   CALCIUM  8.6 (L) 05/28/2024 0522   GFRNONAA 51 (L) 05/28/2024 0522   IMAGING past 24 hours No results found.  Vitals:   05/27/24 1927 05/27/24 2354 05/28/24 0428 05/28/24 1000  BP: (!) 170/85 (!) 157/90 (!) 149/68 (!) 178/96  Pulse:  (!) 101 77 69  Resp: 16   16  Temp: 98.2 F (36.8 C) 98 F (36.7 C) 98.5 F (36.9 C) 99 F (37.2 C)  TempSrc: Oral Axillary Axillary Oral  SpO2: 98% 97% 97% 98%  Weight:       PHYSICAL EXAM General: Ill-appearing elderly patient in no acute distress Psych:  Mood and affect appropriate for situation. CV: Regular rate and rhythm on monitor Respiratory:  Regular, unlabored respirations on room air   NEURO:  Mental Status: Patient is drowsy.  Patient responds  to name and is able to state correct age and month Speech/Language: speech is minimal with mild dysarthria but without aphasia  Cranial Nerves:  II: PERRL.  Left hemianopsia III, IV, VI: Right gaze deviation, unable to cross midline V: Sensation is intact to light touch and diminished on the left VII: Face is symmetrical resting and smiling VIII: Hearing intact to voice. IX, X: Voice is mildly dysarthric KP:Dynloizm shrug 5/5. XII: Tongue protrudes midline Motor: Bilateral UEs 4/5 proximal and distally, no drift. Bilaterally LEs 4/5 proximal and distally  Tone: is normal and bulk is normal Sensation- Intact to light touch on the right but absent in the left arm and diminished in the left leg Coordination: FTN intact on the right Gait- deferred  Most Recent NIH  1a Level of Conscious.: 0 1b LOC Questions: 0 1c LOC Commands: 0 2 Best Gaze: 2 3 Visual: 2 4 Facial Palsy: 0 5a Motor Arm - left: 2 5b Motor Arm - Right: 0 6a Motor Leg - Left: 0 6b Motor Leg - Right: 0 7 Limb Ataxia: 0 8 Sensory: 1 9 Best Language: 0 10 Dysarthria: 1 11 Extinct. and Inatten.: 0 TOTAL: 8  ASSESSMENT/PLAN Donna Petty is a 88 y.o. female with history of hypothyroidism, macular degeneration and depression admitted for left hemianopsia, right gaze deviation and left hemiparesis.  She was found to have a right PCA  P1 occlusion, and mechanical thrombectomy was attempted.  Unfortunately, this procedure was unsuccessful due to extreme tortuosity of the vertebral artery. MRI reveals large acute right PCA infarct and a conditional scattered small bilateral acute cerebral and cerebellar infarcts. NIH on Admission 7  Stroke: Right PCA large infarct with multiple small scattered infarcts in bilateral hemispheres with R P1 occlusion s/p IR but unsuccessful, etiology: cardioembolic from new diagnosed atrial flutter   code Stroke CT head No acute abnormality. Small vessel disease. ASPECTS 10.    CTA head & neck  right PCA P1 segment occlusion, right ICA origin plaque with 60% stenosis, bilateral cervical ICA tortuosity and possible FMD, moderate left ICA siphon stenosis, mild right M2 branch stenoses S/p IR but unsuccessful due to extreme tortuous VA MRI large acute right PCA infarct, additional scattered small acute bilateral cerebral and cerebellar infarcts, moderate cerebral atrophy and chronic small vessel ischemic disease CT repeat pending 2D Echo EF 60-65%, LA normal  LDL 95 HgbA1c 5.4 VTE prophylaxis - SQ heparin No antithrombotic prior to admission, now on aspirin  81 mg daily and clopidogrel 75 mg daily.  Consider AC if CTH if stable.  Therapy recommendations: CIR Disposition: Pending  A- flutter New diagnosis Cardiology consulted Replace K  Consider AC if CTH repeat if stable.   Anxiety and agitation Vascular dementia Started Xanax 0.25 mg tabled BID PRN for anxiety  Start Seroquel 12.5 mg twice daily On b/l hand mittens On home nemanda and Remeron   Hypertension Home meds: None Stable Keep systolic blood pressure less than 180  Hyperlipidemia Home meds: None LDL 95, goal < 70 Add atorvastatin 20 mg daily Continue statin at discharge  Dysphagia Patient has post-stroke dysphagia, SLP  On dysphagia 2 and thin liquid Advance diet as tolerated  Other Stroke Risk Factors Obesity, Body mass index is 31.72 kg/m., BMI >/= 30 associated with increased stroke risk, recommend weight loss, diet and exercise as appropriate   Other Active Problems AKI, Cre 1.40--1.05--1.06  Hospital day # 4  Patient seen and examined by NP/APP with MD. MD to update note as needed.   Jorene Last, DNP, FNP-BC [facility] Pager: [phone]  ATTENDING NOTE: I reviewed above note and agree with the assessment and plan. Pt was seen and examined.   No family at the bedside. Pt lying in bed with b/l hand mittens. She is mildly lethargic but awake, alert, eyes open, orientated to  age, place, time and situation. No aphasia, fluent language, following most simple commands. Able to name and repeat but intermittent agitation. R gaze prefernce, tracking bilaterally with voice, visual field testing showed left hemianopia. No facial droop. Tongue midline. Bilateral UEs 4/5 proximal and distally, no drift. Bilaterally LEs 4/5 proximal and distally. Sensation symmetrical bilaterally, b/l FTN not cooperative, gait not tested.   For detailed assessment and plan, please refer to above as I have made changes wherever appropriate.   Ary Cummins, MD PhD Stroke Neurology 05/28/2024 9:53 PM  Patient continue to have agitation. I had long discussion with RN. I spent extensive total face-to-face time with the patient and family, reviewing test results, images and medication, and discussing the diagnosis, treatment plan and potential prognosis. This patient's care requiresreview of multiple databases, neurological assessment, discussion with family, other specialists and medical decision making of high complexity.      To contact Stroke Continuity provider, please refer to [email or web address]. After hours, contact General Neurology

## 2024-05-28 NOTE — Plan of Care (Signed)
  Problem: Clinical Measurements: Goal: Ability to maintain clinical measurements within normal limits will improve Outcome: Progressing Goal: Will remain free from infection Outcome: Progressing Goal: Diagnostic test results will improve Outcome: Progressing Goal: Respiratory complications will improve Outcome: Progressing Goal: Cardiovascular complication will be avoided Outcome: Progressing   Problem: Elimination: Goal: Will not experience complications related to bowel motility Outcome: Progressing Goal: Will not experience complications related to urinary retention Outcome: Progressing   Problem: Nutrition: Goal: Adequate nutrition will be maintained Outcome: Progressing   Problem: Activity: Goal: Risk for activity intolerance will decrease Outcome: Progressing   Problem: Skin Integrity: Goal: Risk for impaired skin integrity will decrease Outcome: Progressing   Problem: Safety: Goal: Ability to remain free from injury will improve Outcome: Progressing   Problem: Pain Managment: Goal: General experience of comfort will improve and/or be controlled Outcome: Progressing

## 2024-05-28 NOTE — Progress Notes (Signed)
 02:05H - Patient becomes agitated and very aggressive verbally and physically. Pulled cardiac leads multiple times from start of shift despite mitts on.She was trying to hit the nurse when trying to put back her leads. Notified on-call Dr. EMERSON Seals, MD. Ativan 0.5 mg IV ordered.

## 2024-05-28 NOTE — Progress Notes (Signed)
 Palliative Medicine Inpatient Follow Up Note   HPI: 88 y.o. female  with past medical history of hypothyroidism, dementia, carotid artery disease, GERD  admitted on 05/24/2024 with left sided weakness. Code stroke was activated, MRI showed a large right acute PCA infarct and conditional scattered small bilateral acute cerebral and cerebellar infarcts. mechanical thrombectomy attempted for right PCA P1 occlusion but unsuccessful due to extreme vertebral artery tortuosity  Patient was manifesting right sided gaze with left hemianopsia and left-sided extinction on arrival to the ED.    Worth to note that she developed new onset atrial flutter the evening of 10/22, currently being seen by Cardiologist.     PMT has been consulted to assist with goals of care conversation. Patient/Family face treatment option decisions, advanced directive decisions and anticipatory care needs.    Today's Discussion 05/28/2024  *Please note that this is a verbal dictation therefore any spelling or grammatical errors are due to the Dragon Medical One system interpretation.  Chart reviewed inclusive of vital signs, progress notes, laboratory results, and diagnostic images.   Created space and opportunity for patient to explore thoughts feelings and fears regarding current medical situation.  Patient and her family face treatment option decisions, advanced directive decisions and anticipatory care needs.   I visited the patient at bedside today and observed her engaging warmly with two visiting friends. She appeared to be enjoying their company, displaying comfort and no signs of distress. Her mood was upbeat, and she seemed to be in good spirits. The patient was alert, oriented, and able to respond appropriately to my questions and participate in conversation. She shared that she continues to struggle emotionally with adjusting to her current medical condition following an acute stroke. However, she denied  experiencing any pain or discomfort at this time and was able to clearly express her needs. She also mentioned that her daughter, [name], had visited earlier in the day.  During my visit, I interacted with the patient while her friends remained present. They shared that the patient has a deep love for dancing, which she enthusiastically confirmed, becoming visibly excited while talking about it. No complaints were voiced during the encounter.  I received a report from the bedside RN indicating that the patient can be intermittently irritable, easily becomes agitated and may occasionally refuse medications. Following this, I contacted [name] by phone to provide an update and to reassure her of our continued support during this challenging time. [name] confirmed her earlier visit and noted that she assisted the nurse in administering a scheduled medication.   She reiterated that the goals of care remain unchanged, the patient is designated as DNR with limitations, and the plan is to continue treating reversible conditions without pursuing aggressive or life-prolonging interventions. I shared that Neurology has renommended inpatient rehabilitation to support recovery and optimize functional outcomes. The patient has shown signs of dysphagia, and her diet has been advanced to a dysphagia level 2 with thin liquids permitted under nursing supervision. She is more receptive to taking medications when mixed with ice cream, as reported by her daughter.   The patient is designated as DNR with limitations, and the current goals of care focus on treating reversible conditions while avoiding aggressive or life-prolonging interventions. The family remains hopeful for clinical improvement and is committed to a day-by-day approach in supporting the patient's recovery.  Questions and concerns addressed   Palliative Support Provided.   Objective Assessment: Vital Signs Vitals:   05/28/24 1000 05/28/24 1321  BP: ROLLEN)  178/96 (!) 164/89  Pulse: 69 88  Resp: 16 16  Temp: 99 F (37.2 C) 98.5 F (36.9 C)  SpO2: 98% 96%    Intake/Output Summary (Last 24 hours) at 05/28/2024 1627 Last data filed at 05/27/2024 1900 Gross per 24 hour  Intake 120 ml  Output --  Net 120 ml   Last Weight  Most recent update: 05/24/2024 10:58 AM    Weight  77.4 kg (170 lb 10.2 oz)             Physical Exam Vitals and nursing note reviewed.  Constitutional:      Appearance: She is ill-appearing.  HENT:     Head: Normocephalic and atraumatic.  Cardiovascular:     Rate and Rhythm: Normal rate.     Comments: A-flutter, HR 83 Pulmonary:     Effort: Pulmonary effort is normal.  Abdominal:     General: Abdomen is flat.     Palpations: Abdomen is soft.  Genitourinary:    Comments: Purewick in place   Musculoskeletal:     Cervical back: Normal range of motion.     Comments: Generalized weakness more observed on the left side.   Skin:    General: Skin is warm and dry.  Neurological:     Mental Status: She is alert and oriented to person, place, and time.  Psychiatric:     Comments: Irritable.     SUMMARY OF RECOMMENDATIONS   Code Status: Maintain DNR-Limited Continue current scope of care Goal of care is medical stabilization and recovery to the extent this is possible Encouraged the daughter to continue discussions with patient and the care team to ensure decisions align with the patient's values and goals.  Continue to provide psycho-social and emotional support to patient and family Palliative medicine team will continue to follow.    Symptom Management: Per Primary team Started Seroquel 12.5 mg PO BID for agitation Palliative medicine is available to assist as needed.    Time Spent: 35 minutes  Detailed review of medical records (labs, imaging, vital signs), medically appropriate exam, discussed with treatment team, counseling and education to patient, family, & staff, documenting clinical  information, coordination of care.    ______________________________________________________________________________________ Kathlyne Bolder NP-C [place] Palliative Medicine Team Team Cell Phone: [phone] Please utilize secure chat with additional questions, if there is no response within 30 minutes please call the above phone number  Palliative Medicine Team providers are available by phone from 7am to 7pm daily and can be reached through the team cell phone.  Should this patient require assistance outside of these hours, please call the patient's attending physician.

## 2024-05-28 NOTE — Progress Notes (Signed)
 Cardiology Progress Note  Patient ID: Donna Petty MRN: [id number] DOB: [birth year] Date of Encounter: 05/28/2024 Primary Cardiologist: Lynwood Schilling, MD  Subjective   Chief Complaint: None.    HPI: Confused this morning.  A-fib well-controlled.  No chest pains or trouble breathing.  ROS:  All other ROS reviewed and negative. Pertinent positives noted in the HPI.     Telemetry  Overnight telemetry shows A-fib 70 to 80 bpm, which I personally reviewed.   Physical Exam   Vitals:   05/27/24 1927 05/27/24 2354 05/28/24 0428 05/28/24 1000  BP: (!) 170/85 (!) 157/90 (!) 149/68 (!) 178/96  Pulse:  (!) 101 77 69  Resp: 16   16  Temp: 98.2 F (36.8 C) 98 F (36.7 C) 98.5 F (36.9 C) 99 F (37.2 C)  TempSrc: Oral Axillary Axillary Oral  SpO2: 98% 97% 97% 98%  Weight:        Intake/Output Summary (Last 24 hours) at 05/28/2024 1010 Last data filed at 05/27/2024 1900 Gross per 24 hour  Intake 360 ml  Output 700 ml  Net -340 ml       05/24/2024   10:00 AM 01/26/2024    9:54 AM 01/19/2024    2:05 PM  Last 3 Weights  Weight (lbs) 170 lb 10.2 oz 171 lb 171 lb  Weight (kg) 77.4 kg 77.565 kg 77.565 kg    Body mass index is 31.72 kg/m.  General: Well nourished, well developed, in no acute distress Head: Atraumatic, normal size  Eyes: PEERLA, EOMI  Neck: Supple, no JVD Endocrine: No thryomegaly Cardiac: Normal S1, S2; irregular rhythm, no murmurs Lungs: Clear to auscultation bilaterally, no wheezing, rhonchi or rales  Abd: Soft, nontender, no hepatomegaly  Ext: No edema, pulses 2+ Musculoskeletal: No deformities, BUE and BLE strength normal and equal Skin: Warm and dry, no rashes   Neuro: Alert, awake, no focal deficits  Cardiac Studies  TTE 05/25/2024  1. Left ventricular ejection fraction, by estimation, is 60 to 65%. The  left ventricle has normal function. The left ventricle has no regional  wall motion abnormalities. There is mild asymmetric left ventricular   hypertrophy of the basal-septal segment.  Left ventricular diastolic parameters were normal.   2. Right ventricular systolic function is normal. The right ventricular  size is normal. There is normal pulmonary artery systolic pressure.   3. The mitral valve is normal in structure. Mild mitral valve  regurgitation. No evidence of mitral stenosis.   4. The aortic valve is grossly normal. There is mild calcification of the  aortic valve. Aortic valve regurgitation is not visualized. Aortic valve  sclerosis is present, with no evidence of aortic valve stenosis.   5. The inferior vena cava is normal in size with greater than 50%  respiratory variability, suggesting right atrial pressure of 3 mmHg.    Patient Profile  Donna Petty is a 88 y.o. female with chronic artery disease, hypothyroidism admitted on 05/24/2024 with right PCA stroke.  Found to be in atrial fibrillation.  Assessment & Plan   # Persistent atrial fibrillation - Course for up to me.  Here with stroke.  Rate controlled on no medications.  Echo was normal. - I do not believe she needs to be on any medications at this time.  Rates are well-controlled. - Start Eliquis when you are able per neurology. - Plan is for rate control strategy for now.  No plans to pursue cardioversion.  # CVA - Transition to Eliquis as you are  able.  # HTN - Permissive hypertension for now.  Treatment per neurology.  [facility] will sign off.   The patient is ready for discharge today from a cardiac standpoint. Medication Recommendations: As above Other recommendations (labs, testing, etc): None Follow up as an outpatient: 6 to 8 weeks with Dr. Lavona  For questions or updates, please contact [facility] Please consult [email or web address] for contact info under         Signed, Darryle T. Barbaraann, MD, [facility]  [facility]  05/28/2024 10:10 AM

## 2024-05-29 ENCOUNTER — Inpatient Hospital Stay (HOSPITAL_COMMUNITY)

## 2024-05-29 DIAGNOSIS — R29708 NIHSS score 8: Secondary | ICD-10-CM | POA: Diagnosis not present

## 2024-05-29 DIAGNOSIS — I63531 Cerebral infarction due to unspecified occlusion or stenosis of right posterior cerebral artery: Secondary | ICD-10-CM | POA: Diagnosis not present

## 2024-05-29 DIAGNOSIS — I739 Peripheral vascular disease, unspecified: Secondary | ICD-10-CM | POA: Diagnosis not present

## 2024-05-29 DIAGNOSIS — Z515 Encounter for palliative care: Secondary | ICD-10-CM | POA: Diagnosis not present

## 2024-05-29 DIAGNOSIS — I4892 Unspecified atrial flutter: Secondary | ICD-10-CM | POA: Diagnosis not present

## 2024-05-29 DIAGNOSIS — I639 Cerebral infarction, unspecified: Secondary | ICD-10-CM | POA: Diagnosis not present

## 2024-05-29 DIAGNOSIS — I63431 Cerebral infarction due to embolism of right posterior cerebral artery: Secondary | ICD-10-CM | POA: Diagnosis not present

## 2024-05-29 DIAGNOSIS — Z7189 Other specified counseling: Secondary | ICD-10-CM | POA: Diagnosis not present

## 2024-05-29 LAB — BASIC METABOLIC PANEL WITH GFR
Anion gap: 14 (ref 5–15)
BUN: 14 mg/dL (ref 8–23)
CO2: 23 mmol/L (ref 22–32)
Calcium: 8.9 mg/dL (ref 8.9–10.3)
Chloride: 103 mmol/L (ref 98–111)
Creatinine, Ser: 1.25 mg/dL — ABNORMAL HIGH (ref 0.44–1.00)
GFR, Estimated: 42 mL/min — ABNORMAL LOW (ref >60)
Glucose, Bld: 95 mg/dL (ref 70–99)
Potassium: 3.5 mmol/L (ref 3.5–5.1)
Sodium: 140 mmol/L (ref 135–145)

## 2024-05-29 LAB — CBC
HCT: 37.6 % (ref 36.0–46.0)
Hemoglobin: 12.5 g/dL (ref 12.0–15.0)
MCH: 30 pg (ref 26.0–34.0)
MCHC: 33.2 g/dL (ref 30.0–36.0)
MCV: 90.2 fL (ref 80.0–100.0)
Platelets: 297 K/uL (ref 150–400)
RBC: 4.17 MIL/uL (ref 3.87–5.11)
RDW: 14.7 % (ref 11.5–15.5)
WBC: 8.5 K/uL (ref 4.0–10.5)
nRBC: 0 % (ref 0.0–0.2)

## 2024-05-29 MED ORDER — APIXABAN 5 MG PO TABS
5.0000 mg | ORAL_TABLET | Freq: Two times a day (BID) | ORAL | Status: DC
  Administered 2024-05-30 – 2024-06-02 (×7): 5 mg via ORAL
  Filled 2024-05-29 (×7): qty 1

## 2024-05-29 MED ORDER — HEPARIN SODIUM (PORCINE) 5000 UNIT/ML IJ SOLN
5000.0000 [IU] | Freq: Three times a day (TID) | INTRAMUSCULAR | Status: AC
  Administered 2024-05-29 – 2024-05-30 (×2): 5000 [IU] via SUBCUTANEOUS
  Filled 2024-05-29 (×2): qty 1

## 2024-05-29 NOTE — Progress Notes (Signed)
 PHARMACY - ANTICOAGULATION CONSULT NOTE  Pharmacy Consult for Eliquis Indication: atrial fibrillation  No Known Allergies  Patient Measurements: Weight: 77.4 kg (170 lb 10.2 oz)  Vital Signs: Temp: 98.8 F (37.1 C) (10/26 1016) Temp Source: Oral (10/26 1016) BP: 149/76 (10/26 1237) Pulse Rate: 69 (10/26 1237)  Labs: Recent Labs    05/28/24 0522 05/29/24 0237  HGB 12.7 12.5  HCT 38.8 37.6  PLT 287 297  CREATININE 1.06* 1.25*    Estimated Creatinine Clearance: 30.8 mL/min (A) (by C-G formula based on SCr of 1.25 mg/dL (H)).   Medical History: Past Medical History:  Diagnosis Date   Acid reflux    Arthritis    Carotid artery disease    50-70% left internal carotid artery stenosis   Depression    Dyspnea    Hypothyroidism    Macular degeneration    Thyroid  condition    Visual changes     Medications:  Scheduled:   amLODipine  5 mg Oral Daily   atorvastatin  20 mg Oral Daily   Chlorhexidine  Gluconate Cloth  6 each Topical Daily   heparin injection (subcutaneous)  5,000 Units Subcutaneous Q8H   levothyroxine   100 mcg Oral Q0600   memantine   5 mg Oral BID   mirtazapine   15 mg Oral QHS   pantoprazole   40 mg Oral QHS   polyethylene glycol  17 g Oral BID   QUEtiapine  12.5 mg Oral BID   Infusions:  PRN: acetaminophen  **OR** acetaminophen  (TYLENOL ) oral liquid 160 mg/5 mL **OR** acetaminophen , ALPRAZolam, labetalol, methocarbamol , senna-docusate  Assessment: 88 yo female admitted with R PCA large infarct, unsuccessful attempt at mechanical thrombectomy. New aflutter on 10/23, Pharmacy consulted for Eliquis to begin 10/27. Not on anticoagulants PTA.   Goal of Therapy:  Therapeutic anticoagulation, stroke prevention   Plan:  On 10/27, begin Eliquis 5mg  PO BID Will provide education and copay check prior to discharge  Rocky Slade, PharmD, BCPS 05/29/2024,4:47 PM

## 2024-05-29 NOTE — Progress Notes (Signed)
 Inpatient Rehab Admissions Coordinator:    CIR consulted. Pt confused, not able to answer questions. I will try and reach out to family to discuss further.  Leita Kleine, MS, CCC-SLP Rehab Admissions Coordinator  [phone] (celll) [phone] (office)

## 2024-05-29 NOTE — Progress Notes (Addendum)
 STROKE TEAM PROGRESS NOTE    SIGNIFICANT HOSPITAL EVENTS 10/21: Patient admitted with left-sided weakness and right gaze deviation, mechanical thrombectomy attempted for right PCA P1 occlusion but unsuccessful due to extreme vertebral artery tortuosity 10/22- Transfer out of ICU 10/23: - Atrial flutter on monitor, cardiology consulted. - Okay with anticoagulation per cardiology.  Not planning rhythm control.  INTERIM HISTORY/SUBJECTIVE Patient seen in her room with no family at the bedside.  She is less agitated this morning but states she would like to go to church.  She has been hemodynamically stable and afebrile overnight.  OBJECTIVE CBC    Component Value Date/Time   WBC 8.5 05/29/2024 0237   RBC 4.17 05/29/2024 0237   HGB 12.5 05/29/2024 0237   HCT 37.6 05/29/2024 0237   PLT 297 05/29/2024 0237   MCV 90.2 05/29/2024 0237   MCH 30.0 05/29/2024 0237   MCHC 33.2 05/29/2024 0237   RDW 14.7 05/29/2024 0237   LYMPHSABS 2.2 05/24/2024 1056   MONOABS 0.7 05/24/2024 1056   EOSABS 0.2 05/24/2024 1056   BASOSABS 0.0 05/24/2024 1056  BMET    Component Value Date/Time   NA 140 05/29/2024 0237   K 3.5 05/29/2024 0237   CL 103 05/29/2024 0237   CO2 23 05/29/2024 0237   GLUCOSE 95 05/29/2024 0237   BUN 14 05/29/2024 0237   CREATININE 1.25 (H) 05/29/2024 0237   CALCIUM  8.9 05/29/2024 0237   GFRNONAA 42 (L) 05/29/2024 0237   IMAGING past 24 hours No results found.  Vitals:   05/28/24 2012 05/29/24 0104 05/29/24 0611 05/29/24 1016  BP: (!) 147/94 (!) 145/76 (!) 146/55 (!) 141/57  Pulse: (!) 59 68 73 69  Resp: 18  18 19   Temp: 98.2 F (36.8 C) 98.5 F (36.9 C) 98.5 F (36.9 C) 98.8 F (37.1 C)  TempSrc: Axillary Axillary Oral Oral  SpO2: 96% 93% 95% 95%  Weight:       PHYSICAL EXAM General: Ill-appearing elderly patient in no acute distress Psych:  Mood and affect appropriate for situation. CV: Regular rate and rhythm on monitor Respiratory:  Regular, unlabored  respirations on room air   NEURO:  Mental Status: Patient is drowsy, responds to voice and touch, does not answer questions but states she would like to go to church without dysarthria Speech/Language: speech is minimal but without dysarthria or aphasia  Cranial Nerves:  II: PERRL.  Left hemianopsia III, IV, VI: Right gaze deviation, unable to cross midline V: Sensation is intact to light touch and diminished on the left VII: Face is symmetrical resting and smiling VIII: Hearing intact to voice. IX, X: Phonation is normal XII: Tongue protrudes midline Motor: Moves all 4 extremities, upper extremities with antigravity strength, does not lift lower extremities off the bed today Tone: is normal and bulk is normal Sensation- Intact to light touch on the right but absent in the left arm and diminished in the left leg Gait- deferred  Most Recent NIH  1a Level of Conscious.: 0 1b LOC Questions: 0 1c LOC Commands: 0 2 Best Gaze: 2 3 Visual: 2 4 Facial Palsy: 0 5a Motor Arm - left: 2 5b Motor Arm - Right: 0 6a Motor Leg - Left: 0 6b Motor Leg - Right: 0 7 Limb Ataxia: 0 8 Sensory: 1 9 Best Language: 0 10 Dysarthria: 1 11 Extinct. and Inatten.: 0 TOTAL: 8  ASSESSMENT/PLAN Donna Petty is a 88 y.o. female with history of hypothyroidism, macular degeneration and depression admitted for left  hemianopsia, right gaze deviation and left hemiparesis.  She was found to have a right PCA P1 occlusion, and mechanical thrombectomy was attempted.  Unfortunately, this procedure was unsuccessful due to extreme tortuosity of the vertebral artery. MRI reveals large acute right PCA infarct and a conditional scattered small bilateral acute cerebral and cerebellar infarcts. NIH on Admission 7  Stroke: Right PCA large infarct with multiple small scattered infarcts in bilateral hemispheres with R P1 occlusion s/p IR but unsuccessful, etiology: cardioembolic from new diagnosed atrial flutter   code  Stroke CT head No acute abnormality. Small vessel disease. ASPECTS 10.    CTA head & neck right PCA P1 segment occlusion, right ICA origin plaque with 60% stenosis, bilateral cervical ICA tortuosity and possible FMD, moderate left ICA siphon stenosis, mild right M2 branch stenoses S/p IR but unsuccessful due to extreme tortuous VA MRI large acute right PCA infarct, additional scattered small acute bilateral cerebral and cerebellar infarcts, moderate cerebral atrophy and chronic small vessel ischemic disease CT repeat R large PCA infarct stable, no hemorrahge 2D Echo EF 60-65%, LA normal  LDL 95 HgbA1c 5.4 VTE prophylaxis - SQ heparin No antithrombotic prior to admission, will switch aspirin  81 mg daily and clopidogrel 75 mg daily to eliquis in am Therapy recommendations: CIR Disposition: Pending  A- flutter New diagnosis Cardiology consulted Replace K  Will start AC in am   Anxiety and agitation Vascular dementia Started Xanax 0.25 mg tabled BID PRN for anxiety  Start Seroquel 12.5 mg twice daily Off b/l hand mittens On home nemanda and Remeron  Pt much improved today with family at the bedside  Hypertension Home meds: None Stable Keep systolic blood pressure less than 180  Hyperlipidemia Home meds: None LDL 95, goal < 70 Add atorvastatin 20 mg daily Continue statin at discharge  Dysphagia Patient has post-stroke dysphagia, SLP  On dysphagia 2 and thin liquid Advance diet as tolerated  Other Stroke Risk Factors Obesity, Body mass index is 31.72 kg/m., BMI >/= 30 associated with increased stroke risk, recommend weight loss, diet and exercise as appropriate   Other Active Problems AKI, Cre 1.40--1.05--1.06--1.25, encourage po intake  Hospital day # 5  Patient seen and examined by NP/APP and then by MD. MD to update note as needed.   Donna Petty Donna Petty , MSN, AGACNP-BC [facility] See Amion for schedule and pager information 05/29/2024 12:49  PM  ATTENDING NOTE: I reviewed above note and agree with the assessment and plan. Pt was seen and examined.   Son and daughter in law are at the bedside. Pt lying in bed, off mittens, calm, quiet and following all simple commands, still has left HH, but moving all extremities. CT done showed no hemorrhage. Will transition from DAPT to eliquis in am. Cre still high, encourage po intake.   For detailed assessment and plan, please refer to above as I have made changes wherever appropriate.   Ary Cummins, MD PhD Stroke Neurology 05/29/2024 5:05 PM      To contact Stroke Continuity provider, please refer to [email or web address]. After hours, contact General Neurology

## 2024-05-29 NOTE — Progress Notes (Signed)
 Agitated, states she's not answering any more questions because we are asking her the same question. She refuses to wear SCD's . Education provided on importance of following command to finish neuro exam, also encouraged to wear SCD's

## 2024-05-29 NOTE — Progress Notes (Signed)
 Palliative Medicine Inpatient Follow Up Note   HPI: 88 y.o. female  with past medical history of hypothyroidism, dementia, carotid artery disease, GERD  admitted on 05/24/2024 with left sided weakness. Code stroke was activated, MRI showed a large right acute PCA infarct and conditional scattered small bilateral acute cerebral and cerebellar infarcts. mechanical thrombectomy attempted for right PCA P1 occlusion but unsuccessful due to extreme vertebral artery tortuosity  Patient was manifesting right sided gaze with left hemianopsia and left-sided extinction on arrival to the ED.    Worth to note that she developed new onset atrial flutter the evening of 10/22, currently being seen by Cardiologist.     PMT has been consulted to assist with goals of care conversation. Patient/Family face treatment option decisions, advanced directive decisions and anticipatory care needs.    Today's Discussion 05/29/2024  *Please note that this is a verbal dictation therefore any spelling or grammatical errors are due to the Dragon [employer] One system interpretation.  Chart reviewed inclusive of vital signs, progress notes, laboratory results, and diagnostic images.   Created space and opportunity for patient to explore thoughts feelings and fears regarding current medical situation.  Visited the patient at bedside today. No family members were present during the encounter. The patient appeared fatigued, frail, and chronically ill. She was alert and oriented, able to express her needs clearly. She shared feelings of frustration and sadness following her recent stroke, expressing difficulty accepting her new limitations, particularly with mobility, as she has always been very independent.  I provided education regarding the nature of her stroke and what to expect during post-stroke rehabilitation. I emphasized that recovery is a gradual process that requires patience and that it is common to feel discouraged  when faced with new physical limitations. I also shared that her daughter remains hopeful and supportive, believing that rehabilitation will help her regain function. The patient expressed feeling tired.  The nursing staff reported no acute events overnight. The patient received PRN Xanax 0.25 mg at 2310 for anxiety.  The patient is designated as DNR with limitations. Current goals of care focus on treating reversible conditions while avoiding aggressive or life-prolonging interventions. Her daughter is aware of and understands these healthcare wishes, including the decision to forgo heroic measures should the patient's condition deteriorate. Despite this, the daughter remains optimistic about the patient's recovery and is committed to supporting her through rehabilitation, taking things one day at a time.  Questions and concerns were addressed during the visit. Will continue to monitor and support the patient through her recovery process, ensuring alignment with her goals of care.  Patient and her family face treatment option decisions, advanced directive decisions and anticipatory care needs.  Questions and concerns addressed   Objective Assessment: Vital Signs Vitals:   05/29/24 0104 05/29/24 0611  BP: (!) 145/76 (!) 146/55  Pulse: 68 73  Resp:  18  Temp: 98.5 F (36.9 C) 98.5 F (36.9 C)  SpO2: 93% 95%    Intake/Output Summary (Last 24 hours) at 05/29/2024 0836 Last data filed at 05/28/2024 1700 Gross per 24 hour  Intake --  Output 400 ml  Net -400 ml   Last Weight  Most recent update: 05/24/2024 10:58 AM    Weight  77.4 kg (170 lb 10.2 oz)             Physical Exam Vitals and nursing note reviewed.  Constitutional:      Appearance: She is ill-appearing.  HENT:     Head: Normocephalic  and atraumatic.  Cardiovascular:     Rate and Rhythm: Normal rate.     Comments: A-flutter, HR 68 Pulmonary:     Effort: Pulmonary effort is normal.  Abdominal:     General:  Abdomen is flat.     Palpations: Abdomen is soft.  Genitourinary:    Comments: Purewick in place   Musculoskeletal:     Cervical back: Normal range of motion.     Comments: Generalized weakness more observed on the left side.   Skin:    General: Skin is warm and dry.  Neurological:     Mental Status: She is alert and oriented to person, place, and time.  Psychiatric:     Comments: Irritable.     SUMMARY OF RECOMMENDATIONS   Code Status: Maintain DNR-Limited. No heroic measures.  Continue current scope of care.  Goal of care is medical stabilization and recovery to the extent this is possible Encouraged the daughter to continue discussions with patient and the care team to ensure decisions align with the patient's values and goals.  PMT supports recommendation for post stroke rehabilitation as this aligns with patient/family's wishes for now Continue to provide psycho-social and emotional support to patient and family Palliative medicine team will continue to follow.    Symptom Management: Per Primary team Started Seroquel 12.5 mg PO BID for agitation Palliative medicine is available to assist as needed.    Time Spent: 35 minutes  Detailed review of medical records (labs, imaging, vital signs), medically appropriate exam, discussed with treatment team, counseling and education to patient, family, & staff, documenting clinical information, coordination of care.    ______________________________________________________________________________________ Kathlyne Bolder NP-C [place] Palliative Medicine Team Team Cell Phone: [phone] Please utilize secure chat with additional questions, if there is no response within 30 minutes please call the above phone number  Palliative Medicine Team providers are available by phone from 7am to 7pm daily and can be reached through the team cell phone.  Should this patient require assistance outside of these hours, please call the  patient's attending physician.

## 2024-05-29 NOTE — Plan of Care (Signed)
  Problem: Nutrition: Goal: Risk of aspiration will decrease Outcome: Progressing Goal: Dietary intake will improve Outcome: Progressing   Problem: Clinical Measurements: Goal: Ability to maintain clinical measurements within normal limits will improve Outcome: Progressing Goal: Will remain free from infection Outcome: Progressing Goal: Diagnostic test results will improve Outcome: Progressing Goal: Respiratory complications will improve Outcome: Progressing Goal: Cardiovascular complication will be avoided Outcome: Progressing   Problem: Activity: Goal: Risk for activity intolerance will decrease Outcome: Progressing   Problem: Elimination: Goal: Will not experience complications related to bowel motility Outcome: Progressing Goal: Will not experience complications related to urinary retention Outcome: Progressing   Problem: Pain Managment: Goal: General experience of comfort will improve and/or be controlled Outcome: Progressing   Problem: Safety: Goal: Ability to remain free from injury will improve Outcome: Progressing   Problem: Skin Integrity: Goal: Risk for impaired skin integrity will decrease Outcome: Progressing

## 2024-05-30 ENCOUNTER — Encounter (HOSPITAL_COMMUNITY): Payer: Self-pay | Admitting: Neurology

## 2024-05-30 ENCOUNTER — Other Ambulatory Visit: Payer: Self-pay

## 2024-05-30 ENCOUNTER — Telehealth (HOSPITAL_COMMUNITY): Payer: Self-pay | Admitting: Pharmacy Technician

## 2024-05-30 ENCOUNTER — Other Ambulatory Visit (HOSPITAL_COMMUNITY): Payer: Self-pay

## 2024-05-30 DIAGNOSIS — I4892 Unspecified atrial flutter: Secondary | ICD-10-CM | POA: Diagnosis not present

## 2024-05-30 DIAGNOSIS — I63531 Cerebral infarction due to unspecified occlusion or stenosis of right posterior cerebral artery: Secondary | ICD-10-CM | POA: Diagnosis not present

## 2024-05-30 DIAGNOSIS — I739 Peripheral vascular disease, unspecified: Secondary | ICD-10-CM | POA: Diagnosis not present

## 2024-05-30 DIAGNOSIS — Z7901 Long term (current) use of anticoagulants: Secondary | ICD-10-CM

## 2024-05-30 DIAGNOSIS — I63431 Cerebral infarction due to embolism of right posterior cerebral artery: Secondary | ICD-10-CM | POA: Diagnosis not present

## 2024-05-30 DIAGNOSIS — R41842 Visuospatial deficit: Secondary | ICD-10-CM

## 2024-05-30 DIAGNOSIS — R29708 NIHSS score 8: Secondary | ICD-10-CM | POA: Diagnosis not present

## 2024-05-30 LAB — CBC
HCT: 37.2 % (ref 36.0–46.0)
Hemoglobin: 12.2 g/dL (ref 12.0–15.0)
MCH: 30 pg (ref 26.0–34.0)
MCHC: 32.8 g/dL (ref 30.0–36.0)
MCV: 91.6 fL (ref 80.0–100.0)
Platelets: 281 K/uL (ref 150–400)
RBC: 4.06 MIL/uL (ref 3.87–5.11)
RDW: 14.8 % (ref 11.5–15.5)
WBC: 6.7 K/uL (ref 4.0–10.5)
nRBC: 0 % (ref 0.0–0.2)

## 2024-05-30 LAB — BASIC METABOLIC PANEL WITH GFR
Anion gap: 11 (ref 5–15)
BUN: 15 mg/dL (ref 8–23)
CO2: 23 mmol/L (ref 22–32)
Calcium: 8.6 mg/dL — ABNORMAL LOW (ref 8.9–10.3)
Chloride: 106 mmol/L (ref 98–111)
Creatinine, Ser: 1.38 mg/dL — ABNORMAL HIGH (ref 0.44–1.00)
GFR, Estimated: 37 mL/min — ABNORMAL LOW (ref >60)
Glucose, Bld: 89 mg/dL (ref 70–99)
Potassium: 3.7 mmol/L (ref 3.5–5.1)
Sodium: 140 mmol/L (ref 135–145)

## 2024-05-30 MED ORDER — SODIUM CHLORIDE 0.9 % IV SOLN: INTRAVENOUS | Status: DC

## 2024-05-30 NOTE — PMR Pre-admission (Signed)
 PMR Admission Coordinator Pre-Admission Assessment  Patient: Donna Petty is an 88 y.o., female MRN: [id number] DOB: 1936/01/16 Height:   Weight: 77.4 kg  Insurance Information HMO: yes    PPO:      PCP:      IPA:      80/20:      OTHER:  PRIMARY: [employer]      Policy#: [id number]      Subscriber:  CM Name: Ellouise      Phone#:  [phone]  Fax#: [phone] Pre-Cert#: [id number]   I received auth for CIR from [employer]  with HTA for admit 06/02/24 through 11/5.     Employer:  Benefits:  Phone #[phone]-option 1, spoke with Geni Gemma Date: 08/05/2023 - 08/03/2024 Deductible: does not have one OOP Max: $3,400 ($245 met) CIR: $195/day co-pay for days 1-6, $0/day days 7-90 SNF:  $0/day co-pay for days 1-20, $214/day co-pay for days 21-100; limited to 100 days/benefit period Outpatient: $5/visit co-pay; limited by medical necessity Home Health:  100% coverage DME: 80% coverage; 20% co-insurance Providers: In network  SECONDARY:       Policy#:      Phone#:   Artist:       Phone#:   The Data Processing Manager" for patients in Inpatient Rehabilitation Facilities with attached "Privacy Act Statement-Health Care Records" was provided and verbally reviewed with: Patient  Emergency Contact Information Contact Information     Name Relation Home Work Mobile   Ratcliff,Teresa Daughter [phone]        Other Contacts     Name Relation Home Work Mobile   Frye,Edwin Relative [phone]  [phone]       Current Medical History  Patient Admitting Diagnosis: CVA History of Present Illness:  Donna Petty is a 88 year old right-handed female with history of depression, hypothyroidism, left ICA stenosis 50-70%, hyperlipidemia, memory deficits maintained on Namenda , GERD, macular degeneration.  Per chart review patient lives with her children.  1 level home 5 steps to entry.  Independent prior to admission however she will use a rollator at times  within the community.  Presented 05/24/2024 with acute onset of left-sided weakness right side gaze and left hemianopsia.  Blood pressure elevated 190s systolic in the ED.  Cranial CT scan showed stable chronic small vessel disease.  CTA showed right PCA P1 segment occlusion bilateral proximal subclavian artery and vertebral artery origin atherosclerosis without hemodynamically significant stenosis.  Right ICA origin plaque with 60% stenosis.  Bilateral cervical ICA tortuosity and possible fibromuscular dysplasia.  LICA siphon moderate supraclinoid stenosis.  Patient did receive TNK.  Underwent cerebral arteriogram attempted unsuccessful thrombectomy 05/24/2024 per Dr. Nancyann Burns.  CTA angio of the head follow-up showed early right PCA territory cytotoxic edema without hemorrhagic transformation or mass effect.  A follow-up MRI was completed showing large acute right PCA infarct with additional scattered small acute bilateral cerebral and cerebellar infarcts.  Moderate cerebral atrophy with mild chronic small vessel ischemic disease with chronic lacunar infarct.  Admission chemistries unremarkable except creatinine 1.28.  Echocardiogram ejection fraction of 60 to 65% no wall motion abnormalities.  Initially placed on aspirin  and Plavix for CVA prophylaxis.  Hospital course cardiology services consulted 10/23 for evaluation of atrial flutter and patient ultimately has been placed on Eliquis for both CVA prophylaxis and a flutter and aspirin  and Plavix discontinued.  Palliative care consulted to establish goals of care.  Her diet has been advanced to a regular consistency.  Complete NIHSS TOTAL: 6  Patient's  medical record from [facility] has been reviewed by the rehabilitation admission coordinator and physician.  Past Medical History  Past Medical History:  Diagnosis Date   Acid reflux    Arthritis    Carotid artery disease    50-70% left internal carotid artery stenosis   Depression     Dyspnea    Hypothyroidism    Macular degeneration    Thyroid  condition    Visual changes     Has the patient had major surgery during 100 days prior to admission? No  Family History   family history includes Hypertension in her father.  Current Medications  Current Facility-Administered Medications:    acetaminophen  (TYLENOL ) tablet 650 mg, 650 mg, Oral, Q4H PRN, 650 mg at 05/29/24 2217 **OR** acetaminophen  (TYLENOL ) 160 MG/5ML solution 650 mg, 650 mg, Per Tube, Q4H PRN **OR** acetaminophen  (TYLENOL ) suppository 650 mg, 650 mg, Rectal, Q4H PRN, Khaliqdina, Salman, MD   ALPRAZolam (XANAX) tablet 0.25 mg, 0.25 mg, Oral, BID PRN, Toberman, Stevi W, NP, 0.25 mg at 05/29/24 2217   amLODipine (NORVASC) tablet 5 mg, 5 mg, Oral, Daily, Rosemarie, Pramod S, MD, 5 mg at 05/30/24 0902   apixaban (ELIQUIS) tablet 5 mg, 5 mg, Oral, BID, Billy Rocky SAUNDERS, RPH, 5 mg at 05/30/24 0901   atorvastatin (LIPITOR) tablet 20 mg, 20 mg, Oral, Daily, Jerri Pfeiffer, MD, 20 mg at 05/30/24 0902   Chlorhexidine  Gluconate Cloth 2 % PADS 6 each, 6 each, Topical, Daily, Khaliqdina, Salman, MD, 6 each at 05/30/24 0902   labetalol (NORMODYNE) injection 20 mg, 20 mg, Intravenous, Q2H PRN, Sethi, Pramod S, MD, 20 mg at 05/25/24 1256   levothyroxine  (SYNTHROID ) tablet 100 mcg, 100 mcg, Oral, Q0600, Sethi, Pramod S, MD, 100 mcg at 05/30/24 9462   memantine  (NAMENDA ) tablet 5 mg, 5 mg, Oral, BID, Sethi, Pramod S, MD, 5 mg at 05/30/24 9097   methocarbamol  (ROBAXIN ) tablet 500 mg, 500 mg, Oral, Q8H PRN, Sethi, Pramod S, MD, 500 mg at 05/29/24 2217   mirtazapine  (REMERON ) tablet 15 mg, 15 mg, Oral, QHS, Sethi, Pramod S, MD, 15 mg at 05/29/24 2216   pantoprazole  (PROTONIX ) EC tablet 40 mg, 40 mg, Oral, QHS, Sethi, Pramod S, MD, 40 mg at 05/29/24 2217   polyethylene glycol (MIRALAX  / GLYCOLAX ) packet 17 g, 17 g, Oral, BID, Rosemarie, Pramod S, MD, 17 g at 05/30/24 0902   QUEtiapine (SEROQUEL) tablet 12.5 mg, 12.5 mg, Oral, BID, Shafer,  Devon, NP, 12.5 mg at 05/30/24 0902   senna-docusate (Senokot-S) tablet 1 tablet, 1 tablet, Oral, QHS PRN, Khaliqdina, Salman, MD, 1 tablet at 05/25/24 1300  Patients Current Diet:  Diet Order             Diet regular Fluid consistency: Thin  Diet effective now                   Precautions / Restrictions Precautions Precautions: Fall Precaution/Restrictions Comments: L neglect, SBP <180 Restrictions Weight Bearing Restrictions Per Provider Order: No   Has the patient had 2 or more falls or a fall with injury in the past year? No  Prior Activity Level  Pt. Active in the community PTA  Prior Functional Level Self Care: Did the patient need help bathing, dressing, using the toilet or eating? Independent  Indoor Mobility: Did the patient need assistance with walking from room to room (with or without device)? Independent  Stairs: Did the patient need assistance with internal or external stairs (with or without device)? Independent  Functional Cognition: Did the patient need help planning regular tasks such as shopping or remembering to take medications? Independent  Patient Information Are you of Hispanic, Latino/a,or Spanish origin?: A. No, not of Hispanic, Latino/a, or Spanish origin What is your race?: A. White Do you need or want an interpreter to communicate with a doctor or health care staff?: 0. No Patient information obtained via proxy : yes  Patient's Response To:  Health Literacy and Transportation Is the patient able to respond to health literacy and transportation needs?: No Health Literacy - How often do you need to have someone help you when you read instructions, pamphlets, or other written material from your doctor or pharmacy?: Patient unable to respond In the past 12 months, has lack of transportation kept you from medical appointments or from getting medications?: No In the past 12 months, has lack of transportation kept you from meetings, work, or from  getting things needed for daily living?: No Higher Education Careers Adviser obtained via proxy: yes  Journalist, Newspaper / Equipment Home Equipment: Agricultural Consultant (2 wheels), Rollator (4 wheels), [employer] - single point  Prior Device Use: Indicate devices/aids used by the patient prior to current illness, exacerbation or injury? None of the above  Current Functional Level Cognition  Arousal/Alertness: Awake/alert Overall Cognitive Status: Within Functional Limits for tasks assessed Orientation Level: Oriented to person, Oriented to place, Oriented to time, Oriented to situation Attention: Sustained Sustained Attention: Appears intact Awareness: Impaired Awareness Impairment: Intellectual impairment, Emergent impairment, Anticipatory impairment Safety/Judgment: Impaired    Extremity Assessment (includes Sensation/Coordination)  Upper Extremity Assessment: Right hand dominant, LUE deficits/detail LUE Deficits / Details: grasp 5 out 5, decreased cooridnation, pt with decreased shoulder flexion to 80 degrees LUE Coordination: decreased gross motor, decreased fine motor  Lower Extremity Assessment: Defer to PT evaluation    ADLs  Overall ADL's : Needs assistance/impaired Eating/Feeding: Set up, Sitting Eating/Feeding Details (indicate cue type and reason): assistance with opening containers Grooming: Wash/dry hands, Wash/dry face, Sitting, Supervision/safety, Cueing for sequencing Upper Body Bathing: Maximal assistance, Bed level Lower Body Bathing: Total assistance Upper Body Dressing : Maximal assistance Lower Body Dressing: Total assistance Toilet Transfer: Maximal assistance, +2 for physical assistance, +2 for safety/equipment (sit <>stand)    Mobility  Overal bed mobility: Needs Assistance Bed Mobility: Supine to Sit Rolling: Max assist, +2 for physical assistance Sidelying to sit: Max assist, +2 for physical assistance Supine to sit: Mod assist, HOB elevated Sit to  supine: Max assist, +2 for physical assistance General bed mobility comments: verbal cues and mod assist due to assistance needed with trunk    Transfers  Overall transfer level: Needs assistance Equipment used: Rolling walker (2 wheels) Transfers: Sit to/from Stand Sit to Stand: Min assist Bed to/from chair/wheelchair/BSC transfer type:: Via Financial Planner via Lift Equipment: [employer] transfer comment: pt benefits from cues for hand placement and to increase anterior weight shift    Ambulation / Gait / Stairs / Wheelchair Mobility  Ambulation/Gait Ambulation/Gait assistance: Editor, Commissioning (Feet): 5 Feet (5' x 2 trials) Assistive device: Rolling walker (2 wheels) Gait Pattern/deviations: Step-to pattern, Shuffle, Trunk flexed General Gait Details: pt with slowed shuffling steps, trunk flexed over RW which she reports is baseline due to chronic back pain Gait velocity: reduced Gait velocity interpretation: <1.31 ft/sec, indicative of household ambulator    Posture / Balance Dynamic Sitting Balance Sitting balance - Comments: CGA on EOB Balance Overall balance assessment: Needs assistance Sitting-balance support: No upper extremity  supported, Feet supported Sitting balance-Leahy Scale: Fair Sitting balance - Comments: CGA on EOB Standing balance support: Bilateral upper extremity supported, Reliant on assistive device for balance Standing balance-Leahy Scale: Poor Standing balance comment: reliant on Stedy for support    Special considerations/life events  Skin intact  and Special service needs none   Previous Home Environment (from acute therapy documentation) Living Arrangements: Children Available Help at Discharge: Family Type of Home: House Home Layout: One level Home Access: Stairs to enter Entrance Stairs-Rails: Right, Left, Can reach both Entrance Stairs-Number of Steps: 6 Bathroom Shower/Tub: Engineer, Manufacturing Systems: Handicapped  height Bathroom Accessibility: Yes How Accessible: Accessible via walker Additional Comments: has 2 cats Bae and simon  Discharge Living Setting Plans for Discharge Living Setting: House Type of Home at Discharge: House Discharge Home Layout: One level Discharge Home Access: Stairs to enter Entrance Stairs-Rails: Right, Left, Can reach both Entrance Stairs-Number of Steps: 6 Discharge Bathroom Shower/Tub: Tub/shower unit Discharge Bathroom Toilet: Standard Discharge Bathroom Accessibility: Yes How Accessible: Accessible via walker  Social/Family/Support Systems Patient Roles: Other (Comment) Contact Information: [phone] Anticipated Caregiver: [name] Ability/Limitations of Caregiver: Min A Caregiver Availability: 24/7 Discharge Plan Discussed with Primary Caregiver: Yes Is Caregiver In Agreement with Plan?: Yes Does Caregiver/Family have Issues with Lodging/Transportation while Pt is in Rehab?: No  Goals Patient/Family Goal for Rehab: PT/OT/SLP Supervision Expected length of stay: 12-14 days Pt/Family Agrees to Admission and willing to participate: Yes Program Orientation Provided & Reviewed with Pt/Caregiver Including Roles  & Responsibilities: Yes  Decrease burden of Care through IP rehab admission: not anticipated  Possible need for SNF placement upon discharge: not anticipated  Patient Condition: I have reviewed medical [facility] from [facility], spoken with CM, and patient and daughter. I met with patient at the bedside for inpatient rehabilitation assessment.  Patient will benefit from ongoing PT, OT, and SLP, can actively participate in 3 hours of therapy a day 5 days of the week, and can make measurable gains during the admission.  Patient will also benefit from the coordinated team approach during an Inpatient Acute Rehabilitation admission.  The patient will receive intensive therapy as well as Rehabilitation physician, nursing, social worker, and  care management interventions.  Due to bladder management, bowel management, safety, skin/wound care, disease management, medication administration, pain management, and patient education the patient requires 24 hour a day rehabilitation nursing.  The patient is currently mod A  with mobility and basic ADLs.  Discharge setting and therapy post discharge at home with home health is anticipated.  Patient has agreed to participate in the Acute Inpatient Rehabilitation Program and will admit today.  Preadmission Screen Completed By:  Leita KATHEE Kleine, 05/30/2024 12:36 PM ______________________________________________________________________   Discussed status with Dr. Cornelio on 06/02/24 at 951 and received approval for admission today.  Admission Coordinator:  Leita KATHEE Kleine, CCC-SLP, time 951/Date 06/02/24   Assessment/Plan: Diagnosis: PCA stroke Does the need for close, 24 hr/day Medical supervision in concert with the patient's rehab needs make it unreasonable for this patient to be served in a less intensive setting? Yes Co-Morbidities requiring supervision/potential complications: Macular degeneration, ICA stenosis; HTN, L hemiparesis; depression, dementia on Namenda ; L hemianopsia;  Due to bladder management, bowel management, safety, skin/wound care, disease management, medication administration, and patient education, does the patient require 24 hr/day rehab nursing? Yes Does the patient require coordinated care of a physician, rehab nurse, PT, OT, and SLP to address physical and functional deficits in the context of the  above medical diagnosis(es)? Yes Addressing deficits in the following areas: balance, endurance, locomotion, strength, transferring, bowel/bladder control, bathing, dressing, feeding, grooming, toileting, cognition, speech, and language Can the patient actively participate in an intensive therapy program of at least 3 hrs of therapy 5 days a week? Yes The potential for patient to  make measurable gains while on inpatient rehab is good Anticipated functional outcomes upon discharge from inpatient rehab: supervision PT, supervision OT, supervision SLP Estimated rehab length of stay to reach the above functional goals is: 12-14 days Anticipated discharge destination: Home 10. Overall Rehab/Functional Prognosis: good   MD Signature:

## 2024-05-30 NOTE — Progress Notes (Signed)
 Virtual visit with patient and daughter completed admission documentation with Pt permission and daughter. Notified floor RN vitals, Ht, Wt are what's outstanding.

## 2024-05-30 NOTE — Plan of Care (Signed)
  Problem: Education: Goal: Knowledge of disease or condition will improve Outcome: Progressing Goal: Knowledge of secondary prevention will improve (MUST DOCUMENT ALL) Outcome: Progressing Goal: Knowledge of patient specific risk factors will improve (DELETE if not current risk factor) Outcome: Progressing   Problem: Ischemic Stroke/TIA Tissue Perfusion: Goal: Complications of ischemic stroke/TIA will be minimized Outcome: Progressing

## 2024-05-30 NOTE — Telephone Encounter (Signed)
 Patient Product/process development scientist completed.    The patient is insured through [employer]/ [employer]. Patient has Medicare and is not eligible for a copay card, but may be able to apply for patient assistance or Medicare RX Payment Plan (Patient Must reach out to their plan, if eligible for payment plan), if available.    Ran test claim for Eliquis  5 mg and the current 30 day co-pay is $47.00.   This test claim was processed through [facility]- copay amounts may vary at other pharmacies due to pharmacy/plan contracts, or as the patient moves through the different stages of their insurance plan.     Reyes Sharps, CPHT Pharmacy Technician Patient Advocate Specialist Lead [facility] Patient Advocate Team Direct Number: [phone]  Fax: [phone]

## 2024-05-30 NOTE — Progress Notes (Signed)
 Physical Therapy Treatment Patient Details Name: Donna Petty MRN: [id number] DOB: 1935-11-11 Today's Date: 05/30/2024   History of Present Illness Donna Petty is a 88 y.o. female who was at home with her daughter when she had sudden onset left-sided symptoms 10/21. 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 MRI with large acute right PCA infarct and additional scattered small acute bilateral cerebral and cerebellar infarcts. PMH: hypothyroidism, macular degeneration, carotid artery disease, depression    PT Comments  Pt tolerates treatment well, transferring with less physical assistance and initiating gait training for short distances. Pt demonstrates poor processing time and requires multiple cues to complete desired tasks. Pt appears to respond to cues more quickly when provided from R side, likely related to L inattention. Pt will benefit from continued frequent mobilization in an effort to reduce falls risk and to improve activity tolerance. Patient will benefit from intensive inpatient follow-up therapy, >3 hours/day.    If plan is discharge home, recommend the following: A little help with bathing/dressing/bathroom;Supervision due to cognitive status;Help with stairs or ramp for entrance;Assist for transportation;Assistance with cooking/housework;Two people to help with walking and/or transfers;Direct supervision/assist for medications management;Direct supervision/assist for financial management   Can travel by private vehicle        Equipment Recommendations  Rolling walker (2 wheels);BSC/3in1    Recommendations for Other Services       Precautions / Restrictions Precautions Precautions: Fall Recall of Precautions/Restrictions: Impaired Precaution/Restrictions Comments: L neglect, SBP <180 Restrictions Weight Bearing Restrictions Per Provider Order: No     Mobility  Bed Mobility                    Transfers Overall  transfer level: Needs assistance Equipment used: Rolling walker (2 wheels) Transfers: Sit to/from Stand Sit to Stand: Min assist           General transfer comment: pt benefits from cues for hand placement and to increase anterior weight shift    Ambulation/Gait Ambulation/Gait assistance: Min assist Gait Distance (Feet): 5 Feet (5' x 2 trials) Assistive device: Rolling walker (2 wheels) Gait Pattern/deviations: Step-to pattern, Shuffle, Trunk flexed Gait velocity: reduced Gait velocity interpretation: <1.31 ft/sec, indicative of household ambulator   General Gait Details: pt with slowed shuffling steps, trunk flexed over RW which she reports is baseline due to chronic back pain   Stairs             Wheelchair Mobility     Tilt Bed    Modified Rankin (Stroke Patients Only) Modified Rankin (Stroke Patients Only) Pre-Morbid Rankin Score: Slight disability Modified Rankin: Moderately severe disability     Balance Overall balance assessment: Needs assistance Sitting-balance support: No upper extremity supported, Feet supported Sitting balance-Leahy Scale: Fair     Standing balance support: Bilateral upper extremity supported, Reliant on assistive device for balance Standing balance-Leahy Scale: Poor                              Communication Communication Communication: Impaired Factors Affecting Communication: Difficulty expressing self  Cognition Arousal: Alert Behavior During Therapy: Flat affect   PT - Cognitive impairments: Awareness, Memory, Attention, Initiation, Sequencing, Problem solving, Safety/Judgement                       PT - Cognition Comments: very slowed processing, L inattention Following commands: Impaired Following commands impaired: Follows  one step commands with increased time    Cueing Cueing Techniques: Verbal cues, Tactile cues, Visual cues  Exercises      General Comments General comments (skin  integrity, edema, etc.): VSS on RA      Pertinent Vitals/Pain Pain Assessment Pain Assessment: Faces Faces Pain Scale: Hurts little more Pain Location: back Pain Descriptors / Indicators: Aching Pain Intervention(s): Monitored during session    Home Living                          Prior Function            PT Goals (current goals can now be found in the care plan section) Acute Rehab PT Goals Patient Stated Goal: home Progress towards PT goals: Progressing toward goals    Frequency    Min 4X/week      PT Plan      Co-evaluation              AM-PAC PT 6 Clicks Mobility   Outcome Measure  Help needed turning from your back to your side while in a flat bed without using bedrails?: A Little Help needed moving from lying on your back to sitting on the side of a flat bed without using bedrails?: A Little Help needed moving to and from a bed to a chair (including a wheelchair)?: A Little Help needed standing up from a chair using your arms (e.g., wheelchair or bedside chair)?: A Little Help needed to walk in hospital room?: Total Help needed climbing 3-5 steps with a railing? : Total 6 Click Score: 14    End of Session Equipment Utilized During Treatment: Gait belt Activity Tolerance: Patient tolerated treatment well Patient left: with call bell/phone within reach;with chair alarm set Nurse Communication: Mobility status PT Visit Diagnosis: Unsteadiness on feet (R26.81);Muscle weakness (generalized) (M62.81);Difficulty in walking, not elsewhere classified (R26.2);Other symptoms and signs involving the nervous system (R29.898)     Time: 9084-9059 PT Time Calculation (min) (ACUTE ONLY): 25 min  Charges:    $Gait Training: 8-22 mins $Therapeutic Activity: 8-22 mins PT General Charges $$ ACUTE PT VISIT: 1 Visit                     Bernardino JINNY Ruth, PT, DPT Acute Rehabilitation Office [phone]    Bernardino JINNY Ruth 05/30/2024, 9:52 AM

## 2024-05-30 NOTE — Progress Notes (Signed)
 Occupational Therapy Treatment Patient Details Name: Donna Petty MRN: [id number] DOB: 12-13-1935 Today's Date: 05/30/2024   History of present illness Donna Petty is a 88 y.o. female who was at home with her daughter when she had sudden onset left-sided symptoms 10/21. 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 MRI with large acute right PCA infarct and additional scattered small acute bilateral cerebral and cerebellar infarcts. PMH: hypothyroidism, macular degeneration, carotid artery disease, depression   OT comments  Patient demonstrating good gains with OT treatment with bed mobility, transfers, grooming, and self feeding. Patient demonstrating gains with visual scanning but continues to require cues.  Patient will benefit from intensive inpatient follow-up therapy, >3 hours/day.  Acute OT to continue to follow to address established goals to facilitate DC to next venue of care.        If plan is discharge home, recommend the following:  Two people to help with walking and/or transfers;Two people to help with bathing/dressing/bathroom   Equipment Recommendations  Wheelchair (measurements OT);Wheelchair cushion (measurements OT);BSC/3in1    Recommendations for Other Services      Precautions / Restrictions Precautions Precautions: Fall Recall of Precautions/Restrictions: Impaired Precaution/Restrictions Comments: L neglect, SBP <180 Restrictions Weight Bearing Restrictions Per Provider Order: No       Mobility Bed Mobility Overal bed mobility: Needs Assistance Bed Mobility: Supine to Sit     Supine to sit: Mod assist, HOB elevated     General bed mobility comments: verbal cues and mod assist due to assistance needed with trunk    Transfers Overall transfer level: Needs assistance Equipment used: Ambulation equipment used Transfers: Sit to/from Stand, Bed to chair/wheelchair/BSC Sit to Stand: Mod assist, Min assist            General transfer comment: mod assist to stand from EOB into Stedy and min assist from [employer] via Lift Equipment: Stedy   Balance Overall balance assessment: Needs assistance Sitting-balance support: Single extremity supported, Feet supported Sitting balance-Leahy Scale: Poor Sitting balance - Comments: CGA on EOB   Standing balance support: Bilateral upper extremity supported, During functional activity, Reliant on assistive device for balance Standing balance-Leahy Scale: Poor Standing balance comment: reliant on Stedy for support                           ADL either performed or assessed with clinical judgement   ADL Overall ADL's : Needs assistance/impaired Eating/Feeding: Set up;Sitting Eating/Feeding Details (indicate cue type and reason): assistance with opening containers Grooming: Wash/dry hands;Wash/dry face;Sitting;Supervision/safety;Cueing for sequencing                                      Extremity/Trunk Assessment              Vision   Additional Comments: able to locate 3/3 items on left with min verbal cues   Perception Perception Perception: Impaired Preception Impairment Details: Inattention/Neglect   Praxis     Communication Communication Communication: Impaired Factors Affecting Communication: Difficulty expressing self;Reduced clarity of speech   Cognition Arousal: Alert Behavior During Therapy: Flat affect Cognition: Cognition impaired     Awareness: Intellectual awareness intact, Online awareness impaired     Executive functioning impairment (select all impairments): Initiation OT - Cognition Comments: cues to adhere to left  Following commands: Impaired Following commands impaired: Follows one step commands inconsistently      Cueing   Cueing Techniques: Verbal cues, Gestural cues, Tactile cues, Visual cues  Exercises Exercises: Other exercises Other Exercises Other  Exercises: visual scanning tasks with patient able to locate 3/3 items to left with min verbal cues Other Exercises: LUE reaching tasks incorporating visual scanning    Shoulder Instructions       General Comments VSS on RA    Pertinent Vitals/ Pain       Pain Assessment Pain Assessment: Faces Faces Pain Scale: Hurts little more Pain Location: back (chronic) Pain Descriptors / Indicators: Discomfort, Grimacing, Guarding, Sore Pain Intervention(s): Limited activity within patient's tolerance, Monitored during session, Repositioned  Home Living                                          Prior Functioning/Environment              Frequency  Min 2X/week        Progress Toward Goals  OT Goals(current goals can now be found in the care plan section)  Progress towards OT goals: Progressing toward goals  Acute Rehab OT Goals Patient Stated Goal: none stated OT Goal Formulation: With patient Time For Goal Achievement: 06/08/24 Potential to Achieve Goals: Good ADL Goals Pt Will Perform Eating: with min assist;sitting Pt Will Perform Grooming: with min assist;sitting Pt Will Perform Upper Body Bathing: with min assist;sitting Pt Will Transfer to Toilet: with +2 assist;with mod assist;stand pivot transfer Additional ADL Goal #1: pt will locate 3 objects in L visual field with min Cues  Plan      Co-evaluation                 AM-PAC OT 6 Clicks Daily Activity     Outcome Measure   Help from another person eating meals?: A Little Help from another person taking care of personal grooming?: A Lot Help from another person toileting, which includes using toliet, bedpan, or urinal?: Total Help from another person bathing (including washing, rinsing, drying)?: A Lot Help from another person to put on and taking off regular upper body clothing?: A Lot Help from another person to put on and taking off regular lower body clothing?: Total 6 Click  Score: 11    End of Session Equipment Utilized During Treatment: Gait belt;Other (comment) Laurent)  OT Visit Diagnosis: Unsteadiness on feet (R26.81);Muscle weakness (generalized) (M62.81);Hemiplegia and hemiparesis Hemiplegia - Right/Left: Left Hemiplegia - dominant/non-dominant: Non-Dominant Hemiplegia - caused by: Cerebral infarction   Activity Tolerance Patient tolerated treatment well   Patient Left in chair;with call bell/phone within reach;with chair alarm set   Nurse Communication Mobility status;Need for lift equipment        Time: 9188-9157 OT Time Calculation (min): 31 min  Charges: OT General Charges $OT Visit: 1 Visit OT Treatments $Self Care/Home Management : 8-22 mins $Therapeutic Activity: 8-22 mins  Dick Laine, OTA Acute Rehabilitation Services  Office [phone]   Jeb LITTIE Laine 05/30/2024, 8:50 AM

## 2024-05-30 NOTE — Progress Notes (Signed)
 Speech Language Pathology Treatment: Dysphagia;Cognitive-Linguistic  Patient Details Name: Donna Petty MRN: [id number] DOB: October 04, 1935 Today's Date: 05/30/2024 Time: 9050-8990 SLP Time Calculation (min) (ACUTE ONLY): 20 min  Assessment / Plan / Recommendation Clinical Impression  SWALLOWING Pt seen for ongoing dysphagia therapy. She was seated upright in chair on arrival with dentures in place.  She complains of distaste for food.  Today pt tolerated trials of advanced texture solids.  She exhibited good attention to boluses of mechanical soft and regular solids.  There was complete oral clearance of these textures independently.  Pt exhibited no clinical s/s of aspiration with thin liquid by straw or solid textures.  Discussed diet preference with pt who would prefer regular textures over mechanical soft.  Recommend regular texture diet with thin liquids.    COGNITION Pt with improving L neglect.  Today she was able to find 9 of 10 items in room (half on R side and half on L) with cue needed only for one item to far L of patient in line with her and tucked into a corner.  Pt was oriented to time, location, and situation.  She was incorrect with date, but answered correctly with year, month, and DOW.  Pt states her daughter told her she had a stroke, but she doesn't believe her.  She was unable to name any deficits/changes in function.  Pt became frustrated with tasks yelling that people are asking her too many questions.  No further tasks attempt with pt at this time.  Recommend continued ST in house and at next level of care.  Pt may benefit from intensive IP rehab if she is able to tolerate therapy times.    HPI HPI: Patient is an 88 y.o. female with PMH: dementia, macular degeneration, hypothyroidism, CAD, depression. She lives with her daughter at baseline. She presented to the hospital on 05/24/24 after sudden onset left sided symptoms, leaning to the left and had right gaze deviation. In  ED, she was hypertensive with left hemianopsia with left sided extinction. CT negative for large territory infarct or hemorrhage. MRI brain showed large left right PCA infarct, additional scattered small acute bilateral cerebral and cerebellar infarcts, moderate cerebral atrophy. TNK administered but unable to do thrombectomy.      SLP Plan  Continue with current plan of care          Recommendations  Diet recommendations: Regular;Thin liquid Liquids provided via: Straw;Cup Medication Administration: Whole meds with liquid Supervision: Staff to assist with self feeding Compensations: Slow rate;Small sips/bites Postural Changes and/or Swallow Maneuvers: Seated upright 90 degrees;Upright 30-60 min after meal                  Oral care BID;Staff/trained caregiver to provide oral care     Dysphagia, unspecified (R13.10);Cognitive communication deficit (R41.841)     Continue with current plan of care     Anette FORBES Grippe , MA, CCC-SLP Acute Rehabilitation Services Office: [phone] 05/30/2024, 10:20 AM

## 2024-05-30 NOTE — Progress Notes (Addendum)
 STROKE TEAM PROGRESS NOTE    SIGNIFICANT HOSPITAL EVENTS   INTERIM HISTORY/SUBJECTIVE No family at bedside.  Patient lying in bed, eyes closed, not cooperative with exam, however intermittently stated that  I want to go home now.  PT OT recommend CIR, however, the IR coordinator recommend SNF given patient not quite cooperative with therapy.   OBJECTIVE  CBC    Component Value Date/Time   WBC 6.7 05/30/2024 0133   RBC 4.06 05/30/2024 0133   HGB 12.2 05/30/2024 0133   HCT 37.2 05/30/2024 0133   PLT 281 05/30/2024 0133   MCV 91.6 05/30/2024 0133   MCH 30.0 05/30/2024 0133   MCHC 32.8 05/30/2024 0133   RDW 14.8 05/30/2024 0133   LYMPHSABS 2.2 05/24/2024 1056   MONOABS 0.7 05/24/2024 1056   EOSABS 0.2 05/24/2024 1056   BASOSABS 0.0 05/24/2024 1056    BMET    Component Value Date/Time   NA 140 05/30/2024 0133   K 3.7 05/30/2024 0133   CL 106 05/30/2024 0133   CO2 23 05/30/2024 0133   GLUCOSE 89 05/30/2024 0133   BUN 15 05/30/2024 0133   CREATININE 1.38 (H) 05/30/2024 0133   CALCIUM  8.6 (L) 05/30/2024 0133   GFRNONAA 37 (L) 05/30/2024 0133    IMAGING past 24 hours CT HEAD WO CONTRAST ( ) Result Date: 05/29/2024 EXAM: CT HEAD WITHOUT CONTRAST 05/29/2024 04:29:21 PM TECHNIQUE: CT of the head was performed without the administration of intravenous contrast. Automated exposure control, iterative reconstruction, and/or weight based adjustment of the mA/kV was utilized to reduce the radiation dose to as low as reasonably achievable. COMPARISON: None available. CLINICAL HISTORY: Stroke, follow up. FINDINGS: BRAIN AND VENTRICLES: No acute hemorrhage. Evolving right PCA territory infarct. No evidence of acute hemorrhage or progressive mass effect. Remote right basal ganglia lacunar infarct. Small remote right greater than left cerebellar infarcts. No hydrocephalus. No extra-axial collection. No midline shift. ORBITS: No acute abnormality. SINUSES: No acute abnormality. SOFT  TISSUES AND SKULL: No acute soft tissue abnormality. No skull fracture. IMPRESSION: 1. Evolving right PCA territory infarct. No evidence of acute hemorrhage or progressive mass effect. Electronically signed by: Gilmore Molt MD 05/29/2024 05:45 PM EDT RP Workstation: [id number]    Vitals:   05/29/24 2317 05/30/24 0313 05/30/24 0321 05/30/24 0836  BP:  (!) 108/49 (!) 108/49 (!) 153/92  Pulse:   62 69  Resp: 18  16 20   Temp:   98 F (36.7 C) (!) 97.5 F (36.4 C)  TempSrc:   Axillary Oral  SpO2:   95% 97%  Weight:         PHYSICAL EXAM General: Ill-appearing elderly patient in no acute distress Psych:  flat CV: Regular rate and rhythm on monitor Respiratory:  Regular, unlabored respirations on room air     NEURO:  Mental Status: awake and alert. No dysarthria or aphasia. Minimal words. Will not cooperate with exam    Cranial Nerves:  II: PERRL.  Left hemianopsia III, IV, VI: Right gaze deviation, unable to cross midline V: Sensation is intact to light touch and diminished on the left VII: Face is symmetrical resting and smiling VIII: Hearing intact to voice. IX, X: Phonation is normal XII: Tongue protrudes midline Motor: Moves all 4 extremities, upper extremities with antigravity strength, appears to be moving lowers equally but not cooperative with exam  Tone: is normal and bulk is normal Sensation- Intact to light touch on the right but absent in the left arm and diminished in the left leg Gait-  deferred   Most Recent NIH  1a Level of Conscious.: 0 1b LOC Questions: 0 1c LOC Commands: 0 2 Best Gaze: 2 3 Visual: 2 4 Facial Palsy: 0 5a Motor Arm - left: 2 5b Motor Arm - Right: 0 6a Motor Leg - Left: 0 6b Motor Leg - Right: 0 7 Limb Ataxia: 0 8 Sensory: 1 9 Best Language: 0 10 Dysarthria: 1 11 Extinct. and Inatten.: 0 TOTAL: 8   ASSESSMENT/PLAN [name] is a 88 y.o. female with history of hypothyroidism, macular degeneration and depression admitted  for left hemianopsia, right gaze deviation and left hemiparesis.  She was found to have a right PCA P1 occlusion, and mechanical thrombectomy was attempted.  Unfortunately, this procedure was unsuccessful due to extreme tortuosity of the vertebral artery. MRI reveals large acute right PCA infarct and a conditional scattered small bilateral acute cerebral and cerebellar infarcts. NIH on Admission 7   Stroke: Right PCA large infarct with multiple small scattered infarcts in bilateral hemispheres with R P1 occlusion s/p IR but unsuccessful, etiology: cardioembolic from new diagnosed atrial flutter   code Stroke CT head No acute abnormality. Small vessel disease. ASPECTS 10.    CTA head & neck right PCA P1 segment occlusion, right ICA origin plaque with 60% stenosis, bilateral cervical ICA tortuosity and possible FMD, moderate left ICA siphon stenosis, mild right M2 branch stenoses S/p IR but unsuccessful due to extreme tortuous VA MRI large acute right PCA infarct, additional scattered small acute bilateral cerebral and cerebellar infarcts, moderate cerebral atrophy and chronic small vessel ischemic disease CT repeat R large PCA infarct stable, no hemorrahge 2D Echo EF 60-65%, LA normal  LDL 95 HgbA1c 5.4 VTE prophylaxis - SQ heparin No antithrombotic prior to admission, now on eliquis  Therapy recommendations: SNF  Disposition: Pending   A- flutter New diagnosis Cardiology consulted Replace K  On Eliquis    Anxiety and agitation Vascular dementia Started Xanax 0.25 mg tabled BID PRN for anxiety  Start Seroquel 12.5 mg twice daily Off b/l hand mittens On home nemanda and Remeron  Pt behave better when family at the bedside   AKI Cre 1.40--1.05--1.06--1.25-1.38 Likely due to decreased p.o. intake Will encourage po intake Put on IV fluid  Hypertension Home meds: None Stable Keep systolic blood pressure less than 180   Hyperlipidemia Home meds: None LDL 95, goal < 70 Add  atorvastatin 20 mg daily Continue statin at discharge   Dysphagia Patient has post-stroke dysphagia, SLP  On dysphagia 2 and thin liquid Advance diet as tolerated   Other Stroke Risk Factors Obesity, Body mass index is 31.72 kg/m., BMI >/= 30 associated with increased stroke risk, recommend weight loss, diet and exercise as appropriate    Other Active Problems   Hospital day # 6   Karna Geralds DNP, ACNPC-AG  [facility]   ATTENDING NOTE: I reviewed above note and agree with the assessment and plan. Pt was seen and examined.   No family at bedside.  Patient eyes closed not cooperative with exam, asking for going home.  Now therapy recommend SNF.  Continue current management.  However patient decreased p.o. intake, creatinine increased, put on IV fluid.  For detailed assessment and plan, please refer to above as I have made changes wherever appropriate.   Ary Cummins, MD PhD Stroke Neurology 05/30/2024 5:08 PM    To contact Stroke Continuity provider, please refer to [email or web address]. After hours, contact General Neurology

## 2024-05-30 NOTE — Consult Note (Addendum)
 Physical Medicine and Rehabilitation Consult Reason for Consult: Stroke Referring Physician: Jerri   HPI: Donna Petty is a 88 y.o. female with macular degeneration, carotid artery disease, depression who developed left-sided weakness and was brought to the ED as a code stroke.  Blood pressure was elevated at 190 systolic at the ED.  Patient received TNK and underwent right PCA P1 thrombectomy.  The PCA thrombectomy was unsuccessful due to tortuosity of the right P1.  MRI showed the right PCA infarct as well as scattered small bilateral acute cerebral and cerebellar infarcts. The patient underwent PT evaluation plus to max assist for mobility.  +2 total for transfers PT note 05/27/2024, mod assist transfers Speech evaluation, initially NPO except for medications. OT eval initial max to total assist for upper and lower body dressing Most recent OT notes 05/30/2024 sit to stand mod assist, ADLs not performed Cardiology evaluation 10/24 for atrial flutter onset 10/22, recommended Eliquis when okay with neurology.  Palliative care consult was obtained on 05/27/2024, limited DNR Neurology notes indicate patient has had some agitation and confusion, most recent NIH score of 8 Home: Home Living Family/patient expects to be discharged to:: Private residence Living Arrangements: Children Available Help at Discharge: Family Type of Home: House Home Access: Stairs to enter Secretary/administrator of Steps: 5 Entrance Stairs-Rails: Right, Left, Can reach both Home Layout: One level Bathroom Shower/Tub: Engineer, Manufacturing Systems: Standard Bathroom Accessibility: Yes Home Equipment: Agricultural Consultant (2 wheels), Rollator (4 wheels), Cane - single point Additional Comments: has 2 cats Bae and simon  Functional History: Prior Function Prior Level of Function : Independent/Modified Independent Mobility Comments: rollator for grocery shopping, no AD in the house ADLs Comments: indep with  bathing, pt does how work like making her bed and cleaning the litter box Functional Status:  Mobility: Bed Mobility Overal bed mobility: Needs Assistance Bed Mobility: Supine to Sit Rolling: Max assist, +2 for physical assistance Sidelying to sit: Max assist, +2 for physical assistance Supine to sit: Mod assist, HOB elevated Sit to supine: Max assist, +2 for physical assistance General bed mobility comments: verbal cues and mod assist due to assistance needed with trunk Transfers Overall transfer level: Needs assistance Equipment used: Ambulation equipment used Transfers: Sit to/from Stand, Bed to chair/wheelchair/BSC Sit to Stand: Mod assist, Min assist Bed to/from chair/wheelchair/BSC transfer type:: Via Lift equipment Transfer via Lift Equipment: [employer] transfer comment: mod assist to stand from EOB into Stedy and min assist from [employer] co Ambulation/Gait General Gait Details: unable    ADL: ADL Overall ADL's : Needs assistance/impaired Eating/Feeding: Set up, Sitting Eating/Feeding Details (indicate cue type and reason): assistance with opening containers Grooming: Wash/dry hands, Wash/dry face, Sitting, Supervision/safety, Cueing for sequencing Upper Body Bathing: Maximal assistance, Bed level Lower Body Bathing: Total assistance Upper Body Dressing : Maximal assistance Lower Body Dressing: Total assistance Toilet Transfer: Maximal assistance, +2 for physical assistance, +2 for safety/equipment (sit <>stand)  Cognition: Cognition Overall Cognitive Status: Within Functional Limits for tasks assessed Arousal/Alertness: Awake/alert Orientation Level: Oriented to person, Oriented to place, Oriented to time, Oriented to situation Year: 2025 Month: October Day of Week: Incorrect Attention: Sustained Sustained Attention: Appears intact Awareness: Impaired Awareness Impairment: Intellectual impairment, Emergent impairment, Anticipatory impairment Safety/Judgment:  Impaired Cognition Arousal: Alert Behavior During Therapy: Flat affect Overall Cognitive Status: Within Functional Limits for tasks assessed   Review of Systems  Constitutional:  Negative for chills and fever.  HENT:  Positive for hearing loss.  Eyes:  Negative for discharge and redness.  Respiratory:  Negative for cough, sputum production, shortness of breath and stridor.   Cardiovascular:  Negative for chest pain.  Gastrointestinal:  Negative for heartburn, nausea and vomiting.  Genitourinary:  Negative for frequency.  Musculoskeletal:  Negative for back pain and joint pain.  Skin:  Negative for itching.   Past Medical History:  Diagnosis Date   Acid reflux    Arthritis    Carotid artery disease    50-70% left internal carotid artery stenosis   Depression    Dyspnea    Hypothyroidism    Macular degeneration    Thyroid  condition    Visual changes    Past Surgical History:  Procedure Laterality Date   CATARACT EXTRACTION, BILATERAL     CHOLECYSTECTOMY     COLONOSCOPY     ESOPHAGOGASTRODUODENOSCOPY ENDOSCOPY     IR ANGIO VERTEBRAL SEL SUBCLAVIAN INNOMINATE UNI R MOD SED  05/24/2024   IR PERCUTANEOUS ART THROMBECTOMY/INFUSION INTRACRANIAL INC DIAG ANGIO  05/24/2024   IR US  GUIDE VASC ACCESS RIGHT  05/24/2024   RADIOLOGY WITH ANESTHESIA N/A 05/24/2024   Procedure: RADIOLOGY WITH ANESTHESIA;  Surgeon: Radiologist, Medication, MD;  Location: [facility] OR;  Service: Radiology;  Laterality: N/A;   TOTAL HIP ARTHROPLASTY Left 01/26/2024   Procedure: ARTHROPLASTY, HIP, TOTAL, ANTERIOR APPROACH;  Surgeon: Ernie Cough, MD;  Location: [facility];  Service: Orthopedics;  Laterality: Left;   TOTAL KNEE ARTHROPLASTY Left 06/26/2020   Procedure: TOTAL KNEE ARTHROPLASTY;  Surgeon: Ernie Cough, MD;  Location: [facility];  Service: Orthopedics;  Laterality: Left;  70 mins   Family History  Problem Relation Age of Onset   Hypertension Father    Social History:  reports that she has never smoked.  She has never used smokeless tobacco. She reports that she does not drink alcohol and does not use drugs. Allergies: No Known Allergies Medications Prior to Admission  Medication Sig Dispense Refill   ADVIL 200 MG CAPS Take 200-400 mg by mouth 2 (two) times daily as needed (for pain).     Calcium -Vitamin D 600-200 MG-UNIT per tablet Take 1 tablet by mouth daily.     cetirizine (ZYRTEC) 10 MG tablet Take 10 mg by mouth daily.     ezetimibe  (ZETIA ) 10 MG tablet Take 10 mg by mouth every other day.     furosemide  (LASIX ) 40 MG tablet Take 40 mg by mouth daily.     glucosamine-chondroitin 500-400 MG tablet Take 1 tablet by mouth daily.     ketoconazole (NIZORAL) 2 % cream Apply 1 Application topically 2 (two) times daily as needed for irritation.     levothyroxine  (SYNTHROID ) 100 MCG tablet Take 100 mcg by mouth daily before breakfast.     memantine  (NAMENDA ) 5 MG tablet Take 5 mg by mouth 2 (two) times daily.     mirtazapine  (REMERON ) 15 MG tablet Take 15 mg by mouth at bedtime.     Multiple Vitamin (MULTIVITAMIN WITH MINERALS) TABS tablet Take 1 tablet by mouth daily.     Multiple Vitamins-Minerals (ICAPS AREDS 2 PO) Take 1 capsule by mouth 2 (two) times daily.     omeprazole  (PRILOSEC) 40 MG capsule Take 40 mg by mouth daily before breakfast.     potassium chloride  (KLOR-CON  M) 10 MEQ tablet Take 5 mEq by mouth every evening.     TYLENOL  500 MG tablet Take 500 mg by mouth 2 (two) times daily as needed (for pain).       Blood  pressure (!) 153/92, pulse 69, temperature (!) 97.5 F (36.4 C), temperature source Oral, resp. rate 20, weight 77.4 kg, SpO2 97%. Physical Exam Vitals and nursing note reviewed.  Constitutional:      Appearance: She is obese.  HENT:     Head: Normocephalic and atraumatic.     Mouth/Throat:     Mouth: Mucous membranes are moist.  Cardiovascular:     Rate and Rhythm: Normal rate. Rhythm irregular.  Pulmonary:     Breath sounds: No stridor.  Abdominal:      General: Abdomen is flat. Bowel sounds are normal. There is no distension.     Palpations: Abdomen is soft.     Tenderness: There is no abdominal tenderness.  Skin:    General: Skin is warm and dry.  Neurological:     Mental Status: She is alert.     Comments: I am mean and you ask too many questions   Speech without dysarthria or aphasia noted. Poor eye contact. Will answer questions with one-word answers. Cooperated with upper extremity MMT but not lower extremity except for hip flexors, then she said that was too much Motor strength is 5/5 bilateral deltoid bicep tricep to right grip 4/5 left grip 5/5 right hip flexor and 4/5 left hip flexor, will wiggle ankles up and down but did not cooperate with remainder of lower extremity manual muscle testing. Sensation reported as equal to light touch in the upper limbs In the lower limbs she was able to sense light touch in the left great toe she did not answer correctly on the left little toe and also did not answer correctly on the right side but there may be an attentional component. Gait not tested Left visual spatial neglect noted     Results for orders placed or performed during the hospital encounter of 05/24/24 (from the past 24 hours)  Basic metabolic panel with GFR     Status: Abnormal   Collection Time: 05/30/24  1:33 AM  Result Value Ref Range   Sodium 140 135 - 145 mmol/L   Potassium 3.7 3.5 - 5.1 mmol/L   Chloride 106 98 - 111 mmol/L   CO2 23 22 - 32 mmol/L   Glucose, Bld 89 70 - 99 mg/dL   BUN 15 8 - 23 mg/dL   Creatinine, Ser 8.61 (H) 0.44 - 1.00 mg/dL   Calcium  8.6 (L) 8.9 - 10.3 mg/dL   GFR, Estimated 37 (L) >60 mL/min   Anion gap 11 5 - 15  CBC     Status: None   Collection Time: 05/30/24  1:33 AM  Result Value Ref Range   WBC 6.7 4.0 - 10.5 K/uL   RBC 4.06 3.87 - 5.11 MIL/uL   Hemoglobin 12.2 12.0 - 15.0 g/dL   HCT 62.7 63.9 - 53.9 %   MCV 91.6 80.0 - 100.0 fL   MCH 30.0 26.0 - 34.0 pg   MCHC 32.8 30.0 -  36.0 g/dL   RDW 85.1 88.4 - 84.4 %   Platelets 281 150 - 400 K/uL   nRBC 0.0 0.0 - 0.2 %   CT HEAD WO CONTRAST ( ) Result Date: 05/29/2024 EXAM: CT HEAD WITHOUT CONTRAST 05/29/2024 04:29:21 PM TECHNIQUE: CT of the head was performed without the administration of intravenous contrast. Automated exposure control, iterative reconstruction, and/or weight based adjustment of the mA/kV was utilized to reduce the radiation dose to as low as reasonably achievable. COMPARISON: None available. CLINICAL HISTORY: Stroke, follow up. FINDINGS: BRAIN AND VENTRICLES:  No acute hemorrhage. Evolving right PCA territory infarct. No evidence of acute hemorrhage or progressive mass effect. Remote right basal ganglia lacunar infarct. Small remote right greater than left cerebellar infarcts. No hydrocephalus. No extra-axial collection. No midline shift. ORBITS: No acute abnormality. SINUSES: No acute abnormality. SOFT TISSUES AND SKULL: No acute soft tissue abnormality. No skull fracture. IMPRESSION: 1. Evolving right PCA territory infarct. No evidence of acute hemorrhage or progressive mass effect. Electronically signed by: Gilmore Molt MD 05/29/2024 05:45 PM EDT RP Workstation: [id number]    Assessment/Plan: Diagnosis: Right posterior cerebral artery infarct Does the need for close, 24 hr/day medical supervision in concert with the patient's rehab needs make it unreasonable for this patient to be served in a less intensive setting? Potentially Co-Morbidities requiring supervision/potential complications:  - CKD 3A, hypertension, history of GI bleed, new onset atrial ablation/flutter Due to bladder management, bowel management, safety, skin/wound care, disease management, medication administration, pain management, and patient education, does the patient require 24 hr/day rehab nursing? Potentially Does the patient require coordinated care of a physician, rehab nurse, therapy disciplines of PT, OT, speech to address  physical and functional deficits in the context of the above medical diagnosis(es)? Potentially Addressing deficits in the following areas: balance, endurance, locomotion, strength, transferring, bowel/bladder control, bathing, dressing, feeding, grooming, toileting, cognition, and psychosocial support Can the patient actively participate in an intensive therapy program of at least 3 hrs of therapy per day at least 5 days per week? Patient easily frustrated, even refused portions of the manual muscle testing as being too many questions The potential for patient to make measurable gains while on inpatient rehab is poor Anticipated functional outcomes upon discharge from inpatient rehab are n/a  with PT, n/a with OT, n/a with SLP. Estimated rehab length of stay to reach the above functional goals is: Not applicable Anticipated discharge destination: SNF versus home with 24 7 supervision Overall Rehab/Functional Prognosis: fair  POST ACUTE RECOMMENDATIONS: This patient's condition is appropriate for continued rehabilitative care in the following setting: SNF Patient has agreed to participate in recommended program. N/A Note that insurance prior authorization may be required for reimbursement for recommended care.  Comment: Do not think patient would cooperate with an intensive inpatient rehabilitation program   MEDICAL RECOMMENDATIONS:    I have personally performed a face to face diagnostic evaluation of this patient. Additionally, I have examined the patient's medical record including any pertinent labs and radiographic images.    Thanks,  Prentice FORBES Compton, MD 05/30/2024

## 2024-05-30 NOTE — Discharge Instructions (Signed)

## 2024-05-31 DIAGNOSIS — I739 Peripheral vascular disease, unspecified: Secondary | ICD-10-CM | POA: Diagnosis not present

## 2024-05-31 DIAGNOSIS — R29708 NIHSS score 8: Secondary | ICD-10-CM | POA: Diagnosis not present

## 2024-05-31 DIAGNOSIS — I63431 Cerebral infarction due to embolism of right posterior cerebral artery: Secondary | ICD-10-CM | POA: Diagnosis not present

## 2024-05-31 DIAGNOSIS — I4892 Unspecified atrial flutter: Secondary | ICD-10-CM | POA: Diagnosis not present

## 2024-05-31 LAB — BASIC METABOLIC PANEL WITH GFR
Anion gap: 10 (ref 5–15)
BUN: 16 mg/dL (ref 8–23)
CO2: 22 mmol/L (ref 22–32)
Calcium: 8.3 mg/dL — ABNORMAL LOW (ref 8.9–10.3)
Chloride: 105 mmol/L (ref 98–111)
Creatinine, Ser: 1.3 mg/dL — ABNORMAL HIGH (ref 0.44–1.00)
GFR, Estimated: 40 mL/min — ABNORMAL LOW (ref >60)
Glucose, Bld: 116 mg/dL — ABNORMAL HIGH (ref 70–99)
Potassium: 3.6 mmol/L (ref 3.5–5.1)
Sodium: 137 mmol/L (ref 135–145)

## 2024-05-31 LAB — CBC
HCT: 36.7 % (ref 36.0–46.0)
Hemoglobin: 12.2 g/dL (ref 12.0–15.0)
MCH: 30.1 pg (ref 26.0–34.0)
MCHC: 33.2 g/dL (ref 30.0–36.0)
MCV: 90.6 fL (ref 80.0–100.0)
Platelets: 279 K/uL (ref 150–400)
RBC: 4.05 MIL/uL (ref 3.87–5.11)
RDW: 14.9 % (ref 11.5–15.5)
WBC: 6.7 K/uL (ref 4.0–10.5)
nRBC: 0 % (ref 0.0–0.2)

## 2024-05-31 MED ORDER — QUETIAPINE FUMARATE 25 MG PO TABS
12.5000 mg | ORAL_TABLET | Freq: Every day | ORAL | Status: DC
Start: 2024-06-01 — End: 2024-06-02
  Administered 2024-06-01: 12.5 mg via ORAL
  Filled 2024-05-31: qty 1

## 2024-05-31 NOTE — Progress Notes (Signed)
 [place] PHYSICAL MEDICINE AND REHABILITATION  CONSULT SERVICE NOTE    88 yo female with right PCA infarct. Pt is participating in therapies and has some awareness that she needs to improve her functional mobility before she goes home. Since she is participating and in favor of an inpatient rehab stay, the rehab service will change recommendations from SNF to inpatient rehab. ELOS 7-10 days, goals mod I to occasional supervision.   Donna IVAR Gunther, MD, [facility] Physical Medicine & Rehabilitation Medical Director Rehabilitation Services 05/31/2024

## 2024-05-31 NOTE — Plan of Care (Signed)
 General Condition seems fair. On chest auscultation during initial assessment crackles is noticed on Bilateral lower lobes , verified done by RN Kathrine but pt has no any signs of respiratory distress and repeated at 12 pm found the same assessment. Inform to the cardologist.  Problem: Education: Goal: Knowledge of disease or condition will improve Outcome: Progressing   Problem: Education: Goal: Knowledge of secondary prevention will improve (MUST DOCUMENT ALL) Outcome: Progressing   Problem: Education: Goal: Knowledge of patient specific risk factors will improve (DELETE if not current risk factor) Outcome: Progressing   Problem: Ischemic Stroke/TIA Tissue Perfusion: Goal: Complications of ischemic stroke/TIA will be minimized Outcome: Progressing   Problem: Coping: Goal: Will verbalize positive feelings about self Outcome: Progressing   Problem: Coping: Goal: Will identify appropriate support needs Outcome: Progressing   Problem: Nutrition: Goal: Dietary intake will improve Outcome: Progressing   Problem: Nutrition: Goal: Risk of aspiration will decrease Outcome: Progressing   Problem: Safety: Goal: Ability to remain free from injury will improve Outcome: Progressing   Problem: Skin Integrity: Goal: Risk for impaired skin integrity will decrease Outcome: Progressing   Problem: Coping: Goal: Level of anxiety will decrease Outcome: Progressing   Problem: Pain Managment: Goal: General experience of comfort will improve and/or be controlled Outcome: Progressing

## 2024-05-31 NOTE — Progress Notes (Signed)
 Physical Therapy Treatment Patient Details Name: Donna Petty MRN: [id number] DOB: 21-Nov-1935 Today's Date: 05/31/2024   History of Present Illness Donna Petty is a 88 y.o. female who was at home with her daughter when she had sudden onset left-sided symptoms 10/21. 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 MRI with large acute right PCA infarct and additional scattered small acute bilateral cerebral and cerebellar infarcts. PMH: hypothyroidism, macular degeneration, carotid artery disease, depression    PT Comments  Pt received in supine and agreeable to session. Pt requires increased time and cues for responses and initiation of tasks throughout session due to slow processing. Pt requires up to mod A for bed mobility and transfers due to weakness and LBP. Pt maintains flexed posture in standing and limited standing tolerance. Pt able to pivot to the recliner, but only maintains standing during 2 successive STS trials for <2 seconds due to pain. Pt continues to benefit from PT services to progress toward functional mobility goals.     If plan is discharge home, recommend the following: A little help with bathing/dressing/bathroom;Supervision due to cognitive status;Help with stairs or ramp for entrance;Assist for transportation;Assistance with cooking/housework;Two people to help with walking and/or transfers;Direct supervision/assist for medications management;Direct supervision/assist for financial management   Can travel by private vehicle        Equipment Recommendations  Rolling walker (2 wheels);BSC/3in1    Recommendations for Other Services       Precautions / Restrictions Precautions Precautions: Fall Recall of Precautions/Restrictions: Impaired Precaution/Restrictions Comments: L neglect, SBP <180 Restrictions Weight Bearing Restrictions Per Provider Order: No     Mobility  Bed Mobility Overal bed mobility: Needs Assistance Bed  Mobility: Supine to Sit     Supine to sit: Min assist, Used rails, HOB elevated, Mod assist     General bed mobility comments: increased cues for technique and problem solving. Min A for trunk elevation and mod A to scoot forward at EOB    Transfers Overall transfer level: Needs assistance Equipment used: Rolling walker (2 wheels) Transfers: Sit to/from Stand, Bed to chair/wheelchair/BSC Sit to Stand: Mod assist   Step pivot transfers: Mod assist       General transfer comment: Mod A from EOB x1 and recliner x2 for power up and anterior weight shift. Cues for hand placement. Assist for stability and RW management to pivot to recliner. Pt sitting quickly with decreased control requiring assist once in front of the chair    Ambulation/Gait               General Gait Details: unable due to LBP and short standing tolerance   Stairs             Wheelchair Mobility     Tilt Bed    Modified Rankin (Stroke Patients Only) Modified Rankin (Stroke Patients Only) Pre-Morbid Rankin Score: Slight disability Modified Rankin: Moderately severe disability     Balance Overall balance assessment: Needs assistance Sitting-balance support: No upper extremity supported, Feet supported Sitting balance-Leahy Scale: Fair Sitting balance - Comments: CGA on EOB   Standing balance support: Bilateral upper extremity supported, Reliant on assistive device for balance, During functional activity Standing balance-Leahy Scale: Poor Standing balance comment: reliant on UE support                            Communication Communication Communication: Impaired Factors Affecting Communication: Difficulty expressing self  Cognition Arousal: Alert Behavior During Therapy: Flat affect, Agitated   PT - Cognitive impairments: Awareness, Memory, Attention, Initiation, Sequencing, Problem solving, Safety/Judgement                       PT - Cognition Comments:  increased agitation towards end of session. slow processing and response time. Pt with decreased insight to deficits, stating there is nothing wrong with me despite education Following commands: Impaired Following commands impaired: Follows one step commands with increased time    Cueing Cueing Techniques: Verbal cues, Tactile cues, Visual cues  Exercises      General Comments        Pertinent Vitals/Pain Pain Assessment Pain Assessment: Faces Faces Pain Scale: Hurts little more Pain Location: back Pain Descriptors / Indicators: Aching, Guarding Pain Intervention(s): Monitored during session, Limited activity within patient's tolerance, Repositioned     PT Goals (current goals can now be found in the care plan section) Acute Rehab PT Goals Patient Stated Goal: home PT Goal Formulation: With patient/family Time For Goal Achievement: 06/08/24 Progress towards PT goals: Progressing toward goals    Frequency    Min 4X/week       AM-PAC PT 6 Clicks Mobility   Outcome Measure  Help needed turning from your back to your side while in a flat bed without using bedrails?: A Little Help needed moving from lying on your back to sitting on the side of a flat bed without using bedrails?: A Little Help needed moving to and from a bed to a chair (including a wheelchair)?: A Little Help needed standing up from a chair using your arms (e.g., wheelchair or bedside chair)?: A Lot Help needed to walk in hospital room?: Total Help needed climbing 3-5 steps with a railing? : Total 6 Click Score: 13    End of Session Equipment Utilized During Treatment: Gait belt Activity Tolerance: Patient tolerated treatment well;Patient limited by pain Patient left: with call bell/phone within reach;with chair alarm set;in chair Nurse Communication: Mobility status PT Visit Diagnosis: Unsteadiness on feet (R26.81);Muscle weakness (generalized) (M62.81);Difficulty in walking, not elsewhere  classified (R26.2);Other symptoms and signs involving the nervous system (R29.898)     Time: 8559-8541 PT Time Calculation (min) (ACUTE ONLY): 18 min  Charges:    $Therapeutic Activity: 8-22 mins PT General Charges $$ ACUTE PT VISIT: 1 Visit                     Darryle George, PTA Acute Rehabilitation Services Secure Chat Preferred  [phone]    Darryle George 05/31/2024, 3:49 PM

## 2024-05-31 NOTE — TOC Progression Note (Signed)
 Transition of Care [facility]) - Progression Note    Patient Details  Name: Donna Petty MRN: [id number] Date of Birth: 06/20/1936  [facility] CM/SW Contact  Andrez JULIANNA George, RN Phone Number: 05/31/2024, 11:59 AM  Clinical Narrative:     CIR has started insurance for a rehab admission. IP Care management following.  Expected Discharge Plan: IP Rehab Facility Barriers to Discharge: Continued Medical Work up               Expected Discharge Plan and Services       Living arrangements for the past 2 months: Single Family Home                                       Social Drivers of Health (SDOH) Interventions SDOH Screenings   Food Insecurity: No Food Insecurity (05/25/2024)  Housing: Low Risk  (05/25/2024)  Transportation Needs: No Transportation Needs (05/25/2024)  Utilities: Not At Risk (05/25/2024)  Social Connections: Moderately Integrated (05/25/2024)  Tobacco Use: Low Risk  (01/26/2024)    Readmission Risk Interventions     No data to display

## 2024-05-31 NOTE — Progress Notes (Addendum)
 Inpatient Rehab Admissions Coordinator:    I spoke with Pt.'s daughter yesterday. She wants CIR for Pt. And can care for her afterwards. However, note rehab MD feels she is more appropriate for SNF. I will call family again to discuss.   Leita Kleine, MS, CCC-SLP Rehab Admissions Coordinator  [phone] (celll) [phone] (office)

## 2024-05-31 NOTE — Plan of Care (Signed)
  Problem: Nutrition: Goal: Risk of aspiration will decrease Outcome: Progressing Goal: Dietary intake will improve Outcome: Progressing   Problem: Clinical Measurements: Goal: Ability to maintain clinical measurements within normal limits will improve Outcome: Progressing Goal: Will remain free from infection Outcome: Progressing Goal: Diagnostic test results will improve Outcome: Progressing Goal: Respiratory complications will improve Outcome: Progressing Goal: Cardiovascular complication will be avoided Outcome: Progressing   Problem: Activity: Goal: Risk for activity intolerance will decrease Outcome: Progressing   Problem: Nutrition: Goal: Adequate nutrition will be maintained Outcome: Progressing   Problem: Pain Managment: Goal: General experience of comfort will improve and/or be controlled Outcome: Progressing   Problem: Elimination: Goal: Will not experience complications related to bowel motility Outcome: Progressing Goal: Will not experience complications related to urinary retention Outcome: Progressing   Problem: Safety: Goal: Ability to remain free from injury will improve Outcome: Progressing   Problem: Skin Integrity: Goal: Risk for impaired skin integrity will decrease Outcome: Progressing

## 2024-05-31 NOTE — Progress Notes (Signed)
 Inpatient Rehab Admissions Coordinator:     I spoke with Pt. Regarding CIR again. She expressed that she would like to go home but after a discussion, she states he understands that she can't be independent if she goes home and needs to do some rehab to get back to her normal self. She is amenable to CIR stay. I discussed case with rehab director, Dr. Arthea Gunther and he feels she is an appropriate CIR candidate. I will continue to await decision from insurance.   Leita Kleine, MS, CCC-SLP Rehab Admissions Coordinator  [phone] (celll) [phone] (office)

## 2024-05-31 NOTE — Progress Notes (Addendum)
 STROKE TEAM PROGRESS NOTE    SIGNIFICANT HOSPITAL EVENTS 10/21: Patient admitted with left-sided weakness and right gaze deviation, mechanical thrombectomy attempted for right PCA P1 occlusion but unsuccessful due to extreme vertebral artery tortuosity 10/22- Transfer out of ICU 10/23: - Atrial flutter on monitor, cardiology consulted. - Okay with anticoagulation per cardiology.  Not planning rhythm control.  INTERIM HISTORY/SUBJECTIVE No family at the bedside.  Patient is laying in the bed in no apparent distress.  No new neurological events overnight.  CBC    Component Value Date/Time   WBC 6.7 05/31/2024 0130   RBC 4.05 05/31/2024 0130   HGB 12.2 05/31/2024 0130   HCT 36.7 05/31/2024 0130   PLT 279 05/31/2024 0130   MCV 90.6 05/31/2024 0130   MCH 30.1 05/31/2024 0130   MCHC 33.2 05/31/2024 0130   RDW 14.9 05/31/2024 0130   LYMPHSABS 2.2 05/24/2024 1056   MONOABS 0.7 05/24/2024 1056   EOSABS 0.2 05/24/2024 1056   BASOSABS 0.0 05/24/2024 1056    BMET    Component Value Date/Time   NA 137 05/31/2024 0130   K 3.6 05/31/2024 0130   CL 105 05/31/2024 0130   CO2 22 05/31/2024 0130   GLUCOSE 116 (H) 05/31/2024 0130   BUN 16 05/31/2024 0130   CREATININE 1.30 (H) 05/31/2024 0130   CALCIUM  8.3 (L) 05/31/2024 0130   GFRNONAA 40 (L) 05/31/2024 0130    IMAGING past 24 hours No results found.   Vitals:   05/30/24 2339 05/31/24 0408 05/31/24 0723 05/31/24 1116  BP: (!) 127/49 134/64 (!) 156/51 (!) 150/61  Pulse: 76 70 71 76  Resp: 17  16 16   Temp: 98.6 F (37 C) 98.7 F (37.1 C) 98.4 F (36.9 C) 98.1 F (36.7 C)  TempSrc: Oral Oral Oral Oral  SpO2: 98% 92% 92% 97%  Weight:         PHYSICAL EXAM General: Ill-appearing elderly patient in no acute distress Psych:  flat CV: Regular rate and rhythm on monitor Respiratory:  Regular, unlabored respirations on room air     NEURO:  Mental Status: awake and alert. No dysarthria or aphasia. Minimal words. Will not  cooperate with exam    Cranial Nerves:  II: PERRL.  Left hemianopsia III, IV, VI: Right gaze deviation, unable to cross midline V: Sensation is intact to light touch and diminished on the left VII: Face is symmetrical resting and smiling VIII: Hearing intact to voice. IX, X: Phonation is normal XII: Tongue protrudes midline Motor: Moves all 4 extremities, upper extremities with antigravity strength, appears to be moving lowers equally but not cooperative with exam  Tone: is normal and bulk is normal Sensation- Intact to light touch on the right but absent in the left arm and diminished in the left leg Gait- deferred   Most Recent NIH  1a Level of Conscious.: 0 1b LOC Questions: 0 1c LOC Commands: 0 2 Best Gaze: 2 3 Visual: 2 4 Facial Palsy: 0 5a Motor Arm - left: 2 5b Motor Arm - Right: 0 6a Motor Leg - Left: 0 6b Motor Leg - Right: 0 7 Limb Ataxia: 0 8 Sensory: 1 9 Best Language: 0 10 Dysarthria: 1 11 Extinct. and Inatten.: 0 TOTAL: 8   ASSESSMENT/PLAN Donna Petty is a 88 y.o. female with history of hypothyroidism, macular degeneration and depression admitted for left hemianopsia, right gaze deviation and left hemiparesis.  She was found to have a right PCA P1 occlusion, and mechanical thrombectomy was attempted.  Unfortunately, this procedure was unsuccessful due to extreme tortuosity of the vertebral artery. MRI reveals large acute right PCA infarct and a conditional scattered small bilateral acute cerebral and cerebellar infarcts. NIH on Admission 7   Stroke: Right PCA large infarct with multiple small scattered infarcts in bilateral hemispheres with R P1 occlusion s/p IR but unsuccessful, etiology: cardioembolic from new diagnosed atrial flutter   code Stroke CT head No acute abnormality. Small vessel disease. ASPECTS 10.    CTA head & neck right PCA P1 segment occlusion, right ICA origin plaque with 60% stenosis, bilateral cervical ICA tortuosity and possible FMD,  moderate left ICA siphon stenosis, mild right M2 branch stenoses S/p IR but unsuccessful due to extreme tortuous VA MRI large acute right PCA infarct, additional scattered small acute bilateral cerebral and cerebellar infarcts, moderate cerebral atrophy and chronic small vessel ischemic disease CT repeat R large PCA infarct stable, no hemorrahge 2D Echo EF 60-65%, LA normal  LDL 95 HgbA1c 5.4 VTE prophylaxis - SQ heparin No antithrombotic prior to admission, now on eliquis  Therapy recommendations: CIR Disposition: Pending   A- flutter New diagnosis Cardiology consulted Replace K  On Eliquis    Anxiety and agitation Vascular dementia Started Xanax 0.25 mg tabled BID PRN for anxiety  Start Seroquel 12.5 mg twice daily-> Qhs Off b/l hand mittens On home nemanda and Remeron  Pt behave better when family at the bedside   AKI Cre 1.40--1.05--1.06--1.25-1.38-1.30 Likely due to decreased p.o. intake Will encourage po intake Put on IV fluid  Hypertension Home meds: None Stable Keep systolic blood pressure less than 180   Hyperlipidemia Home meds: None LDL 95, goal < 70 Add atorvastatin 20 mg daily Continue statin at discharge   Dysphagia Patient has post-stroke dysphagia, SLP  On heart healthy diet and thin liquid Encourage po intake   Other Stroke Risk Factors Obesity, Body mass index is 31.72 kg/m., BMI >/= 30 associated with increased stroke risk, recommend weight loss, diet and exercise as appropriate    Other Active Problems   Hospital day # 7   Donna Geralds DNP, ACNPC-AG  [facility]  ATTENDING NOTE: I reviewed above note and agree with the assessment and plan. Pt was seen and examined.   No family at the bedside. Pt lying in bed, lethargic, briefly open eyes on voice, when ask what else she needs, she said she wanted good food, the food in hospital was bad you do not have cook here. Not quite cooperative with exam but moving all extremities.  PMR re-consulted, now pending CIR placement. Continue eliquis and statin.   For detailed assessment and plan, please refer to above as I have made changes wherever appropriate.   Donna Cummins, MD PhD Stroke Neurology 05/31/2024 3:05 PM    To contact Stroke Continuity provider, please refer to [email or web address]. After hours, contact General Neurology

## 2024-05-31 NOTE — Plan of Care (Signed)
  Problem: Education: Goal: Knowledge of disease or condition will improve 05/31/2024 0603 by Gaetana Randall Mathew GORMAN, RN Outcome: Progressing 05/31/2024 0503 by Gaetana Randall Mathew GORMAN, RN Outcome: Progressing Goal: Knowledge of secondary prevention will improve (MUST DOCUMENT ALL) 05/31/2024 0603 by Gaetana Randall Mathew GORMAN, RN Outcome: Progressing 05/31/2024 0503 by Gaetana Randall Mathew GORMAN, RN Outcome: Progressing Goal: Knowledge of patient specific risk factors will improve (DELETE if not current risk factor) 05/31/2024 0603 by Gaetana Randall Mathew GORMAN, RN Outcome: Progressing 05/31/2024 0503 by Gaetana Randall Mathew GORMAN, RN Outcome: Progressing   Problem: Ischemic Stroke/TIA Tissue Perfusion: Goal: Complications of ischemic stroke/TIA will be minimized 05/31/2024 0603 by Gaetana Randall Mathew GORMAN, RN Outcome: Progressing 05/31/2024 0503 by Gaetana Randall Mathew GORMAN, RN Outcome: Progressing

## 2024-06-01 DIAGNOSIS — R29704 NIHSS score 4: Secondary | ICD-10-CM

## 2024-06-01 LAB — CBC
HCT: 37.7 % (ref 36.0–46.0)
Hemoglobin: 12.2 g/dL (ref 12.0–15.0)
MCH: 29.8 pg (ref 26.0–34.0)
MCHC: 32.4 g/dL (ref 30.0–36.0)
MCV: 92.2 fL (ref 80.0–100.0)
Platelets: 288 K/uL (ref 150–400)
RBC: 4.09 MIL/uL (ref 3.87–5.11)
RDW: 15.3 % (ref 11.5–15.5)
WBC: 6.8 K/uL (ref 4.0–10.5)
nRBC: 0 % (ref 0.0–0.2)

## 2024-06-01 LAB — BASIC METABOLIC PANEL WITH GFR
Anion gap: 10 (ref 5–15)
BUN: 17 mg/dL (ref 8–23)
CO2: 21 mmol/L — ABNORMAL LOW (ref 22–32)
Calcium: 8.6 mg/dL — ABNORMAL LOW (ref 8.9–10.3)
Chloride: 109 mmol/L (ref 98–111)
Creatinine, Ser: 1.26 mg/dL — ABNORMAL HIGH (ref 0.44–1.00)
GFR, Estimated: 42 mL/min — ABNORMAL LOW (ref >60)
Glucose, Bld: 93 mg/dL (ref 70–99)
Potassium: 4.4 mmol/L (ref 3.5–5.1)
Sodium: 140 mmol/L (ref 135–145)

## 2024-06-01 NOTE — Progress Notes (Signed)
 Inpatient Rehab Admissions Coordinator:    CIR following. Await decision from insurance.  Leita Kleine, MS, CCC-SLP Rehab Admissions Coordinator  [phone] (celll) [phone] (office)

## 2024-06-01 NOTE — Plan of Care (Signed)
 Refused to wear SCD, said she don't like being squeezed her leg,refuse to take BP, said: she don't want to squeeze her hand. Frequent counseling done and highlighted the importance of wearing SCD and BP measurements, additionally to make her mood on brought her favorite ginger ale drinks, played her Favorite songs on youtube, frequently education was given about the importance of wearing SCD  and BP measurements but refused it strictly. Daughter came today to visit her, she looks happy at that time after she went again she remain sad and angry.  Problem: Education: Goal: Understanding of CV disease, CV risk reduction, and recovery process will improve Outcome: Progressing   Problem: Skin Integrity: Goal: Risk for impaired skin integrity will decrease Outcome: Progressing   Problem: Safety: Goal: Ability to remain free from injury will improve Outcome: Progressing   Problem: Pain Managment: Goal: General experience of comfort will improve and/or be controlled Outcome: Progressing   Problem: Elimination: Goal: Will not experience complications related to urinary retention Outcome: Progressing   Problem: Elimination: Goal: Will not experience complications related to bowel motility Outcome: Progressing   Problem: Coping: Goal: Level of anxiety will decrease Outcome: Progressing   Problem: Activity: Goal: Risk for activity intolerance will decrease Outcome: Progressing   Problem: Clinical Measurements: Goal: Will remain free from infection Outcome: Progressing   Problem: Education: Goal: Knowledge of General Education information will improve Description: Including pain rating scale, medication(s)/side effects and non-pharmacologic comfort measures Outcome: Progressing   Problem: Nutrition: Goal: Risk of aspiration will decrease Outcome: Progressing   Problem: Nutrition: Goal: Dietary intake will improve Outcome: Progressing   Problem: Education: Goal: Knowledge of  patient specific risk factors will improve (DELETE if not current risk factor) Outcome: Progressing   Problem: Education: Goal: Knowledge of secondary prevention will improve (MUST DOCUMENT ALL) Outcome: Progressing   Problem: Education: Goal: Knowledge of disease or condition will improve Outcome: Progressing

## 2024-06-01 NOTE — Progress Notes (Addendum)
 STROKE TEAM PROGRESS NOTE    SIGNIFICANT HOSPITAL EVENTS 10/21: Patient admitted with left-sided weakness and right gaze deviation, mechanical thrombectomy attempted for right PCA P1 occlusion but unsuccessful due to extreme vertebral artery tortuosity 10/22- Transfer out of ICU 10/23: - Atrial flutter on monitor, cardiology consulted. - Okay with anticoagulation per cardiology.  Not planning rhythm control.  INTERIM HISTORY/SUBJECTIVE  No acute events overnight.  Labs, vitals, neurological exam stable. Medically stable for discharge to CIR.  Pending insurance approval.  CBC    Component Value Date/Time   WBC 6.8 06/01/2024 0133   RBC 4.09 06/01/2024 0133   HGB 12.2 06/01/2024 0133   HCT 37.7 06/01/2024 0133   PLT 288 06/01/2024 0133   MCV 92.2 06/01/2024 0133   MCH 29.8 06/01/2024 0133   MCHC 32.4 06/01/2024 0133   RDW 15.3 06/01/2024 0133   LYMPHSABS 2.2 05/24/2024 1056   MONOABS 0.7 05/24/2024 1056   EOSABS 0.2 05/24/2024 1056   BASOSABS 0.0 05/24/2024 1056    BMET    Component Value Date/Time   NA 140 06/01/2024 0133   K 4.4 06/01/2024 0133   CL 109 06/01/2024 0133   CO2 21 (L) 06/01/2024 0133   GLUCOSE 93 06/01/2024 0133   BUN 17 06/01/2024 0133   CREATININE 1.26 (H) 06/01/2024 0133   CALCIUM  8.6 (L) 06/01/2024 0133   GFRNONAA 42 (L) 06/01/2024 0133    IMAGING past 24 hours No results found.   Vitals:   05/31/24 1951 06/01/24 0003 06/01/24 0409 06/01/24 0822  BP: (!) 148/78 (!) 146/62 (!) 151/66 (!) 149/113  Pulse: 73 71 70 69  Resp: 17 17 17 18   Temp:  97.9 F (36.6 C) 98.5 F (36.9 C) 98.9 F (37.2 C)  TempSrc: Oral Oral Oral Oral  SpO2: 96% 92% 92% 93%  Weight:         PHYSICAL EXAM General: Ill-appearing elderly patient in no acute distress Psych:  flat CV: Regular rate and rhythm on monitor Respiratory:  Regular, unlabored respirations on room air     NEURO:  Mental Status: Drowsy. No dysarthria or aphasia. Minimal words. Not very  cooperative with exam.    Cranial Nerves:  II: PERRL.  Left hemianopsia III, IV, VI: Right gaze deviation, unable to cross midline V: Sensation is intact to light touch and diminished on the left VII: Face is symmetrical resting and smiling VIII: Hearing intact to voice. IX, X: Phonation is normal XII: Tongue protrudes midline Motor: Moves all 4 extremities, upper extremities with antigravity strength, appears to be moving lowers equally but not cooperative with exam  Tone: is normal and bulk is normal Sensation- Intact to light touch on the right but absent in the left arm and diminished in the left leg Gait- deferred   Most Recent NIH  1a Level of Conscious.: 0 1b LOC Questions: 0 1c LOC Commands: 0 2 Best Gaze: 0 3 Visual: 0 4 Facial Palsy: 0 5a Motor Arm - left: 1 5b Motor Arm - Right: 0 6a Motor Leg - Left: 1 6b Motor Leg - Right: 1 7 Limb Ataxia: 0 8 Sensory: 1 9 Best Language: 0 10 Dysarthria: 0 11 Extinct. and Inatten.: 0 TOTAL: 4   ASSESSMENT/PLAN [name] is a 88 y.o. female with history of hypothyroidism, macular degeneration and depression admitted for left hemianopsia, right gaze deviation and left hemiparesis.  She was found to have a right PCA P1 occlusion, and mechanical thrombectomy was attempted.  Unfortunately, this procedure was  unsuccessful due to extreme tortuosity of the vertebral artery. MRI reveals large acute right PCA infarct and a conditional scattered small bilateral acute cerebral and cerebellar infarcts. NIH on Admission 7   Stroke: Right PCA large infarct with multiple small scattered infarcts in bilateral hemispheres with R P1 occlusion s/p IR but unsuccessful, etiology: cardioembolic from new diagnosed atrial flutter   code Stroke CT head No acute abnormality. Small vessel disease. ASPECTS 10.    CTA head & neck right PCA P1 segment occlusion, right ICA origin plaque with 60% stenosis, bilateral cervical ICA tortuosity and possible FMD,  moderate left ICA siphon stenosis, mild right M2 branch stenoses S/p IR but unsuccessful due to extreme tortuous [facility] MRI large acute right PCA infarct, additional scattered small acute bilateral cerebral and cerebellar infarcts, moderate cerebral atrophy and chronic small vessel ischemic disease CT repeat R large PCA infarct stable, no hemorrahge 2D Echo EF 60-65%, LA normal  LDL 95 HgbA1c 5.4 VTE prophylaxis - SQ heparin No antithrombotic prior to admission, now on eliquis  Therapy recommendations: CIR Disposition: Pending insurance approval   A- flutter New diagnosis Cardiology consulted Replace K  Continue Eliquis    Anxiety and agitation Vascular dementia Started Xanax 0.25 mg tabled BID PRN for anxiety  Start Seroquel 12.5 mg twice daily-> Qhs Off b/l hand mittens On home nemanda and Remeron  Pt noted to behave better when family at the bedside   AKI Cre 1.40--1.05--1.06--1.25-1.38-1.30-1.26 Likely due to decreased p.o. intake Continue to encourage po intake Continue IV fluid  Hypertension Home meds: None Stable Keep systolic blood pressure less than 180   Hyperlipidemia Home meds: None LDL 95, goal < 70 Continue atorvastatin 20 mg daily Continue statin at discharge   Dysphagia Patient has post-stroke dysphagia, SLP  On heart healthy diet and thin liquid Encourage po intake   Other Stroke Risk Factors Obesity, Body mass index is 31.72 kg/m., BMI >/= 30 associated with increased stroke risk, recommend weight loss, diet and exercise as appropriate     Hospital day # 8   Pt seen by Neuro NP/APP and later by MD. Note/plan to be edited by MD as needed.    Rocky JAYSON Likes, DNP [facility] Please use AMION for contact information & EPIC for messaging.  ATTENDING NOTE: I reviewed above note and agree with the assessment and plan. Pt was seen and examined.   No family at bedside.  Patient lying in bed, open eyes on voice, able to maintain eye  opening during round.  Neuro unchanged.  Creatinine improving, continue to encourage p.o. intake.  On Eliquis and statin.  Pending CIR.   For detailed assessment and plan, please refer to above as I have made changes wherever appropriate.   Ary Cummins, MD PhD Stroke Neurology 06/01/2024 5:43 PM

## 2024-06-01 NOTE — Progress Notes (Signed)
 Speech Language Pathology Treatment: Cognitive-Linguistic  Patient Details Name: Donna Petty MRN: [id number] DOB: [birth year] Today's Date: 06/01/2024 Time: 1100-1120 SLP Time Calculation (min) (ACUTE ONLY): 20 min  Assessment / Plan / Recommendation Clinical Impression  Patient was alert and awake. Patient experienced some irritability throughout session. Patient was disoriented to her situation and time. She stated that she was in the hospital because her daughter made her, and that there was nothing wrong with her. Patient declined all POs. Patient demonstrated improvement in her left field attention as she identified her dentures on the left side and placed them in correctly. Patient had unopened card in room that SLP worked with to open with her. Patient required cues to attend to the left return address on envelope (Patient able to read font without her glasses). Patient required cues to attend to the top half of card. Patient attempted to comb her hair but grew frustrated with activity due to tangled hair. Patient would become irritable at times with questions. She gave very basic and blunt responses and did not elaborate. SLP will continue to follow.   HPI HPI: Patient is an 88 y.o. female with PMH: dementia, macular degeneration, hypothyroidism, CAD, depression. She lives with her daughter at baseline. She presented to the hospital on 05/24/24 after sudden onset left sided symptoms, leaning to the left and had right gaze deviation. In ED, she was hypertensive with left hemianopsia with left sided extinction. CT negative for large territory infarct or hemorrhage. MRI brain showed large left right PCA infarct, additional scattered small acute bilateral cerebral and cerebellar infarcts, moderate cerebral atrophy. TNK administered but unable to do thrombectomy.      SLP Plan  Continue with current plan of care          Recommendations                     Oral care  BID;Staff/trained caregiver to provide oral care     Cognitive communication deficit (M58.158)     Continue with current plan of care     Damien Hy  Graduate SLP Clinican

## 2024-06-01 NOTE — Plan of Care (Signed)
  Problem: Nutrition: Goal: Risk of aspiration will decrease Outcome: Progressing Goal: Dietary intake will improve Outcome: Progressing   Problem: Clinical Measurements: Goal: Ability to maintain clinical measurements within normal limits will improve Outcome: Progressing Goal: Will remain free from infection Outcome: Progressing Goal: Diagnostic test results will improve Outcome: Progressing Goal: Respiratory complications will improve Outcome: Progressing Goal: Cardiovascular complication will be avoided Outcome: Progressing   Problem: Activity: Goal: Risk for activity intolerance will decrease Outcome: Progressing   Problem: Elimination: Goal: Will not experience complications related to bowel motility Outcome: Progressing Goal: Will not experience complications related to urinary retention Outcome: Progressing   Problem: Pain Managment: Goal: General experience of comfort will improve and/or be controlled Outcome: Progressing   Problem: Safety: Goal: Ability to remain free from injury will improve Outcome: Progressing   Problem: Skin Integrity: Goal: Risk for impaired skin integrity will decrease Outcome: Progressing

## 2024-06-01 NOTE — Progress Notes (Incomplete)
 STROKE TEAM PROGRESS NOTE    SIGNIFICANT HOSPITAL EVENTS 10/21: Patient admitted with left-sided weakness and right gaze deviation, mechanical thrombectomy attempted for right PCA P1 occlusion but unsuccessful due to extreme vertebral artery tortuosity 10/22- Transfer out of ICU 10/23: - Atrial flutter on monitor, cardiology consulted. - Okay with anticoagulation per cardiology.  Not planning rhythm control.  INTERIM HISTORY/SUBJECTIVE   CBC    Component Value Date/Time   WBC 6.8 06/01/2024 0133   RBC 4.09 06/01/2024 0133   HGB 12.2 06/01/2024 0133   HCT 37.7 06/01/2024 0133   PLT 288 06/01/2024 0133   MCV 92.2 06/01/2024 0133   MCH 29.8 06/01/2024 0133   MCHC 32.4 06/01/2024 0133   RDW 15.3 06/01/2024 0133   LYMPHSABS 2.2 05/24/2024 1056   MONOABS 0.7 05/24/2024 1056   EOSABS 0.2 05/24/2024 1056   BASOSABS 0.0 05/24/2024 1056    BMET    Component Value Date/Time   NA 140 06/01/2024 0133   K 4.4 06/01/2024 0133   CL 109 06/01/2024 0133   CO2 21 (L) 06/01/2024 0133   GLUCOSE 93 06/01/2024 0133   BUN 17 06/01/2024 0133   CREATININE 1.26 (H) 06/01/2024 0133   CALCIUM  8.6 (L) 06/01/2024 0133   GFRNONAA 42 (L) 06/01/2024 0133    IMAGING past 24 hours No results found.   Vitals:   05/31/24 1638 05/31/24 1951 06/01/24 0003 06/01/24 0409  BP: 138/66 (!) 148/78 (!) 146/62 (!) 151/66  Pulse: 69 73 71 70  Resp: 16 17 17 17   Temp: 98.4 F (36.9 C) 99.4 F (37.4 C) 97.9 F (36.6 C) 98.5 F (36.9 C)  TempSrc: Oral Oral Oral Oral  SpO2: 95% 96% 92% 92%  Weight:         PHYSICAL EXAM General: Ill-appearing elderly patient in no acute distress Psych:  flat CV: Regular rate and rhythm on monitor Respiratory:  Regular, unlabored respirations on room air     NEURO:  Mental Status: awake and alert. No dysarthria or aphasia. Minimal words. Will not cooperate with exam    Cranial Nerves:  II: PERRL.  Left hemianopsia III, IV, VI: Right gaze deviation, unable to  cross midline V: Sensation is intact to light touch and diminished on the left VII: Face is symmetrical resting and smiling VIII: Hearing intact to voice. IX, X: Phonation is normal XII: Tongue protrudes midline Motor: Moves all 4 extremities, upper extremities with antigravity strength, appears to be moving lowers equally but not cooperative with exam  Tone: is normal and bulk is normal Sensation- Intact to light touch on the right but absent in the left arm and diminished in the left leg Gait- deferred   Most Recent NIH  1a Level of Conscious.: 0 1b LOC Questions: 0 1c LOC Commands: 0 2 Best Gaze: 2 3 Visual: 2 4 Facial Palsy: 0 5a Motor Arm - left: 2 5b Motor Arm - Right: 0 6a Motor Leg - Left: 0 6b Motor Leg - Right: 0 7 Limb Ataxia: 0 8 Sensory: 1 9 Best Language: 0 10 Dysarthria: 1 11 Extinct. and Inatten.: 0 TOTAL: 8   ASSESSMENT/PLAN Donna Petty is a 88 y.o. female with history of hypothyroidism, macular degeneration and depression admitted for left hemianopsia, right gaze deviation and left hemiparesis.  She was found to have a right PCA P1 occlusion, and mechanical thrombectomy was attempted.  Unfortunately, this procedure was unsuccessful due to extreme tortuosity of the vertebral artery. MRI reveals large acute right PCA infarct  and a conditional scattered small bilateral acute cerebral and cerebellar infarcts. NIH on Admission 7   Stroke: Right PCA large infarct with multiple small scattered infarcts in bilateral hemispheres with R P1 occlusion s/p IR but unsuccessful, etiology: cardioembolic from new diagnosed atrial flutter   code Stroke CT head No acute abnormality. Small vessel disease. ASPECTS 10.    CTA head & neck right PCA P1 segment occlusion, right ICA origin plaque with 60% stenosis, bilateral cervical ICA tortuosity and possible FMD, moderate left ICA siphon stenosis, mild right M2 branch stenoses S/p IR but unsuccessful due to extreme tortuous  VA MRI large acute right PCA infarct, additional scattered small acute bilateral cerebral and cerebellar infarcts, moderate cerebral atrophy and chronic small vessel ischemic disease CT repeat R large PCA infarct stable, no hemorrahge 2D Echo EF 60-65%, LA normal  LDL 95 HgbA1c 5.4 VTE prophylaxis - SQ heparin No antithrombotic prior to admission, now on eliquis  Therapy recommendations: CIR Disposition: Pending   A- flutter New diagnosis Cardiology consulted Replace K  On Eliquis    Anxiety and agitation Vascular dementia Started Xanax 0.25 mg tabled BID PRN for anxiety  Start Seroquel 12.5 mg twice daily-> Qhs Off b/l hand mittens On home nemanda and Remeron  Pt behave better when family at the bedside   AKI Cre 1.40--1.05--1.06--1.25-1.38-1.30 Likely due to decreased p.o. intake Will encourage po intake Put on IV fluid  Hypertension Home meds: None Stable Keep systolic blood pressure less than 180   Hyperlipidemia Home meds: None LDL 95, goal < 70 Add atorvastatin 20 mg daily Continue statin at discharge   Dysphagia Patient has post-stroke dysphagia, SLP  On heart healthy diet and thin liquid Encourage po intake   Other Stroke Risk Factors Obesity, Body mass index is 31.72 kg/m., BMI >/= 30 associated with increased stroke risk, recommend weight loss, diet and exercise as appropriate    Other Active Problems   Hospital day # 8

## 2024-06-02 ENCOUNTER — Other Ambulatory Visit: Payer: Self-pay

## 2024-06-02 ENCOUNTER — Encounter (HOSPITAL_COMMUNITY): Payer: Self-pay | Admitting: Physical Medicine & Rehabilitation

## 2024-06-02 ENCOUNTER — Inpatient Hospital Stay (HOSPITAL_COMMUNITY)
Admission: AD | Admit: 2024-06-02 | Discharge: 2024-06-15 | DRG: 056 | Disposition: A | Discharge status: Home / Self Care | Source: Intra-hospital | Attending: Physical Medicine & Rehabilitation | Admitting: Physical Medicine & Rehabilitation

## 2024-06-02 DIAGNOSIS — N1831 Chronic kidney disease, stage 3a: Secondary | ICD-10-CM | POA: Diagnosis not present

## 2024-06-02 DIAGNOSIS — I639 Cerebral infarction, unspecified: Secondary | ICD-10-CM | POA: Diagnosis not present

## 2024-06-02 DIAGNOSIS — E039 Hypothyroidism, unspecified: Secondary | ICD-10-CM | POA: Diagnosis present

## 2024-06-02 DIAGNOSIS — R29702 NIHSS score 2: Secondary | ICD-10-CM

## 2024-06-02 DIAGNOSIS — E785 Hyperlipidemia, unspecified: Secondary | ICD-10-CM | POA: Diagnosis not present

## 2024-06-02 DIAGNOSIS — E1122 Type 2 diabetes mellitus with diabetic chronic kidney disease: Secondary | ICD-10-CM | POA: Diagnosis not present

## 2024-06-02 DIAGNOSIS — I4892 Unspecified atrial flutter: Secondary | ICD-10-CM | POA: Insufficient documentation

## 2024-06-02 DIAGNOSIS — Z515 Encounter for palliative care: Secondary | ICD-10-CM | POA: Diagnosis not present

## 2024-06-02 DIAGNOSIS — F0153 Vascular dementia, unspecified severity, with mood disturbance: Secondary | ICD-10-CM | POA: Diagnosis not present

## 2024-06-02 DIAGNOSIS — Z8249 Family history of ischemic heart disease and other diseases of the circulatory system: Secondary | ICD-10-CM

## 2024-06-02 DIAGNOSIS — Z8619 Personal history of other infectious and parasitic diseases: Secondary | ICD-10-CM

## 2024-06-02 DIAGNOSIS — F411 Generalized anxiety disorder: Secondary | ICD-10-CM | POA: Insufficient documentation

## 2024-06-02 DIAGNOSIS — K219 Gastro-esophageal reflux disease without esophagitis: Secondary | ICD-10-CM | POA: Diagnosis not present

## 2024-06-02 DIAGNOSIS — F0154 Vascular dementia, unspecified severity, with anxiety: Secondary | ICD-10-CM | POA: Diagnosis not present

## 2024-06-02 DIAGNOSIS — F329 Major depressive disorder, single episode, unspecified: Secondary | ICD-10-CM | POA: Diagnosis present

## 2024-06-02 DIAGNOSIS — I129 Hypertensive chronic kidney disease with stage 1 through stage 4 chronic kidney disease, or unspecified chronic kidney disease: Secondary | ICD-10-CM | POA: Diagnosis present

## 2024-06-02 DIAGNOSIS — F01518 Vascular dementia, unspecified severity, with other behavioral disturbance: Secondary | ICD-10-CM | POA: Diagnosis present

## 2024-06-02 DIAGNOSIS — H5347 Heteronymous bilateral field defects: Secondary | ICD-10-CM | POA: Diagnosis present

## 2024-06-02 DIAGNOSIS — Z7901 Long term (current) use of anticoagulants: Secondary | ICD-10-CM | POA: Diagnosis not present

## 2024-06-02 DIAGNOSIS — R45851 Suicidal ideations: Secondary | ICD-10-CM | POA: Diagnosis present

## 2024-06-02 DIAGNOSIS — I63531 Cerebral infarction due to unspecified occlusion or stenosis of right posterior cerebral artery: Secondary | ICD-10-CM | POA: Diagnosis not present

## 2024-06-02 DIAGNOSIS — H3530 Unspecified macular degeneration: Secondary | ICD-10-CM | POA: Diagnosis present

## 2024-06-02 DIAGNOSIS — Z79899 Other long term (current) drug therapy: Secondary | ICD-10-CM | POA: Diagnosis not present

## 2024-06-02 DIAGNOSIS — Z79890 Hormone replacement therapy (postmenopausal): Secondary | ICD-10-CM

## 2024-06-02 DIAGNOSIS — G4700 Insomnia, unspecified: Secondary | ICD-10-CM | POA: Diagnosis present

## 2024-06-02 DIAGNOSIS — Z9841 Cataract extraction status, right eye: Secondary | ICD-10-CM

## 2024-06-02 DIAGNOSIS — G936 Cerebral edema: Secondary | ICD-10-CM | POA: Diagnosis present

## 2024-06-02 DIAGNOSIS — I69354 Hemiplegia and hemiparesis following cerebral infarction affecting left non-dominant side: Principal | ICD-10-CM

## 2024-06-02 DIAGNOSIS — Z96652 Presence of left artificial knee joint: Secondary | ICD-10-CM | POA: Diagnosis not present

## 2024-06-02 DIAGNOSIS — Z96642 Presence of left artificial hip joint: Secondary | ICD-10-CM | POA: Diagnosis not present

## 2024-06-02 DIAGNOSIS — F4321 Adjustment disorder with depressed mood: Secondary | ICD-10-CM | POA: Diagnosis not present

## 2024-06-02 DIAGNOSIS — F32A Depression, unspecified: Secondary | ICD-10-CM | POA: Diagnosis not present

## 2024-06-02 DIAGNOSIS — I10 Essential (primary) hypertension: Secondary | ICD-10-CM | POA: Diagnosis not present

## 2024-06-02 DIAGNOSIS — R4189 Other symptoms and signs involving cognitive functions and awareness: Secondary | ICD-10-CM | POA: Diagnosis present

## 2024-06-02 DIAGNOSIS — Z66 Do not resuscitate: Secondary | ICD-10-CM | POA: Diagnosis not present

## 2024-06-02 DIAGNOSIS — Z9842 Cataract extraction status, left eye: Secondary | ICD-10-CM

## 2024-06-02 DIAGNOSIS — M1990 Unspecified osteoarthritis, unspecified site: Secondary | ICD-10-CM | POA: Diagnosis present

## 2024-06-02 DIAGNOSIS — Z7189 Other specified counseling: Secondary | ICD-10-CM | POA: Diagnosis not present

## 2024-06-02 MED ORDER — MIRTAZAPINE 15 MG PO TABS
15.0000 mg | ORAL_TABLET | Freq: Every day | ORAL | Status: DC
  Administered 2024-06-02 – 2024-06-14 (×13): 15 mg via ORAL
  Filled 2024-06-02 (×14): qty 1

## 2024-06-02 MED ORDER — SENNOSIDES-DOCUSATE SODIUM 8.6-50 MG PO TABS: 1.0000 | ORAL_TABLET | Freq: Every evening | ORAL | Status: DC | PRN

## 2024-06-02 MED ORDER — APIXABAN 5 MG PO TABS
5.0000 mg | ORAL_TABLET | Freq: Two times a day (BID) | ORAL | Status: DC
  Administered 2024-06-02 – 2024-06-15 (×26): 5 mg via ORAL
  Filled 2024-06-02 (×27): qty 1

## 2024-06-02 MED ORDER — ENSURE PLUS HIGH PROTEIN PO LIQD
237.0000 mL | Freq: Two times a day (BID) | ORAL | Status: DC
  Administered 2024-06-02: 237 mL via ORAL

## 2024-06-02 MED ORDER — POLYETHYLENE GLYCOL 3350 17 G PO PACK
17.0000 g | PACK | Freq: Two times a day (BID) | ORAL | Status: DC
  Administered 2024-06-02 – 2024-06-11 (×7): 17 g via ORAL
  Filled 2024-06-02 (×25): qty 1

## 2024-06-02 MED ORDER — SENNOSIDES-DOCUSATE SODIUM 8.6-50 MG PO TABS
1.0000 | ORAL_TABLET | Freq: Every evening | ORAL | Status: DC | PRN
  Administered 2024-06-12: 1 via ORAL
  Filled 2024-06-02: qty 1

## 2024-06-02 MED ORDER — METHOCARBAMOL 500 MG PO TABS: 500.0000 mg | ORAL_TABLET | Freq: Three times a day (TID) | ORAL | Status: DC | PRN

## 2024-06-02 MED ORDER — PANTOPRAZOLE SODIUM 40 MG PO TBEC
40.0000 mg | DELAYED_RELEASE_TABLET | Freq: Every day | ORAL | Status: DC
  Administered 2024-06-02 – 2024-06-07 (×6): 40 mg via ORAL
  Filled 2024-06-02 (×7): qty 1

## 2024-06-02 MED ORDER — INFLUENZA VAC SPLIT HIGH-DOSE 0.5 ML IM SUSY: 0.5000 mL | PREFILLED_SYRINGE | INTRAMUSCULAR | Status: DC

## 2024-06-02 MED ORDER — ACETAMINOPHEN 650 MG RE SUPP: 650.0000 mg | RECTAL | Status: DC | PRN

## 2024-06-02 MED ORDER — APIXABAN 5 MG PO TABS: 5.0000 mg | ORAL_TABLET | Freq: Two times a day (BID) | ORAL | Status: DC

## 2024-06-02 MED ORDER — METHOCARBAMOL 500 MG PO TABS
500.0000 mg | ORAL_TABLET | Freq: Three times a day (TID) | ORAL | Status: DC | PRN
  Administered 2024-06-02: 500 mg via ORAL
  Filled 2024-06-02 (×2): qty 1

## 2024-06-02 MED ORDER — ALPRAZOLAM 0.25 MG PO TABS
0.2500 mg | ORAL_TABLET | Freq: Two times a day (BID) | ORAL | Status: DC | PRN
  Administered 2024-06-02: 0.25 mg via ORAL
  Filled 2024-06-02: qty 1

## 2024-06-02 MED ORDER — ENSURE PLUS HIGH PROTEIN PO LIQD: 237.0000 mL | Freq: Two times a day (BID) | ORAL | Status: DC

## 2024-06-02 MED ORDER — AMLODIPINE BESYLATE 5 MG PO TABS
5.0000 mg | ORAL_TABLET | Freq: Every day | ORAL | Status: DC
  Administered 2024-06-03 – 2024-06-04 (×2): 5 mg via ORAL
  Filled 2024-06-02 (×2): qty 1

## 2024-06-02 MED ORDER — ENSURE PLUS HIGH PROTEIN PO LIQD
237.0000 mL | Freq: Two times a day (BID) | ORAL | Status: DC
  Administered 2024-06-03 – 2024-06-13 (×12): 237 mL via ORAL

## 2024-06-02 MED ORDER — ATORVASTATIN CALCIUM 20 MG PO TABS: 20.0000 mg | ORAL_TABLET | Freq: Every day | ORAL | Status: DC

## 2024-06-02 MED ORDER — AMLODIPINE BESYLATE 5 MG PO TABS: 5.0000 mg | ORAL_TABLET | Freq: Every day | ORAL | Status: DC

## 2024-06-02 MED ORDER — LEVOTHYROXINE SODIUM 100 MCG PO TABS
100.0000 ug | ORAL_TABLET | Freq: Every day | ORAL | Status: DC
  Administered 2024-06-04 – 2024-06-15 (×12): 100 ug via ORAL
  Filled 2024-06-02 (×13): qty 1

## 2024-06-02 MED ORDER — POLYETHYLENE GLYCOL 3350 17 G PO PACK: 17.0000 g | PACK | Freq: Two times a day (BID) | ORAL | Status: DC

## 2024-06-02 MED ORDER — MEMANTINE HCL 10 MG PO TABS
5.0000 mg | ORAL_TABLET | Freq: Two times a day (BID) | ORAL | Status: DC
  Administered 2024-06-02 – 2024-06-15 (×26): 5 mg via ORAL
  Filled 2024-06-02 (×27): qty 1

## 2024-06-02 MED ORDER — ACETAMINOPHEN 325 MG PO TABS
650.0000 mg | ORAL_TABLET | ORAL | Status: DC | PRN
  Administered 2024-06-02: 650 mg via ORAL
  Filled 2024-06-02: qty 2

## 2024-06-02 MED ORDER — ACETAMINOPHEN 160 MG/5ML PO SOLN: 650.0000 mg | ORAL | Status: DC | PRN

## 2024-06-02 MED ORDER — QUETIAPINE FUMARATE 25 MG PO TABS
12.5000 mg | ORAL_TABLET | Freq: Every day | ORAL | Status: DC
  Administered 2024-06-02 – 2024-06-03 (×2): 12.5 mg via ORAL
  Filled 2024-06-02 (×2): qty 1

## 2024-06-02 MED ORDER — ATORVASTATIN CALCIUM 10 MG PO TABS
20.0000 mg | ORAL_TABLET | Freq: Every day | ORAL | Status: DC
  Administered 2024-06-03 – 2024-06-08 (×6): 20 mg via ORAL
  Filled 2024-06-02 (×6): qty 2

## 2024-06-02 NOTE — Progress Notes (Signed)
 Speech Language Pathology Treatment: Dysphagia;Cognitive-Linguistic  Patient Details Name: Donna Petty MRN: [id number] DOB: [birth year] Today's Date: 06/02/2024 Time: 8857-8843 SLP Time Calculation (min) (ACUTE ONLY): 14 min  Assessment / Plan / Recommendation Clinical Impression  Pt seen for swallow and cognitive therapy with pt upright in chair. Pt's diet texture upgraded yesterday she was agreeable to graham crackers, thin liquids with timely mastication, complete clearance and no indications of decreased airway protection. Recommend continue regular, thin, pills with thin. Pt can self feed but may need assist with set up and location of all items on plate due to left neglect. No further tx for swallow is indicated. She continues to demonstrate decreased intellectual awareness of diagnosis/deficits related to cognition, unable to recall reason for admission and stated there is nothing wrong with my memory. She reported need to work on walking and with cueing acknowledged plan for rehab. Pt will look at therapist on her left when cued but gaze usually to right or midline. She read a short 3 sentence paragraph missing words on left for 2nd sentence however with tactile/verbal cues and using finger to scan was able to visualize. Pt was asked to point to correct picture from information read and needed moderate assist to re-read for accuracy. At that point (end of session) she became frustrated and yelled out Stop it. I'm tired of all this racket and questions. She is being discharged to inpatient rehab today to continue cognitive therapy.    HPI HPI: Patient is an 88 y.o. female with PMH: dementia, macular degeneration, hypothyroidism, CAD, depression. She lives with her daughter at baseline. She presented to the hospital on 05/24/24 after sudden onset left sided symptoms, leaning to the left and had right gaze deviation. In ED, she was hypertensive with left hemianopsia with left sided extinction.  CT negative for large territory infarct or hemorrhage. MRI brain showed large left right PCA infarct, additional scattered small acute bilateral cerebral and cerebellar infarcts, moderate cerebral atrophy. TNK administered but unable to do thrombectomy.      SLP Plan  Continue with current plan of care          Recommendations  Diet recommendations: Regular;Thin liquid Liquids provided via: Straw;Cup Medication Administration: Whole meds with liquid Supervision: Patient able to self feed;Staff to assist with self feeding (assist due to left neglect) Compensations: Slow rate;Small sips/bites Postural Changes and/or Swallow Maneuvers: Seated upright 90 degrees;Upright 30-60 min after meal                Rehab consult Oral care BID   Frequent or constant Supervision/Assistance Dysphagia, unspecified (R13.10);Cognitive communication deficit (M58.158)     Continue with current plan of care     Dustin Olam Bull  06/02/2024, 1:55 PM

## 2024-06-02 NOTE — Progress Notes (Signed)
 Inpatient Rehab Admissions Coordinator:    I have a CIR bed for this Pt. Today. RN to call report to [phone].  Pt. To admit to CIR for 14-16 days with the goal of dc home with her daughter at supervision to min A.  Leita Kleine, MS, CCC-SLP Rehab Admissions Coordinator  [phone] (celll) [phone] (office)

## 2024-06-02 NOTE — H&P (Signed)
 Physical Medicine and Rehabilitation Admission H&P    Chief Complaint  Patient presents with   Code Stroke  : HPI: Donna Petty is a 88 year old right-handed female with history of depression, hypothyroidism, left ICA stenosis 50-70%, hyperlipidemia, memory deficits /vascular dementia maintained on Namenda , GERD, macular degeneration.  Per chart review patient lives with her children.  1 level home 6 steps to entry.  Independent prior to admission however she will use a rollator at times within the community.  Presented 05/24/2024 with acute onset of left-sided weakness right side gaze and left hemianopsia.  Blood pressure elevated 190s systolic in the ED.  Cranial CT scan showed stable chronic small vessel disease.  CTA showed right PCA P1 segment occlusion bilateral proximal subclavian artery and vertebral artery origin atherosclerosis without hemodynamically significant stenosis.  Right ICA origin plaque with 60% stenosis.  Bilateral cervical ICA tortuosity and possible fibromuscular dysplasia.  LICA siphon moderate supraclinoid stenosis.  Patient did receive TNK.  Underwent cerebral arteriogram attempted unsuccessful thrombectomy 05/24/2024 per Dr. Nancyann Burns.  CTA angio of the head follow-up showed early right PCA territory cytotoxic edema without hemorrhagic transformation or mass effect.  A follow-up MRI was completed showing large acute right PCA infarct with additional scattered small acute bilateral cerebral and cerebellar infarcts.  Moderate cerebral atrophy with mild chronic small vessel ischemic disease with chronic lacunar infarct.  Admission chemistries unremarkable except creatinine 1.28.  Echocardiogram ejection fraction of 60 to 65% no wall motion abnormalities.  Initially placed on aspirin  and Plavix for CVA prophylaxis.  Hospital course cardiology services consulted 10/23 for evaluation of atrial flutter and patient ultimately has been placed on Eliquis for both CVA prophylaxis  and a flutter and aspirin  and Plavix discontinued.  Palliative care consulted to establish goals of care.  Her diet has been advanced to a regular consistency.   Pt doesn't remember LBM, but per daughter has been in the last 24 hours.  Denies any pain.  Daughter helped her with LLE ADLs due to L THA in 01/2024 and hx of L TKA prior.  Quetiapine helped her sleep Pt feels like had NOT had a stroke and cannot understand why vision worse in L eye- doesn't feel weak and denies Sx's of stroke, in spite of daughter telling her.     Review of Systems  Constitutional:  Negative for chills and fever.  HENT:  Positive for hearing loss.   Eyes:        Poor vision due to macular degeneration  Respiratory:  Negative for cough.        Shortness of breath with heavy exertion  Cardiovascular:  Negative for chest pain, palpitations and leg swelling.  Gastrointestinal:  Positive for constipation. Negative for heartburn, nausea and vomiting.       GERD  Genitourinary:  Negative for dysuria, flank pain and hematuria.  Musculoskeletal:  Positive for joint pain and myalgias.  Neurological:  Positive for weakness.  Psychiatric/Behavioral:  Positive for depression and memory loss.   All other systems reviewed and are negative.  Past Medical History:  Diagnosis Date   Acid reflux    Arthritis    Carotid artery disease    50-70% left internal carotid artery stenosis   Depression    Dyspnea    Hypothyroidism    Macular degeneration    Thyroid  condition    Visual changes    Past Surgical History:  Procedure Laterality Date   CATARACT EXTRACTION, BILATERAL     CHOLECYSTECTOMY  COLONOSCOPY     ESOPHAGOGASTRODUODENOSCOPY ENDOSCOPY     IR ANGIO VERTEBRAL SEL SUBCLAVIAN INNOMINATE UNI R MOD SED  05/24/2024   IR PERCUTANEOUS ART THROMBECTOMY/INFUSION INTRACRANIAL INC DIAG ANGIO  05/24/2024   IR US  GUIDE VASC ACCESS RIGHT  05/24/2024   RADIOLOGY WITH ANESTHESIA N/A 05/24/2024   Procedure: RADIOLOGY  WITH ANESTHESIA;  Surgeon: Radiologist, Medication, MD;  Location: [facility] OR;  Service: Radiology;  Laterality: N/A;   TOTAL HIP ARTHROPLASTY Left 01/26/2024   Procedure: ARTHROPLASTY, HIP, TOTAL, ANTERIOR APPROACH;  Surgeon: Ernie Cough, MD;  Location: [facility];  Service: Orthopedics;  Laterality: Left;   TOTAL KNEE ARTHROPLASTY Left 06/26/2020   Procedure: TOTAL KNEE ARTHROPLASTY;  Surgeon: Ernie Cough, MD;  Location: [facility];  Service: Orthopedics;  Laterality: Left;  70 mins   Family History  Problem Relation Age of Onset   Hypertension Father    Social History:  reports that she has never smoked. She has never used smokeless tobacco. She reports that she does not drink alcohol and does not use drugs. Allergies: No Known Allergies Medications Prior to Admission  Medication Sig Dispense Refill   ADVIL 200 MG CAPS Take 200-400 mg by mouth 2 (two) times daily as needed (for pain).     Calcium -Vitamin D 600-200 MG-UNIT per tablet Take 1 tablet by mouth daily.     cetirizine (ZYRTEC) 10 MG tablet Take 10 mg by mouth daily.     ezetimibe  (ZETIA ) 10 MG tablet Take 10 mg by mouth every other day.     furosemide  (LASIX ) 40 MG tablet Take 40 mg by mouth daily.     glucosamine-chondroitin 500-400 MG tablet Take 1 tablet by mouth daily.     ketoconazole (NIZORAL) 2 % cream Apply 1 Application topically 2 (two) times daily as needed for irritation.     levothyroxine  (SYNTHROID ) 100 MCG tablet Take 100 mcg by mouth daily before breakfast.     memantine  (NAMENDA ) 5 MG tablet Take 5 mg by mouth 2 (two) times daily.     mirtazapine  (REMERON ) 15 MG tablet Take 15 mg by mouth at bedtime.     Multiple Vitamin (MULTIVITAMIN WITH MINERALS) TABS tablet Take 1 tablet by mouth daily.     Multiple Vitamins-Minerals (ICAPS AREDS 2 PO) Take 1 capsule by mouth 2 (two) times daily.     omeprazole  (PRILOSEC) 40 MG capsule Take 40 mg by mouth daily before breakfast.     potassium chloride  (KLOR-CON  M) 10 MEQ tablet Take  5 mEq by mouth every evening.     TYLENOL  500 MG tablet Take 500 mg by mouth 2 (two) times daily as needed (for pain).        Home: Home Living Family/patient expects to be discharged to:: Unsure (per daughter anassessment close to discharge) Living Arrangements: Children Available Help at Discharge: Family Type of Home: House Home Access: Stairs to enter Secretary/administrator of Steps: 6 Entrance Stairs-Rails: Right, Left, Can reach both Home Layout: One level Bathroom Shower/Tub: Engineer, Manufacturing Systems: Handicapped height Bathroom Accessibility: Yes Home Equipment: Agricultural Consultant (2 wheels), Rollator (4 wheels), Cane - single point Additional Comments: has 2 cats Careers Adviser and simon  Lives With: Other (Comment)   Functional History: Prior Function Prior Level of Function : Independent/Modified Independent Mobility Comments: rollator for grocery shopping, no AD in the house ADLs Comments: indep with bathing, pt does how work like making her bed and cleaning the litter box  Functional Status:  Mobility: Bed Mobility Overal bed mobility: Needs  Assistance Bed Mobility: Supine to Sit Rolling: Max assist, +2 for physical assistance Sidelying to sit: Max assist, +2 for physical assistance Supine to sit: Min assist, Used rails, HOB elevated, Mod assist Sit to supine: Max assist, +2 for physical assistance General bed mobility comments: increased cues for technique and problem solving. Min A for trunk elevation and mod A to scoot forward at EOB Transfers Overall transfer level: Needs assistance Equipment used: Rolling walker (2 wheels) Transfers: Sit to/from Stand, Bed to chair/wheelchair/BSC Sit to Stand: Mod assist Bed to/from chair/wheelchair/BSC transfer type:: Step pivot Step pivot transfers: Mod assist Transfer via Lift Equipment: Stedy General transfer comment: Mod A from EOB x1 and recliner x2 for power up and anterior weight shift. Cues for hand placement. Assist  for stability and RW management to pivot to recliner. Pt sitting quickly with decreased control requiring assist once in front of the chair Ambulation/Gait Ambulation/Gait assistance: Min assist Gait Distance (Feet): 5 Feet (5' x 2 trials) Assistive device: Rolling walker (2 wheels) Gait Pattern/deviations: Step-to pattern, Shuffle, Trunk flexed General Gait Details: unable due to LBP and short standing tolerance Gait velocity: reduced Gait velocity interpretation: <1.31 ft/sec, indicative of household ambulator    ADL: ADL Overall ADL's : Needs assistance/impaired Eating/Feeding: Set up, Sitting Eating/Feeding Details (indicate cue type and reason): assistance with opening containers Grooming: Wash/dry hands, Wash/dry face, Sitting, Supervision/safety, Cueing for sequencing Upper Body Bathing: Maximal assistance, Bed level Lower Body Bathing: Total assistance Upper Body Dressing : Maximal assistance Lower Body Dressing: Total assistance Toilet Transfer: Maximal assistance, +2 for physical assistance, +2 for safety/equipment (sit <>stand)  Cognition: Cognition Overall Cognitive Status: Within Functional Limits for tasks assessed Arousal/Alertness: Awake/alert Orientation Level: Oriented X4 Year: 2025 Month: October Day of Week: Incorrect Attention: Sustained Sustained Attention: Appears intact Awareness: Impaired Awareness Impairment: Intellectual impairment, Emergent impairment, Anticipatory impairment Safety/Judgment: Impaired Cognition Arousal: Alert Behavior During Therapy: Flat affect, Agitated Overall Cognitive Status: Within Functional Limits for tasks assessed  Physical Exam: Blood pressure (!) 148/80, pulse 69, temperature 98 F (36.7 C), resp. rate 18, weight 77.4 kg, SpO2 96%. Physical Exam Vitals and nursing note reviewed. Exam conducted with a chaperone present.  Constitutional:      General: She is not in acute distress.    Appearance: She is obese.      Comments: Pt awake, alert, sitting up in bed; but strong lean to Left side; daughter Zebedee in room at bedside, NAD  HENT:     Head: Normocephalic and atraumatic.     Comments: Facial sensation intact Mouth equal to very trace L sided  facial droop- only noticeable when frowns Tongue midline    Right Ear: External ear normal.     Left Ear: External ear normal.     Nose: Nose normal. No congestion.     Mouth/Throat:     Mouth: Mucous membranes are dry.     Pharynx: Oropharynx is clear. No oropharyngeal exudate.  Eyes:     General:        Right eye: No discharge.        Left eye: No discharge.     Extraocular Movements: Extraocular movements intact.     Comments: Can look Left,  (R gaze preference, but when daughter dropped something on floor to left, immediately, eyes went all the way left) L hemianopia  it appears- cannot see fingers on L side, but also had german measles as child affecting l eye and macular degeneration  Cardiovascular:  Rate and Rhythm: Normal rate. Rhythm irregular.     Heart sounds: Normal heart sounds. No murmur heard.    No gallop.  Pulmonary:     Effort: Pulmonary effort is normal. No respiratory distress.     Breath sounds: Normal breath sounds. No wheezing, rhonchi or rales.  Abdominal:     General: There is no distension.     Palpations: Abdomen is soft.     Tenderness: There is no abdominal tenderness.     Comments: Hypoactive BS  Musculoskeletal:        General: Normal range of motion.     Cervical back: Neck supple.     Comments:  RUE and RLE 5/5- very strong for age LUE/LLE- 5-/5 throughout- when tested, very strong, but slightly less than R side  Skin:    General: Skin is warm and dry.     Comments: Many bruises throughout esp LUE distally and R inner thigh   Neurological:     Mental Status: She is alert.     Comments: Patient was alert.  Not cooperative during exam.  Follows simple commands.  Very little verbal output at would not make  eye contact. Pt made eye contact when asked Leaning dramatically to left Knew was in [facility] in [place] was October 2025 and initially said in 20's, but realized was 10/30 on her own. Naming intact x3 Can repeat me No increased tone or Clonus/Hoffman's  Psychiatric:     Comments: Flat, a little grumpy but could provoke her to smile intermittently     Results for orders placed or performed during the hospital encounter of 05/24/24 (from the past 48 hours)  Basic metabolic panel with GFR     Status: Abnormal   Collection Time: 06/01/24  1:33 AM  Result Value Ref Range   Sodium 140 135 - 145 mmol/L   Potassium 4.4 3.5 - 5.1 mmol/L   Chloride 109 98 - 111 mmol/L   CO2 21 (L) 22 - 32 mmol/L   Glucose, Bld 93 70 - 99 mg/dL    Comment: Glucose reference range applies only to samples taken after fasting for at least 8 hours.   BUN 17 8 - 23 mg/dL   Creatinine, Ser 8.73 (H) 0.44 - 1.00 mg/dL   Calcium  8.6 (L) 8.9 - 10.3 mg/dL   GFR, Estimated 42 (L) >60 mL/min    Comment: (NOTE) Calculated using the CKD-EPI Creatinine Equation (2021)    Anion gap 10 5 - 15    Comment: Performed at [facility], [address] [place], [place] [address]  CBC     Status: None   Collection Time: 06/01/24  1:33 AM  Result Value Ref Range   WBC 6.8 4.0 - 10.5 K/uL   RBC 4.09 3.87 - 5.11 MIL/uL   Hemoglobin 12.2 12.0 - 15.0 g/dL   HCT 62.2 63.9 - 53.9 %   MCV 92.2 80.0 - 100.0 fL   MCH 29.8 26.0 - 34.0 pg   MCHC 32.4 30.0 - 36.0 g/dL   RDW 84.6 88.4 - 84.4 %   Platelets 288 150 - 400 K/uL   nRBC 0.0 0.0 - 0.2 %    Comment: Performed at [facility], [address], [place], [place] [address]   No results found.    Blood pressure (!) 148/80, pulse 69, temperature 98 F (36.7 C), resp. rate 18, weight 77.4 kg, SpO2 96%.  Medical Problem List and Plan: 1. Functional deficits  secondary to right PCA large infarct with multiple small scattered infarcts in bilateral  hemispheres with R P1 occlusion status post IR but unsuccessful due to tortuosity of vessel  -patient may  shower  -ELOS/Goals: 14-16 days supervision of PT, OT and SLP  Admit to CIR- appropriate 2.  Antithrombotics: -DVT/anticoagulation:  Pharmaceutical: Eliquis for A flutter  -antiplatelet therapy: N/A 3. Pain Management: Robaxin  500 mg every 8 hours as needed 4. Mood/Behavior/Sleep: Namenda  5 mg twice daily, Remeron  15 mg nightly, Xanax 0.25 mg twice daily as needed  -antipsychotic agents: Seroquel 12.5 mg nightly 5. Neuropsych/cognition: This patient is not capable of making decisions on her own behalf due to vascular dementia 6. Skin/Wound Care: Routine skin checks 7. Fluids/Electrolytes/Nutrition: Routine and analysis with follow-up chemistries 8.  Hypertension.  Norvasc 5 mg daily.  Monitor with increased mobility 9.  Hyperlipidemia.  Lipitor 10.  GERD.  Protonix  11.  Hypothyroidism.  Synthroid  100 mcg daily 12. Hx of german measles of L eye as child and macular degeneration- not clear why cannot see out of L eye- could be old vs hemianopia 13. Vascular dementia- on namenda  14. Hx of L THA in 01/2024- and prior L TKA 15. Aflutter- on Eliquis- doesn't need rate control per Cards 16. Insomnia- Con't Quetiapine     Toribio JINNY Pitch, PA-C 06/02/2024   I have personally performed a face to face diagnostic evaluation of this patient and formulated the key components of the plan.  Additionally, I have personally reviewed laboratory data, imaging studies, as well as relevant notes and concur with the physician assistant's documentation above.   The patient's status has not changed from the original H&P.  Any changes in documentation from the acute care chart have been noted above.

## 2024-06-02 NOTE — Plan of Care (Signed)
  Problem: Clinical Measurements: Goal: Ability to maintain clinical measurements within normal limits will improve Outcome: Progressing Goal: Will remain free from infection Outcome: Progressing Goal: Diagnostic test results will improve Outcome: Progressing Goal: Respiratory complications will improve Outcome: Progressing Goal: Cardiovascular complication will be avoided Outcome: Progressing   Problem: Nutrition: Goal: Adequate nutrition will be maintained Outcome: Progressing   Problem: Elimination: Goal: Will not experience complications related to bowel motility Outcome: Progressing Goal: Will not experience complications related to urinary retention Outcome: Progressing   Problem: Pain Managment: Goal: General experience of comfort will improve and/or be controlled Outcome: Progressing   Problem: Safety: Goal: Ability to remain free from injury will improve Outcome: Progressing

## 2024-06-02 NOTE — Progress Notes (Signed)
   06/02/24 1620  [place] Suicide Severity Rating Scale  Patient location: In-patient Unit  IP Suicide Precautions Interventions  IP Suicide Precaution Interventions Low Risk Interventions implemented  Risk and Protective Factors  Activating Events  (hopes of the lord taking her during her sleep since the stroke)  Describe Activating Events associated with loss/negative event CVA  Clinical Status (Recent) Perceived burden on family or others

## 2024-06-02 NOTE — Progress Notes (Signed)
 Patient going to [phone]. Report given to [place], CHARITY FUNDRAISER.

## 2024-06-02 NOTE — Progress Notes (Signed)
 Occupational Therapy Treatment Patient Details Name: Donna Petty MRN: [id number] DOB: Sep 22, 1935 Today's Date: 06/02/2024   History of present illness Donna Petty is a 88 y.o. female who was at home with her daughter when she had sudden onset left-sided symptoms 10/21. 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 MRI with large acute right PCA infarct and additional scattered small acute bilateral cerebral and cerebellar infarcts. PMH: hypothyroidism, macular degeneration, carotid artery disease, depression   OT comments  Patient demonstrating good gains with OT treatment with bed mobility, transfers, and self care. Patient demonstrating gains with visual scanning to left with patient able to locate all self care items place on left without verbal cues.  Patient will benefit from intensive inpatient follow-up therapy, >3 hours/day.  Acute OT to continue to follow to address established goals to facilitate DC to next venue of care.        If plan is discharge home, recommend the following:  A lot of help with walking and/or transfers;A lot of help with bathing/dressing/bathroom   Equipment Recommendations  Wheelchair (measurements OT);Wheelchair cushion (measurements OT);BSC/3in1    Recommendations for Other Services      Precautions / Restrictions Precautions Precautions: Fall Recall of Precautions/Restrictions: Impaired Precaution/Restrictions Comments: L neglect, SBP <180       Mobility Bed Mobility Overal bed mobility: Needs Assistance Bed Mobility: Supine to Sit     Supine to sit: Min assist, Used rails, HOB elevated     General bed mobility comments: increased time and min assist to raise trunk with patient pulling up with therapist    Transfers Overall transfer level: Needs assistance Equipment used: Rolling walker (2 wheels) Transfers: Sit to/from Stand, Bed to chair/wheelchair/BSC Sit to Stand: Mod assist     Step pivot  transfers: Mod assist     General transfer comment: mod assist to power up and for balance with transfers     Balance Overall balance assessment: Needs assistance Sitting-balance support: No upper extremity supported, Feet supported Sitting balance-Leahy Scale: Fair Sitting balance - Comments: CGA on EOB   Standing balance support: Bilateral upper extremity supported, Reliant on assistive device for balance, During functional activity Standing balance-Leahy Scale: Poor Standing balance comment: reliant on UE support                           ADL either performed or assessed with clinical judgement   ADL Overall ADL's : Needs assistance/impaired     Grooming: Wash/dry hands;Wash/dry face;Oral care;Brushing hair;Set up;Sitting Grooming Details (indicate cue type and reason): patient able to locate all items on the left             Lower Body Dressing: Maximal assistance;Sitting/lateral leans   Toilet Transfer: Moderate assistance;Rolling walker (2 wheels) Toilet Transfer Details (indicate cue type and reason): simulated to recliner                Extremity/Trunk Assessment              Vision       Perception     Praxis     Communication Communication Communication: Impaired Factors Affecting Communication: Difficulty expressing self   Cognition Arousal: Alert Behavior During Therapy: Flat affect Cognition: Cognition impaired     Awareness: Intellectual awareness intact, Online awareness impaired     Executive functioning impairment (select all impairments): Initiation OT - Cognition Comments: decreased left neglect  Following commands: Impaired Following commands impaired: Follows one step commands with increased time      Cueing   Cueing Techniques: Verbal cues, Tactile cues, Visual cues  Exercises      Shoulder Instructions       General Comments VSS    Pertinent Vitals/ Pain       Pain  Assessment Pain Assessment: Faces Faces Pain Scale: Hurts a little bit Pain Location: back Pain Descriptors / Indicators: Aching, Guarding Pain Intervention(s): Limited activity within patient's tolerance, Monitored during session, Repositioned  Home Living                                          Prior Functioning/Environment              Frequency  Min 2X/week        Progress Toward Goals  OT Goals(current goals can now be found in the care plan section)  Progress towards OT goals: Progressing toward goals  Acute Rehab OT Goals Patient Stated Goal: to rest OT Goal Formulation: With patient Time For Goal Achievement: 06/08/24 Potential to Achieve Goals: Good ADL Goals Pt Will Perform Eating: with min assist;sitting Pt Will Perform Grooming: with min assist;sitting Pt Will Perform Upper Body Bathing: with min assist;sitting Pt Will Transfer to Toilet: with +2 assist;with mod assist;stand pivot transfer Additional ADL Goal #1: pt will locate 3 objects in L visual field with min Cues  Plan      Co-evaluation                 AM-PAC OT 6 Clicks Daily Activity     Outcome Measure   Help from another person eating meals?: A Little Help from another person taking care of personal grooming?: A Lot Help from another person toileting, which includes using toliet, bedpan, or urinal?: A Lot Help from another person bathing (including washing, rinsing, drying)?: A Lot Help from another person to put on and taking off regular upper body clothing?: A Lot Help from another person to put on and taking off regular lower body clothing?: A Lot 6 Click Score: 13    End of Session Equipment Utilized During Treatment: Gait belt;Rolling walker (2 wheels)  OT Visit Diagnosis: Unsteadiness on feet (R26.81);Muscle weakness (generalized) (M62.81);Hemiplegia and hemiparesis Hemiplegia - Right/Left: Left Hemiplegia - dominant/non-dominant:  Non-Dominant Hemiplegia - caused by: Cerebral infarction   Activity Tolerance Patient tolerated treatment well   Patient Left in chair;with call bell/phone within reach;with chair alarm set   Nurse Communication Mobility status        Time: 8969-8951 OT Time Calculation (min): 18 min  Charges: OT General Charges $OT Visit: 1 Visit OT Treatments $Self Care/Home Management : 8-22 mins  Dick Laine, OTA Acute Rehabilitation Services  Office [phone]   Jeb LITTIE Laine 06/02/2024, 1:24 PM

## 2024-06-02 NOTE — Discharge Summary (Addendum)
 Stroke Discharge Summary  Patient ID: Donna Petty   MRN: [id number]      DOB: 1936-04-11  Date of Admission: 05/24/2024 Date of Discharge: 06/02/2024  Attending Physician:  Stroke, Md, MD, Stroke MD Patient's PCP:  Corlis Pagan, NP  Discharge Diagnoses:  Right PCA large infarct with multiple small scattered infarcts in bilateral hemispheres with R P1 occlusion s/p IR but unsuccessful, etiology: cardioembolic from new diagnosed atrial flutter   Secondary diagnosis A flutter Anxiety and agitation Vascular dementia AKI HTN HLD Obesity   Medications to be continued on Rehab Allergies as of 06/02/2024   No Known Allergies      Medication List     STOP taking these medications    Advil 200 MG Caps Generic drug: Ibuprofen   ezetimibe  10 MG tablet Commonly known as: ZETIA    furosemide  40 MG tablet Commonly known as: LASIX    TYLENOL  500 MG tablet Generic drug: acetaminophen        TAKE these medications    amLODipine 5 MG tablet Commonly known as: NORVASC Take 1 tablet (5 mg total) by mouth daily. Start taking on: June 03, 2024   apixaban 5 MG Tabs tablet Commonly known as: ELIQUIS Take 1 tablet (5 mg total) by mouth 2 (two) times daily.   atorvastatin 20 MG tablet Commonly known as: LIPITOR Take 1 tablet (20 mg total) by mouth daily. Start taking on: June 03, 2024   Calcium -Vitamin D 600-200 MG-UNIT tablet Take 1 tablet by mouth daily.   cetirizine 10 MG tablet Commonly known as: ZYRTEC Take 10 mg by mouth daily.   feeding supplement Liqd Take 237 mLs by mouth 2 (two) times daily between meals.   glucosamine-chondroitin 500-400 MG tablet Take 1 tablet by mouth daily.   ICAPS AREDS 2 PO Take 1 capsule by mouth 2 (two) times daily.   ketoconazole 2 % cream Commonly known as: NIZORAL Apply 1 Application topically 2 (two) times daily as needed for irritation.   levothyroxine  100 MCG tablet Commonly known as: SYNTHROID  Take 100 mcg by  mouth daily before breakfast.   memantine  5 MG tablet Commonly known as: NAMENDA  Take 5 mg by mouth 2 (two) times daily.   methocarbamol  500 MG tablet Commonly known as: ROBAXIN  Take 1 tablet (500 mg total) by mouth every 8 (eight) hours as needed for muscle spasms.   mirtazapine  15 MG tablet Commonly known as: REMERON  Take 15 mg by mouth at bedtime.   multivitamin with minerals Tabs tablet Take 1 tablet by mouth daily.   omeprazole  40 MG capsule Commonly known as: PRILOSEC Take 40 mg by mouth daily before breakfast.   polyethylene glycol 17 g packet Commonly known as: MIRALAX  / GLYCOLAX  Take 17 g by mouth 2 (two) times daily.   potassium chloride  10 MEQ tablet Commonly known as: KLOR-CON  M Take 5 mEq by mouth every evening.   senna-docusate 8.6-50 MG tablet Commonly known as: Senokot-S Take 1 tablet by mouth at bedtime as needed for mild constipation or moderate constipation.        LABORATORY STUDIES CBC    Component Value Date/Time   WBC 6.8 06/01/2024 0133   RBC 4.09 06/01/2024 0133   HGB 12.2 06/01/2024 0133   HCT 37.7 06/01/2024 0133   PLT 288 06/01/2024 0133   MCV 92.2 06/01/2024 0133   MCH 29.8 06/01/2024 0133   MCHC 32.4 06/01/2024 0133   RDW 15.3 06/01/2024 0133   LYMPHSABS 2.2 05/24/2024 1056   MONOABS  0.7 05/24/2024 1056   EOSABS 0.2 05/24/2024 1056   BASOSABS 0.0 05/24/2024 1056   CMP    Component Value Date/Time   NA 140 06/01/2024 0133   K 4.4 06/01/2024 0133   CL 109 06/01/2024 0133   CO2 21 (L) 06/01/2024 0133   GLUCOSE 93 06/01/2024 0133   BUN 17 06/01/2024 0133   CREATININE 1.26 (H) 06/01/2024 0133   CALCIUM  8.6 (L) 06/01/2024 0133   PROT 6.2 (L) 05/24/2024 1056   ALBUMIN 3.3 (L) 05/24/2024 1056   AST 20 05/24/2024 1056   ALT 17 05/24/2024 1056   ALKPHOS 91 05/24/2024 1056   BILITOT 0.8 05/24/2024 1056   GFRNONAA 42 (L) 06/01/2024 0133   COAGS Lab Results  Component Value Date   INR 1.0 05/24/2024   Lipid Panel     Component Value Date/Time   CHOL 168 05/25/2024 0308   TRIG 167 (H) 05/25/2024 0308   HDL 40 (L) 05/25/2024 0308   CHOLHDL 4.2 05/25/2024 0308   VLDL 33 05/25/2024 0308   LDLCALC 95 05/25/2024 0308   HgbA1C  Lab Results  Component Value Date   HGBA1C 5.4 05/24/2024   Urinalysis No results found for: COLORURINE, APPEARANCEUR, LABSPEC, PHURINE, GLUCOSEU, HGBUR, BILIRUBINUR, KETONESUR, PROTEINUR, UROBILINOGEN, NITRITE, LEUKOCYTESUR Urine Drug Screen No results found for: LABOPIA, COCAINSCRNUR, LABBENZ, AMPHETMU, THCU, LABBARB  Alcohol Level    Component Value Date/Time   ETH <15 05/24/2024 1056     SIGNIFICANT DIAGNOSTIC STUDIES CT HEAD WO CONTRAST ( ) Result Date: 05/29/2024 EXAM: CT HEAD WITHOUT CONTRAST 05/29/2024 04:29:21 PM TECHNIQUE: CT of the head was performed without the administration of intravenous contrast. Automated exposure control, iterative reconstruction, and/or weight based adjustment of the mA/kV was utilized to reduce the radiation dose to as low as reasonably achievable. COMPARISON: None available. CLINICAL HISTORY: Stroke, follow up. FINDINGS: BRAIN AND VENTRICLES: No acute hemorrhage. Evolving right PCA territory infarct. No evidence of acute hemorrhage or progressive mass effect. Remote right basal ganglia lacunar infarct. Small remote right greater than left cerebellar infarcts. No hydrocephalus. No extra-axial collection. No midline shift. ORBITS: No acute abnormality. SINUSES: No acute abnormality. SOFT TISSUES AND SKULL: No acute soft tissue abnormality. No skull fracture. IMPRESSION: 1. Evolving right PCA territory infarct. No evidence of acute hemorrhage or progressive mass effect. Electronically signed by: Gilmore Molt MD 05/29/2024 05:45 PM EDT RP Workstation: [id number]   ECHOCARDIOGRAM COMPLETE Result Date: 05/25/2024    ECHOCARDIOGRAM REPORT   Patient Name:   GUIDA ASMAN Schriefer Date of Exam: 05/25/2024 Medical Rec #:   [id number]     Height:       61.5 in Accession #:    [id number]    Weight:       170.6 lb Date of Birth:  [birth year]     BSA:          1.776 m Patient Age:    88 years      BP:           140/97 mmHg Patient Gender: F             HR:           84 bpm. Exam Location:  Inpatient Procedure: 2D Echo (Both Spectral and Color Flow Doppler were utilized during            procedure). Indications:   Stroke  History:       Patient has no prior history of Echocardiogram examinations.  Stroke.  Sonographer:   Norleen Amour Referring      [id number] [facility] Phys: IMPRESSIONS  1. Left ventricular ejection fraction, by estimation, is 60 to 65%. The left ventricle has normal function. The left ventricle has no regional wall motion abnormalities. There is mild asymmetric left ventricular hypertrophy of the basal-septal segment. Left ventricular diastolic parameters were normal.  2. Right ventricular systolic function is normal. The right ventricular size is normal. There is normal pulmonary artery systolic pressure.  3. The mitral valve is normal in structure. Mild mitral valve regurgitation. No evidence of mitral stenosis.  4. The aortic valve is grossly normal. There is mild calcification of the aortic valve. Aortic valve regurgitation is not visualized. Aortic valve sclerosis is present, with no evidence of aortic valve stenosis.  5. The inferior vena cava is normal in size with greater than 50% respiratory variability, suggesting right atrial pressure of 3 mmHg. Comparison(s): No prior Echocardiogram. Conclusion(s)/Recommendation(s): Otherwise normal echocardiogram, with minor abnormalities described in the report. No intracardiac source of embolism detected on this transthoracic study. Consider a transesophageal echocardiogram to exclude cardiac source of embolism if clinically indicated. FINDINGS  Left Ventricle: Left ventricular ejection fraction, by estimation, is 60 to 65%. The left ventricle has normal  function. The left ventricle has no regional wall motion abnormalities. The left ventricular internal cavity size was normal in size. There is  mild asymmetric left ventricular hypertrophy of the basal-septal segment. Left ventricular diastolic parameters were normal. Right Ventricle: The right ventricular size is normal. Right vetricular wall thickness was not well visualized. Right ventricular systolic function is normal. There is normal pulmonary artery systolic pressure. The tricuspid regurgitant velocity is 2.68 m/s, and with an assumed right atrial pressure of 3 mmHg, the estimated right ventricular systolic pressure is 31.7 mmHg. Left Atrium: Left atrial size was normal in size. Right Atrium: Right atrial size was normal in size. Pericardium: There is no evidence of pericardial effusion. Mitral Valve: The mitral valve is normal in structure. Mild mitral valve regurgitation. No evidence of mitral valve stenosis. Tricuspid Valve: The tricuspid valve is grossly normal. Tricuspid valve regurgitation is trivial. No evidence of tricuspid stenosis. Aortic Valve: The aortic valve is grossly normal. There is mild calcification of the aortic valve. Aortic valve regurgitation is not visualized. Aortic valve sclerosis is present, with no evidence of aortic valve stenosis. Pulmonic Valve: The pulmonic valve was not well visualized. Pulmonic valve regurgitation is trivial. No evidence of pulmonic stenosis. Aorta: The aortic root and ascending aorta are structurally normal, with no evidence of dilitation. Venous: The inferior vena cava is normal in size with greater than 50% respiratory variability, suggesting right atrial pressure of 3 mmHg. IAS/Shunts: The atrial septum is grossly normal.  LEFT VENTRICLE PLAX 2D LVIDd:         4.40 cm     Diastology LVIDs:         2.50 cm     LV e' medial:    6.74 cm/s LV PW:         1.00 cm     LV E/e' medial:  14.2 LV IVS:        1.30 cm     LV e' lateral:   10.80 cm/s LVOT diam:      1.80 cm     LV E/e' lateral: 8.8 LV SV:         32 LV SV Index:   18 LVOT Area:     2.54 cm LV IVRT:  74 msec  LV Volumes (MOD) LV vol d, MOD A2C: 56.8 ml LV vol d, MOD A4C: 70.6 ml LV vol s, MOD A2C: 20.3 ml LV vol s, MOD A4C: 29.6 ml LV SV MOD A2C:     36.5 ml LV SV MOD A4C:     70.6 ml LV SV MOD BP:      43.9 ml RIGHT VENTRICLE             IVC RV Basal diam:  3.90 cm     IVC diam: 1.70 cm RV S prime:     11.40 cm/s TAPSE (M-mode): 2.0 cm LEFT ATRIUM             Index        RIGHT ATRIUM           Index LA diam:        4.00 cm 2.25 cm/m   RA Area:     15.00 cm LA Vol (A2C):   46.9 ml 26.40 ml/m  RA Volume:   37.10 ml  20.89 ml/m LA Vol (A4C):   45.7 ml 25.73 ml/m LA Biplane Vol: 47.5 ml 26.74 ml/m  AORTIC VALVE             PULMONIC VALVE LVOT Vmax:   60.00 cm/s  PV Vmax:       0.72 m/s LVOT Vmean:  42.300 cm/s PV Peak grad:  2.1 mmHg LVOT VTI:    0.127 m  AORTA Ao Root diam: 2.80 cm Ao Asc diam:  3.20 cm MITRAL VALVE               TRICUSPID VALVE MV Area (PHT): 3.74 cm    TR Peak grad:   28.7 mmHg MV Decel Time: 203 msec    TR Vmax:        268.00 cm/s MV E velocity: 95.40 cm/s MV A velocity: 63.40 cm/s  SHUNTS MV E/A ratio:  1.50        Systemic VTI:  0.13 m                            Systemic Diam: 1.80 cm Shelda Bruckner MD Electronically signed by Shelda Bruckner MD Signature Date/Time: 05/25/2024/6:04:15 PM    Final    MR BRAIN WO CONTRAST Result Date: 05/25/2024 EXAM: MRI BRAIN WITHOUT CONTRAST 05/25/2024 11:22:00 AM TECHNIQUE: Multiplanar multisequence MRI of the head/brain was performed without the administration of intravenous contrast. COMPARISON: CTA head and neck 05/25/2024. CLINICAL HISTORY: Acute neuro deficit, stroke suspected; 24 hours post tnkase. FINDINGS: LIMITATIONS: The examination is mildly to moderately motion degraded. BRAIN AND VENTRICLES: There is a large acute right PCA infarct involving the right temporal and occipital lobes as well as right thalamus with  cytotoxic edema but no significant associated mass effect or hemorrhage. Additional scattered small acute infarcts are present in both cerebellar hemispheres, left temporal and occipital lobes, both parietal lobes, and posterior right frontal lobe as well as along the right caudate body. There are small chronic infarcts involving the right basal ganglia and both cerebellar hemispheres. Small chronic hemorrhages are present in the cerebellum and midbrain. Mild T2 hyperintensities elsewhere in the cerebral white matter bilaterally are nonspecific but compatible with chronic small vessel ischemic disease. There is moderate cerebral atrophy. No mass, midline shift, hydrocephalus, or extra axial fluid collection is identified. Abnormal signal is noted along the course of the proximal right PCA corresponding to the occlusion shown on  CTA. ORBITS: Bilateral cataract extraction. SINUSES AND MASTOIDS: Small right mastoid effusion. Clear paranasal sinuses. BONES AND SOFT TISSUES: Normal marrow signal. No acute soft tissue abnormality. IMPRESSION: 1. Large acute right PCA infarct. 2. Additional scattered small acute bilateral cerebral and cerebellar infarcts. 3. Moderate cerebral atrophy and mild chronic small vessel ischemic disease with chronic lacunar infarcts as above. Electronically signed by: Dasie Hamburg MD 05/25/2024 11:43 AM EDT RP Workstation: [id number]   IR PERCUTANEOUS ART THROMBECTOMY/INFUSION INTRACRANIAL INC DIAG ANGIO Result Date: 05/25/2024 INDICATION: Acute Large Vessel occlusion, right posterior cerebral artery EXAM: Mechanical thrombectomy, attempted CONSENT: Consent was obtained from the patient's daughter MEDICATIONS: No additional ANESTHESIA/SEDATION: General CONTRAST:  42mL OMNIPAQUE IOHEXOL 300 MG/ML  SOLN FLUOROSCOPY: Radiation Exposure Index (as provided by the fluoroscopic device): 985 MGy reference air Kerma COMPLICATIONS: None immediate. TECHNIQUE: Maximal Sterile Barrier Technique was  utilized including caps, mask, sterile gowns, sterile gloves, sterile drape, hand hygiene and skin antiseptic. A timeout was performed prior to the initiation of the procedure. PROCEDURE: Femoral access was obtained with ultrasound using direct visualization of the needle puncture into the artery. A 80 cm neuron max sheath was placed. A penumbra select Berenstein catheter was then used to catheterize the left vertebral artery for angiography. This demonstrated an extremely tortuous left vertebral artery. The intracranial images demonstrated occlusion of the right posterior cerebral artery. The left was patent. The basilar artery was normal. The neuron max was advanced over the penumbra select Berenstein into the origin of the left vertebral artery. An attempt was made to pass a catalyst 6 aspiration catheter over a Trevo Trak microcatheter and synchro support wire, which was unsuccessful due to tortuosity of the vertebral artery. The neuron max catheter was exchanged over a Bentson wire and a 55 cm 8 French Terumo sheath was placed for more support. An attempt was made to advance a zoom support catheter into the vertebral artery which was not successful. The neuron max was then replaced and advanced into the origin of the vertebral artery. It was then possible to advance a Vecta 46 catheter over the TREVO TRAK microcatheter and synchro support wire about midway up the vertebral artery, but no further. An Amplatz wire was then advanced through the Vecta 46 and a penumbra select Esaw was advanced over the wire which permitted advancement of the neuron max about a 3rd of the way up the vertebral artery. It was then possible to advance the Vecta 46 catheter over the TREVO TRAK microcatheter into the lower basilar artery. However, the catheter could not be advanced into the right posterior cerebral artery due to the extreme proximal tortuosity. The left vertebral artery approach was abandoned. A Simmons 2 catheter  was used for catheterization of the right subclavian artery and right vertebral origin to visualize the right vertebral artery. This also had extreme tortuosity and the decision was made not to attempt catheterization as it was not likely to be successful. Hemostasis was obtained with Angio-Seal. FINDINGS: Left vertebral: The left vertebral artery is extremely tortuous. It is otherwise normal. The intracranial images demonstrate a normal basilar artery and normal left posterior cerebral artery. There is a proximal occlusion of the right posterior cerebral artery. Right vertebral: The right vertebral artery is extremely tortuous. Otherwise normal. Left vertebral after attempted thrombectomy: There is no dissection or other complication. The intracranial images are unchanged. Right posterior cerebral artery occlusion but no other occlusion. IMPRESSION: Right posterior cerebral artery embolus Embolectomy unsuccessful due to extreme proximal tortuosity of the vertebral  arteries. Codes: 62784, [id number], [id number] Electronically Signed   By: Nancyann Burns M.D.   On: 05/25/2024 08:51   IR US  Guide Vasc Access Right Result Date: 05/25/2024 INDICATION: Acute Large Vessel occlusion, right posterior cerebral artery EXAM: Mechanical thrombectomy, attempted CONSENT: Consent was obtained from the patient's daughter MEDICATIONS: No additional ANESTHESIA/SEDATION: General CONTRAST:  42mL OMNIPAQUE IOHEXOL 300 MG/ML  SOLN FLUOROSCOPY: Radiation Exposure Index (as provided by the fluoroscopic device): 985 MGy reference air Kerma COMPLICATIONS: None immediate. TECHNIQUE: Maximal Sterile Barrier Technique was utilized including caps, mask, sterile gowns, sterile gloves, sterile drape, hand hygiene and skin antiseptic. A timeout was performed prior to the initiation of the procedure. PROCEDURE: Femoral access was obtained with ultrasound using direct visualization of the needle puncture into the artery. A 80 cm neuron max sheath was placed.  A penumbra select Berenstein catheter was then used to catheterize the left vertebral artery for angiography. This demonstrated an extremely tortuous left vertebral artery. The intracranial images demonstrated occlusion of the right posterior cerebral artery. The left was patent. The basilar artery was normal. The neuron max was advanced over the penumbra select Berenstein into the origin of the left vertebral artery. An attempt was made to pass a catalyst 6 aspiration catheter over a Trevo Trak microcatheter and synchro support wire, which was unsuccessful due to tortuosity of the vertebral artery. The neuron max catheter was exchanged over a Bentson wire and a 55 cm 8 French Terumo sheath was placed for more support. An attempt was made to advance a zoom support catheter into the vertebral artery which was not successful. The neuron max was then replaced and advanced into the origin of the vertebral artery. It was then possible to advance a Vecta 46 catheter over the TREVO TRAK microcatheter and synchro support wire about midway up the vertebral artery, but no further. An Amplatz wire was then advanced through the Vecta 46 and a penumbra select Esaw was advanced over the wire which permitted advancement of the neuron max about a 3rd of the way up the vertebral artery. It was then possible to advance the Vecta 46 catheter over the TREVO TRAK microcatheter into the lower basilar artery. However, the catheter could not be advanced into the right posterior cerebral artery due to the extreme proximal tortuosity. The left vertebral artery approach was abandoned. A Simmons 2 catheter was used for catheterization of the right subclavian artery and right vertebral origin to visualize the right vertebral artery. This also had extreme tortuosity and the decision was made not to attempt catheterization as it was not likely to be successful. Hemostasis was obtained with Angio-Seal. FINDINGS: Left vertebral: The left  vertebral artery is extremely tortuous. It is otherwise normal. The intracranial images demonstrate a normal basilar artery and normal left posterior cerebral artery. There is a proximal occlusion of the right posterior cerebral artery. Right vertebral: The right vertebral artery is extremely tortuous. Otherwise normal. Left vertebral after attempted thrombectomy: There is no dissection or other complication. The intracranial images are unchanged. Right posterior cerebral artery occlusion but no other occlusion. IMPRESSION: Right posterior cerebral artery embolus Embolectomy unsuccessful due to extreme proximal tortuosity of the vertebral arteries. Codes: 62784, [id number], [id number] Electronically Signed   By: Nancyann Burns M.D.   On: 05/25/2024 08:51   IR ANGIO VERTEBRAL SEL SUBCLAVIAN INNOMINATE UNI R MOD SED Result Date: 05/25/2024 INDICATION: Acute Large Vessel occlusion, right posterior cerebral artery EXAM: Mechanical thrombectomy, attempted CONSENT: Consent was obtained from the patient's daughter MEDICATIONS: No  additional ANESTHESIA/SEDATION: General CONTRAST:  42mL OMNIPAQUE IOHEXOL 300 MG/ML  SOLN FLUOROSCOPY: Radiation Exposure Index (as provided by the fluoroscopic device): 985 MGy reference air Kerma COMPLICATIONS: None immediate. TECHNIQUE: Maximal Sterile Barrier Technique was utilized including caps, mask, sterile gowns, sterile gloves, sterile drape, hand hygiene and skin antiseptic. A timeout was performed prior to the initiation of the procedure. PROCEDURE: Femoral access was obtained with ultrasound using direct visualization of the needle puncture into the artery. A 80 cm neuron max sheath was placed. A penumbra select Berenstein catheter was then used to catheterize the left vertebral artery for angiography. This demonstrated an extremely tortuous left vertebral artery. The intracranial images demonstrated occlusion of the right posterior cerebral artery. The left was patent. The basilar artery was  normal. The neuron max was advanced over the penumbra select Berenstein into the origin of the left vertebral artery. An attempt was made to pass a catalyst 6 aspiration catheter over a Trevo Trak microcatheter and synchro support wire, which was unsuccessful due to tortuosity of the vertebral artery. The neuron max catheter was exchanged over a Bentson wire and a 55 cm 8 French Terumo sheath was placed for more support. An attempt was made to advance a zoom support catheter into the vertebral artery which was not successful. The neuron max was then replaced and advanced into the origin of the vertebral artery. It was then possible to advance a Vecta 46 catheter over the TREVO TRAK microcatheter and synchro support wire about midway up the vertebral artery, but no further. An Amplatz wire was then advanced through the Vecta 46 and a penumbra select Esaw was advanced over the wire which permitted advancement of the neuron max about a 3rd of the way up the vertebral artery. It was then possible to advance the Vecta 46 catheter over the TREVO TRAK microcatheter into the lower basilar artery. However, the catheter could not be advanced into the right posterior cerebral artery due to the extreme proximal tortuosity. The left vertebral artery approach was abandoned. A Simmons 2 catheter was used for catheterization of the right subclavian artery and right vertebral origin to visualize the right vertebral artery. This also had extreme tortuosity and the decision was made not to attempt catheterization as it was not likely to be successful. Hemostasis was obtained with Angio-Seal. FINDINGS: Left vertebral: The left vertebral artery is extremely tortuous. It is otherwise normal. The intracranial images demonstrate a normal basilar artery and normal left posterior cerebral artery. There is a proximal occlusion of the right posterior cerebral artery. Right vertebral: The right vertebral artery is extremely tortuous.  Otherwise normal. Left vertebral after attempted thrombectomy: There is no dissection or other complication. The intracranial images are unchanged. Right posterior cerebral artery occlusion but no other occlusion. IMPRESSION: Right posterior cerebral artery embolus Embolectomy unsuccessful due to extreme proximal tortuosity of the vertebral arteries. Codes: 62784, [id number], [id number] Electronically Signed   By: Nancyann Burns M.D.   On: 05/25/2024 08:51   CT ANGIO HEAD NECK W WO CM Result Date: 05/25/2024 EXAM: CTA Head and Neck with Intravenous Contrast. CT Head without Contrast. CLINICAL HISTORY: 88 year old female with headache, neuro deficit, left pupil change, and profuse vomiting. Status post code stroke presentation yesterday with right PCA occlusion. TECHNIQUE: Axial CTA images of the head and neck performed with 75 mL of iohexol (OMNIPAQUE) 350 MG/ML injection. MIP reconstructed images were created and reviewed. Axial computed tomography images of the head/brain performed without intravenous contrast. Note: Per PQRS, the description of  internal carotid artery percent stenosis, including 0 percent or normal exam, is based on [place] [employer] Symptomatic Carotid Endarterectomy Trial (NASCET) criteria. Dose reduction technique was used including one or more of the following: automated exposure control, adjustment of mA and kV according to patient size, and/or iterative reconstruction. CONTRAST: With 75 mL of iohexol (OMNIPAQUE) 350 MG/ML injection. COMPARISON: CT head and CTA head and neck 05/24/2024. FINDINGS: CT HEAD: BRAIN: Fairly subtle asymmetric hypodensity now in the right PCA territory such as on coronal image 23 of series 8. This is compatible with cytotoxic edema. No significant intracranial mass effect. No hemorrhagic transformation. Elsewhere stable noncontrast CT appearance of the brain including chronic right basal ganglia lacunar infarct. No acute intraparenchymal hemorrhage. No mass lesion. No  midline shift or extra-axial collection. VENTRICLES: No hydrocephalus. ORBITS: The orbits are unremarkable. SINUSES AND MASTOIDS: The paranasal sinuses and mastoid air cells are clear. CTA NECK: COMMON CAROTID ARTERIES: No significant stenosis. No dissection or occlusion. INTERNAL CAROTID ARTERIES: Stable right carotid bifurcation atherosclerosis resulting in estimated 60% right ICA origin stenosis. Stable left carotid bifurcation atherosclerosis without stenosis. 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 No dissection or occlusion. VERTEBRAL ARTERIES: Stable left subclavian artery and left vertebral artery origin atherosclerosis with mild stenosis of the left vertebral artery origin suspected and stable. Comparatively mild proximal right subclavian artery atherosclerosis. Both cervical vertebral arteries remain patent and highly tortuous to the skull base. No dissection or occlusion. CTA HEAD: ANTERIOR CEREBRAL ARTERIES: No significant stenosis. No occlusion. No aneurysm. MIDDLE CEREBRAL ARTERIES: No significant stenosis. No occlusion. No aneurysm. POSTERIOR CEREBRAL ARTERIES: Stable posterior circulation, unchanged right PCA P1 segment occlusion. No aneurysm. BASILAR ARTERY: No significant stenosis. No occlusion. No aneurysm. INTRACRANIAL INTERNAL CAROTID ARTERIES: Mild ICA siphon atherosclerosis. OTHER: Stable aortic arch atherosclerosis. Major dural venous sinuses are enhancing and appear to be patent. SOFT TISSUES: Visible upper chest stable nonvascular neck soft tissues are stable. No acute finding. No masses or lymphadenopathy. BONES: The osseous structures are stable from the CTA yesterday. IMPRESSION: 1. Early right PCA territory cytotoxic edema without hemorrhagic transformation or mass effect. 2. Stable CTA head and neck since yesterday. Unchanged right PCA P1 segment occlusion. 60% stenosis right ICA origin. And left ICA tortuosity and beaded appearance  suggestive of FMD. Electronically signed by: Helayne Hurst MD 05/25/2024 05:27 AM EDT RP Workstation: [id number]   CT ANGIO HEAD NECK W WO CM (CODE STROKE) Result Date: 05/24/2024 EXAM: CTA HEAD AND NECK WITHOUT AND WITH 05/24/2024 11:11:18 AM TECHNIQUE: CTA of the head and neck was performed without and with the administration of 75 mL of intravenous contrast (iohexol (OMNIPAQUE) 350 MG/ML injection 75 mL IOHEXOL 350 MG/ML SOLN). Multiplanar 2D and/or 3D reformatted images are provided for review. Automated exposure control, iterative reconstruction, and/or weight based adjustment of the mA/kV was utilized to reduce the radiation dose to as low as reasonably achievable. Stenosis of the internal carotid arteries measured using NASCET criteria. COMPARISON: CT recorded separately. CLINICAL HISTORY: 88 year old female. Neuro deficit, acute, stroke suspected. Code stroke. FINDINGS: CTA NECK: AORTIC ARCH AND ARCH VESSELS: 3 vessel aortic arch with intermittent calcified atherosclerosis. CERVICAL CAROTID ARTERIES: Mildly tortuous brachiocephalic artery and right CCA. Right ICA origin calcified plaque narrowing the vessel to 1.9 mm versus 4.7 mm normally, resulting in 59% stenosis. Tortuous right ICA below the skull base. Calcified plaque at the distal left CCA and into the left carotid bifurcation resulting in less than 50% stenosis.  Highly tortuous left ICA distal to the bulb also with a beaded appearance suggesting fibromuscular dysplasia (series 17 image 23). CERVICAL VERTEBRAL ARTERIES: Proximal right subclavian calcified plaque not resulting in significant stenosis of the proximal right subclavian artery or the right vertebral artery origin. Tortuous right vertebral artery is patent to the skull base without additional plaque. Moderate soft and calcified plaque of the proximal left subclavian artery and left vertebral artery origin resulting in less than 50% stenosis. Tortuous and codominant left vertebral artery  is patent to the skull base, mildly beaded appearance and mild V3 segment calcified plaque. LUNGS AND MEDIASTINUM: Negative visible superior mediastinum and lungs. SOFT TISSUES: Coarsely calcified left thyroid  appears benign (no follow up imaging recommended). No acute abnormality. BONES: Absent dentition, hyperostosis and degeneration in the visible spine. No acute abnormality. CTA HEAD: ANTERIOR CIRCULATION: Both ICA siphons are patent. Left siphon atherosclerosis with up to moderate supraclinoid left siphon stenosis on series 13 image 107. Right siphon plaque without significant stenosis. Patent carotid terminus, normal MCA and ACA origins. Normal anterior communicating artery with a median artery of the corpus callosum (normal variant). Bilateral MCA and ACA branches are patent. Isolated mild stenosis of right MCA M2 branches. No aneurysm. POSTERIOR CIRCULATION: Codominant distal vertebral arteries, vertebrobasilar junction, and hypo origins are patent without plaque or stenosis. Patent basilar artery. Patent PCA origins. Right PCA P1 segment is occluded abruptly (series 17 image 27), with no reconstituted flow. Left PCA is patent with minimal limits. No aneurysm. OTHER: Major dural venous sinuses appear enhancing and patent. IMPRESSION: 1. Right PCA P1 segment occlusion. This was discussed by telephone with Dr. Vanessa at 1112 hours. 2. Bilateral proximal subclavian artery and vertebral artery origin atherosclerosis without hemodynamically significant stenosis. 3. Right ICA origin plaque with 60% stenosis. Bilateral cervical ICA tortuosity and possible fibromuscular dysplasia (FMD). 4. LICA siphon moderate supraclinoid stenosis.  Mild Right M2 branch stenoses. Electronically signed by: Helayne Hurst MD 05/24/2024 11:25 AM EDT RP Workstation: [id number]   CT HEAD CODE STROKE WO CONTRAST Result Date: 05/24/2024 EXAM: CT HEAD WITHOUT CONTRAST 05/24/2024 10:57:56 AM TECHNIQUE: CT of the head was performed  without the administration of intravenous contrast. Automated exposure control, iterative reconstruction, and/or weight based adjustment of the mA/kV was utilized to reduce the radiation dose to as low as reasonably achievable. COMPARISON: Head CT 829 23. CLINICAL HISTORY: 88 year old female with acute neuro deficit, stroke suspected, right gaze, left side weakness, LKW 1000. FINDINGS: BRAIN AND VENTRICLES: No acute hemorrhage. No evidence of acute infarct. Chronic lacunar infarct of the right basal ganglia is stable. Brain volume and ventricle size are stable. Chronic basal ganglia vascular calcifications. Mild additional patchy and scattered white matter hypodensity for age as not significantly changed. No hydrocephalus. No extra-axial collection. No mass effect or midline shift. No suspicious intracranial vascular hyperdensity. ORBITS: No acute abnormality. SINUSES: No acute abnormality. SOFT TISSUES AND SKULL: Mild rightward gaze. No acute soft tissue abnormality. No skull fracture. [place] stroke program early CT score (aspects) ----- Ganglionic (caudate, ic, lentiform nucleus, insula, M1-m3): 7 Supraganglionic (m4-m6): 3 Total: 10 IMPRESSION: 1. ASPECTS 10.  Stable chronic small vessel disease by CT. 2. These results were communicated to Dr. Vanessa at 11:04 hours on 05/24/2024 by text page via the [facility] messaging system. Electronically signed by: Helayne Hurst MD 05/24/2024 11:06 AM EDT RP Workstation: [id number]       HISTORY OF PRESENT ILLNESS 88 y.o. female with history of hypothyroidism, macular degeneration and depression admitted for left  hemianopsia, right gaze deviation and left hemiparesis. She was found to have a right PCA P1 occlusion, and mechanical thrombectomy was attempted. Unfortunately, this procedure was unsuccessful due to extreme tortuosity of the vertebral artery. MRI reveals large acute right PCA infarct and a conditional scattered small bilateral acute cerebral and cerebellar  infarcts. NIH on Admission 7    HOSPITAL COURSE Stroke: Right PCA large infarct with multiple small scattered infarcts in bilateral hemispheres with R P1 occlusion s/p IR but unsuccessful, etiology: cardioembolic from new diagnosed atrial flutter   code Stroke CT head No acute abnormality. Small vessel disease. ASPECTS 10.    CTA head & neck right PCA P1 segment occlusion, right ICA origin plaque with 60% stenosis, bilateral cervical ICA tortuosity and possible FMD, moderate left ICA siphon stenosis, mild right M2 branch stenoses S/p IR but unsuccessful due to extreme tortuous VA MRI large acute right PCA infarct, additional scattered small acute bilateral cerebral and cerebellar infarcts, moderate cerebral atrophy and chronic small vessel ischemic disease CT repeat R large PCA infarct stable, no hemorrahge 2D Echo EF 60-65%, LA normal  LDL 95 HgbA1c 5.4 VTE prophylaxis - SQ heparin No antithrombotic prior to admission, continue eliquis  Therapy recommendations: CIR Disposition: CIR 10/30   A- flutter New diagnosis Cardiology consulted Replace K  Continue Eliquis   Anxiety and agitation Vascular dementia Started Xanax 0.25 mg tabled BID PRN for anxiety  Start Seroquel 12.5 mg twice daily-> Qhs Off b/l hand mittens On home nemanda and Remeron    AKI Cre 1.40--1.05--1.06--1.25-1.38-1.30-1.26 Likely due to decreased p.o. intake Continue to encourage po intake Continue IV fluid   Hypertension Home meds: None Stable Keep systolic blood pressure less than 180   Hyperlipidemia Home meds: None LDL 95, goal < 70 Continue atorvastatin 20 mg daily Continue statin at discharge   Dysphagia Patient has post-stroke dysphagia, SLP  On heart healthy diet and thin liquid Encourage po intake   Other Stroke Risk Factors Obesity, Body mass index is 31.72 kg/m., BMI >/= 30 associated with increased stroke risk, recommend weight loss, diet and exercise as appropriate   DISCHARGE  EXAM Blood pressure (!) 148/80, pulse 69, temperature 98 F (36.7 C), resp. rate 18, weight 77.4 kg, SpO2 96%.  PHYSICAL EXAM General: Ill-appearing elderly patient in no acute distress Psych:  flat CV: Regular rate and rhythm on monitor Respiratory:  Regular, unlabored respirations on room air     NEURO:  Mental Status: Drowsy. No dysarthria or aphasia. Minimal words. Not very cooperative with exam.    Cranial Nerves:  II: PERRL.  Left hemianopsia III, IV, VI: Right gaze deviation, unable to cross midline V: Sensation is intact to light touch and diminished on the left VII: Face is symmetrical resting and smiling VIII: Hearing intact to voice. IX, X: Phonation is normal XII: Tongue protrudes midline Motor: Moves all 4 extremities, upper extremities with antigravity strength, appears to be moving lowers equally but not cooperative with exam  Tone: is normal and bulk is normal Sensation- Intact to light touch on the right but absent in the left arm and diminished in the left leg Gait- deferred  Discharge NIH: 2 Discharge Diet      Diet   Diet Heart Room service appropriate? Yes with Assist; Fluid consistency: Thin   liquids  DISCHARGE PLAN Disposition:  Transfer to [facility] for ongoing PT, OT and ST Eliquis  for secondary stroke prevention. Recommend ongoing stroke risk factor control by Primary Care Physician at  time of discharge from inpatient rehabilitation. Follow-up PCP Corlis Pagan, NP in 2 weeks following discharge from rehab. Follow-up in [facility] in 8 weeks following discharge from rehab, office to schedule an appointment.   35 minutes were spent preparing discharge.  Rocky JAYSON Likes, DNP [facility]  ATTENDING NOTE: I reviewed above note and agree with the assessment and plan. Pt was seen and examined.   Pt no acute event overnight, neuro stable, unchanged from yesterday. On eliquis and statin,  stable for CIR discharge. Will follow up at [facility].   For detailed assessment and plan, please refer to above as I have made changes wherever appropriate.   Ary Cummins, MD PhD Stroke Neurology 06/02/2024 10:13 PM

## 2024-06-02 NOTE — Progress Notes (Incomplete)
 Inpatient Rehabilitation Admission Medication Review by a Pharmacist  A complete drug regimen review was completed for this patient to identify any potential clinically significant medication issues.  High Risk Drug Classes Is patient taking? Indication by Medication  Antipsychotic Yes Quetiapine- mood   Anticoagulant Yes Apixaban- CVA ppx   Antibiotic No   Opioid {Receiving?:26196}   Antiplatelet No   Hypoglycemics/insulin {Yes or No?:26198}   Vasoactive Medication {Receiving?:26196}   Chemotherapy No   Other Yes {Constipation:32046::- constipation} {Otherrehab:32047}  {Insomnia:32056::-insomnia} Lipitor- HLD  Memantine - dementia Pantoprazole - reflux  Mirtazipine- mood  Synthroid - hypothyroidism      Type of Medication Issue Identified Description of Issue Recommendation(s)  Drug Interaction(s) (clinically significant)     Duplicate Therapy     Allergy     No Medication Administration End Date     Incorrect Dose     Additional Drug Therapy Needed     Significant med changes from prior encounter (inform family/care partners about these prior to discharge). PTA meds not resumed inpt: amlodipine, robaxin , advil, cetirizine   PTA meds discontinued: Lasix , Zetia  Communicate relevant medication changes to patient/family members at discharge from CIR.   Restart or discontinue PTA meds not resumed in CIR at discharge if clinically indicated.   Other       Clinically significant medication issues were identified that warrant physician communication and completion of prescribed/recommended actions by midnight of the next day:  No  Name of provider notified for urgent issues identified:   Provider Method of Notification:     Pharmacist comments: Patient will need apixaban education prior to d/c from CIR.    Time spent performing this drug regimen review (minutes):  20

## 2024-06-02 NOTE — H&P (Signed)
 Physical Medicine and Rehabilitation Admission H&P        Chief Complaint  Patient presents with   Code Stroke  : HPI: Donna Petty is a 88 year old right-handed female with history of depression, hypothyroidism, left ICA stenosis 50-70%, hyperlipidemia, memory deficits /vascular dementia maintained on Namenda , GERD, macular degeneration.  Per chart review patient lives with her children.  1 level home 6 steps to entry.  Independent prior to admission however she will use a rollator at times within the community.  Presented 05/24/2024 with acute onset of left-sided weakness right side gaze and left hemianopsia.  Blood pressure elevated 190s systolic in the ED.  Cranial CT scan showed stable chronic small vessel disease.  CTA showed right PCA P1 segment occlusion bilateral proximal subclavian artery and vertebral artery origin atherosclerosis without hemodynamically significant stenosis.  Right ICA origin plaque with 60% stenosis.  Bilateral cervical ICA tortuosity and possible fibromuscular dysplasia.  LICA siphon moderate supraclinoid stenosis.  Patient did receive TNK.  Underwent cerebral arteriogram attempted unsuccessful thrombectomy 05/24/2024 per Dr. Nancyann Burns.  CTA angio of the head follow-up showed early right PCA territory cytotoxic edema without hemorrhagic transformation or mass effect.  A follow-up MRI was completed showing large acute right PCA infarct with additional scattered small acute bilateral cerebral and cerebellar infarcts.  Moderate cerebral atrophy with mild chronic small vessel ischemic disease with chronic lacunar infarct.  Admission chemistries unremarkable except creatinine 1.28.  Echocardiogram ejection fraction of 60 to 65% no wall motion abnormalities.  Initially placed on aspirin  and Plavix for CVA prophylaxis.  Hospital course cardiology services consulted 10/23 for evaluation of atrial flutter and patient ultimately has been placed on Eliquis for both CVA  prophylaxis and a flutter and aspirin  and Plavix discontinued.  Palliative care consulted to establish goals of care.  Her diet has been advanced to a regular consistency.     Pt doesn't remember LBM, but per daughter has been in the last 24 hours.  Denies any pain.  Daughter helped her with LLE ADLs due to L THA in 01/2024 and hx of L TKA prior.  Quetiapine helped her sleep Pt feels like had NOT had a stroke and cannot understand why vision worse in L eye- doesn't feel weak and denies Sx's of stroke, in spite of daughter telling her.        Review of Systems  Constitutional:  Negative for chills and fever.  HENT:  Positive for hearing loss.   Eyes:        Poor vision due to macular degeneration  Respiratory:  Negative for cough.        Shortness of breath with heavy exertion  Cardiovascular:  Negative for chest pain, palpitations and leg swelling.  Gastrointestinal:  Positive for constipation. Negative for heartburn, nausea and vomiting.       GERD  Genitourinary:  Negative for dysuria, flank pain and hematuria.  Musculoskeletal:  Positive for joint pain and myalgias.  Neurological:  Positive for weakness.  Psychiatric/Behavioral:  Positive for depression and memory loss.   All other systems reviewed and are negative.      Past Medical History:  Diagnosis Date   Acid reflux     Arthritis     Carotid artery disease      50-70% left internal carotid artery stenosis   Depression     Dyspnea     Hypothyroidism     Macular degeneration     Thyroid  condition  Visual changes               Past Surgical History:  Procedure Laterality Date   CATARACT EXTRACTION, BILATERAL       CHOLECYSTECTOMY       COLONOSCOPY       ESOPHAGOGASTRODUODENOSCOPY ENDOSCOPY       IR ANGIO VERTEBRAL SEL SUBCLAVIAN INNOMINATE UNI R MOD SED   05/24/2024   IR PERCUTANEOUS ART THROMBECTOMY/INFUSION INTRACRANIAL INC DIAG ANGIO   05/24/2024   IR US  GUIDE VASC ACCESS RIGHT   05/24/2024   RADIOLOGY  WITH ANESTHESIA N/A 05/24/2024    Procedure: RADIOLOGY WITH ANESTHESIA;  Surgeon: Radiologist, Medication, MD;  Location: [facility] OR;  Service: Radiology;  Laterality: N/A;   TOTAL HIP ARTHROPLASTY Left 01/26/2024    Procedure: ARTHROPLASTY, HIP, TOTAL, ANTERIOR APPROACH;  Surgeon: Ernie Cough, MD;  Location: [facility];  Service: Orthopedics;  Laterality: Left;   TOTAL KNEE ARTHROPLASTY Left 06/26/2020    Procedure: TOTAL KNEE ARTHROPLASTY;  Surgeon: Ernie Cough, MD;  Location: [facility];  Service: Orthopedics;  Laterality: Left;  70 mins             Family History  Problem Relation Age of Onset   Hypertension Father          Social History:  reports that she has never smoked. She has never used smokeless tobacco. She reports that she does not drink alcohol and does not use drugs. Allergies:  Allergies  No Known Allergies         Medications Prior to Admission  Medication Sig Dispense Refill   ADVIL 200 MG CAPS Take 200-400 mg by mouth 2 (two) times daily as needed (for pain).       Calcium -Vitamin D 600-200 MG-UNIT per tablet Take 1 tablet by mouth daily.       cetirizine (ZYRTEC) 10 MG tablet Take 10 mg by mouth daily.       ezetimibe  (ZETIA ) 10 MG tablet Take 10 mg by mouth every other day.       furosemide  (LASIX ) 40 MG tablet Take 40 mg by mouth daily.       glucosamine-chondroitin 500-400 MG tablet Take 1 tablet by mouth daily.       ketoconazole (NIZORAL) 2 % cream Apply 1 Application topically 2 (two) times daily as needed for irritation.       levothyroxine  (SYNTHROID ) 100 MCG tablet Take 100 mcg by mouth daily before breakfast.       memantine  (NAMENDA ) 5 MG tablet Take 5 mg by mouth 2 (two) times daily.       mirtazapine  (REMERON ) 15 MG tablet Take 15 mg by mouth at bedtime.       Multiple Vitamin (MULTIVITAMIN WITH MINERALS) TABS tablet Take 1 tablet by mouth daily.       Multiple Vitamins-Minerals (ICAPS AREDS 2 PO) Take 1 capsule by mouth 2 (two) times daily.        omeprazole  (PRILOSEC) 40 MG capsule Take 40 mg by mouth daily before breakfast.       potassium chloride  (KLOR-CON  M) 10 MEQ tablet Take 5 mEq by mouth every evening.       TYLENOL  500 MG tablet Take 500 mg by mouth 2 (two) times daily as needed (for pain).                  Home: Home Living Family/patient expects to be discharged to:: Unsure (per daughter anassessment close to discharge) Living Arrangements: Children Available Help at Discharge:  Family Type of Home: House Home Access: Stairs to enter [employer] of Steps: 6 Entrance Stairs-Rails: Right, Left, Can reach both Home Layout: One level Bathroom Shower/Tub: Engineer, Manufacturing Systems: Handicapped height Bathroom Accessibility: Yes Home Equipment: Agricultural Consultant (2 wheels), Rollator (4 wheels), Cane - single point Additional Comments: has 2 cats Careers Adviser and simon  Lives With: Other (Comment)   Functional History: Prior Function Prior Level of Function : Independent/Modified Independent Mobility Comments: rollator for grocery shopping, no AD in the house ADLs Comments: indep with bathing, pt does how work like making her bed and cleaning the litter box   Functional Status:  Mobility: Bed Mobility Overal bed mobility: Needs Assistance Bed Mobility: Supine to Sit Rolling: Max assist, +2 for physical assistance Sidelying to sit: Max assist, +2 for physical assistance Supine to sit: Min assist, Used rails, HOB elevated, Mod assist Sit to supine: Max assist, +2 for physical assistance General bed mobility comments: increased cues for technique and problem solving. Min A for trunk elevation and mod A to scoot forward at EOB Transfers Overall transfer level: Needs assistance Equipment used: Rolling walker (2 wheels) Transfers: Sit to/from Stand, Bed to chair/wheelchair/BSC Sit to Stand: Mod assist Bed to/from chair/wheelchair/BSC transfer type:: Step pivot Step pivot transfers: Mod assist Transfer via  Lift Equipment: Stedy General transfer comment: Mod A from EOB x1 and recliner x2 for power up and anterior weight shift. Cues for hand placement. Assist for stability and RW management to pivot to recliner. Pt sitting quickly with decreased control requiring assist once in front of the chair Ambulation/Gait Ambulation/Gait assistance: Min assist Gait Distance (Feet): 5 Feet (5' x 2 trials) Assistive device: Rolling walker (2 wheels) Gait Pattern/deviations: Step-to pattern, Shuffle, Trunk flexed General Gait Details: unable due to LBP and short standing tolerance Gait velocity: reduced Gait velocity interpretation: <1.31 ft/sec, indicative of household ambulator   ADL: ADL Overall ADL's : Needs assistance/impaired Eating/Feeding: Set up, Sitting Eating/Feeding Details (indicate cue type and reason): assistance with opening containers Grooming: Wash/dry hands, Wash/dry face, Sitting, Supervision/safety, Cueing for sequencing Upper Body Bathing: Maximal assistance, Bed level Lower Body Bathing: Total assistance Upper Body Dressing : Maximal assistance Lower Body Dressing: Total assistance Toilet Transfer: Maximal assistance, +2 for physical assistance, +2 for safety/equipment (sit <>stand)   Cognition: Cognition Overall Cognitive Status: Within Functional Limits for tasks assessed Arousal/Alertness: Awake/alert Orientation Level: Oriented X4 Year: 2025 Month: October Day of Week: Incorrect Attention: Sustained Sustained Attention: Appears intact Awareness: Impaired Awareness Impairment: Intellectual impairment, Emergent impairment, Anticipatory impairment Safety/Judgment: Impaired Cognition Arousal: Alert Behavior During Therapy: Flat affect, Agitated Overall Cognitive Status: Within Functional Limits for tasks assessed   Physical Exam: Blood pressure (!) 148/80, pulse 69, temperature 98 F (36.7 C), resp. rate 18, weight 77.4 kg, SpO2 96%. Physical Exam Vitals and nursing  note reviewed. Exam conducted with a chaperone present.  Constitutional:      General: She is not in acute distress.    Appearance: She is obese.     Comments: Pt awake, alert, sitting up in bed; but strong lean to Left side; daughter Zebedee in room at bedside, NAD  HENT:     Head: Normocephalic and atraumatic.     Comments: Facial sensation intact Mouth equal to very trace L sided  facial droop- only noticeable when frowns Tongue midline    Right Ear: External ear normal.     Left Ear: External ear normal.     Nose: Nose normal. No congestion.  Mouth/Throat:     Mouth: Mucous membranes are dry.     Pharynx: Oropharynx is clear. No oropharyngeal exudate.  Eyes:     General:        Right eye: No discharge.        Left eye: No discharge.     Extraocular Movements: Extraocular movements intact.     Comments: Can look Left,  (R gaze preference, but when daughter dropped something on floor to left, immediately, eyes went all the way left) L hemianopia  it appears- cannot see fingers on L side, but also had german measles as child affecting l eye and macular degeneration  Cardiovascular:     Rate and Rhythm: Normal rate. Rhythm irregular.     Heart sounds: Normal heart sounds. No murmur heard.    No gallop.  Pulmonary:     Effort: Pulmonary effort is normal. No respiratory distress.     Breath sounds: Normal breath sounds. No wheezing, rhonchi or rales.  Abdominal:     General: There is no distension.     Palpations: Abdomen is soft.     Tenderness: There is no abdominal tenderness.     Comments: Hypoactive BS  Musculoskeletal:        General: Normal range of motion.     Cervical back: Neck supple.     Comments:  RUE and RLE 5/5- very strong for age LUE/LLE- 5-/5 throughout- when tested, very strong, but slightly less than R side  Skin:    General: Skin is warm and dry.     Comments: Many bruises throughout esp LUE distally and R inner thigh   Neurological:     Mental  Status: She is alert.     Comments: Patient was alert.  Not cooperative during exam.  Follows simple commands.  Very little verbal output at would not make eye contact. Pt made eye contact when asked Leaning dramatically to left Knew was in [facility] in [place] was October 2025 and initially said in 20's, but realized was 10/30 on her own. Naming intact x3 Can repeat me No increased tone or Clonus/Hoffman's  Psychiatric:     Comments: Flat, a little grumpy but could provoke her to smile intermittently       Lab Results Last 48 Hours        Results for orders placed or performed during the hospital encounter of 05/24/24 (from the past 48 hours)  Basic metabolic panel with GFR     Status: Abnormal    Collection Time: 06/01/24  1:33 AM  Result Value Ref Range    Sodium 140 135 - 145 mmol/L    Potassium 4.4 3.5 - 5.1 mmol/L    Chloride 109 98 - 111 mmol/L    CO2 21 (L) 22 - 32 mmol/L    Glucose, Bld 93 70 - 99 mg/dL      Comment: Glucose reference range applies only to samples taken after fasting for at least 8 hours.    BUN 17 8 - 23 mg/dL    Creatinine, Ser 8.73 (H) 0.44 - 1.00 mg/dL    Calcium  8.6 (L) 8.9 - 10.3 mg/dL    GFR, Estimated 42 (L) >60 mL/min      Comment: (NOTE) Calculated using the CKD-EPI Creatinine Equation (2021)      Anion gap 10 5 - 15      Comment: Performed at [facility], [address] [place], [place] [address]  CBC  Status: None    Collection Time: 06/01/24  1:33 AM  Result Value Ref Range    WBC 6.8 4.0 - 10.5 K/uL    RBC 4.09 3.87 - 5.11 MIL/uL    Hemoglobin 12.2 12.0 - 15.0 g/dL    HCT 62.2 63.9 - 53.9 %    MCV 92.2 80.0 - 100.0 fL    MCH 29.8 26.0 - 34.0 pg    MCHC 32.4 30.0 - 36.0 g/dL    RDW 84.6 88.4 - 84.4 %    Platelets 288 150 - 400 K/uL    nRBC 0.0 0.0 - 0.2 %      Comment: Performed at [facility], [address] [place], [place] [address]      Imaging Results (Last 48 hours)  No results found.          Blood pressure (!) 148/80, pulse 69, temperature 98 F (36.7 C), resp. rate 18, weight 77.4 kg, SpO2 96%.   Medical Problem List and Plan: 1. Functional deficits secondary to right PCA large infarct with multiple small scattered infarcts in bilateral hemispheres with R P1 occlusion status post IR but unsuccessful due to tortuosity of vessel             -patient may  shower             -ELOS/Goals: 14-16 days supervision of PT, OT and SLP             Admit to CIR- appropriate 2.  Antithrombotics: -DVT/anticoagulation:  Pharmaceutical: Eliquis for A flutter             -antiplatelet therapy: N/A 3. Pain Management: Robaxin  500 mg every 8 hours as needed 4. Mood/Behavior/Sleep: Namenda  5 mg twice daily, Remeron  15 mg nightly, Xanax 0.25 mg twice daily as needed             -antipsychotic agents: Seroquel 12.5 mg nightly 5. Neuropsych/cognition: This patient is not capable of making decisions on her own behalf due to vascular dementia 6. Skin/Wound Care: Routine skin checks 7. Fluids/Electrolytes/Nutrition: Routine and analysis with follow-up chemistries 8.  Hypertension.  Norvasc 5 mg daily.  Monitor with increased mobility 9.  Hyperlipidemia.  Lipitor 10.  GERD.  Protonix  11.  Hypothyroidism.  Synthroid  100 mcg daily 12. Hx of german measles of L eye as child and macular degeneration- not clear why cannot see out of L eye- could be old vs hemianopia 13. Vascular dementia- on namenda  14. Hx of L THA in 01/2024- and prior L TKA 15. Aflutter- on Eliquis- doesn't need rate control per Cards 16. Insomnia- Con't Quetiapine         Toribio JINNY Pitch, PA-C 06/02/2024     I have personally performed a face to face diagnostic evaluation of this patient and formulated the key components of the plan.  Additionally, I have personally reviewed laboratory data, imaging studies, as well as relevant notes and concur with the physician assistant's documentation above.   The patient's status  has not changed from the original H&P.  Any changes in documentation from the acute care chart have been noted above.

## 2024-06-02 NOTE — Progress Notes (Signed)
 PMR Admission Coordinator Pre-Admission Assessment   Patient: Donna Petty is an 88 y.o., female MRN: [id number] DOB: 1936/02/21 Height:   Weight: 77.4 kg   Insurance Information HMO: yes    PPO:      PCP:      IPA:      80/20:      OTHER:  PRIMARY: [employer]      Policy#: [id number]      Subscriber:  CM Name: Ellouise      Phone#:  [phone]  Fax#: [phone] Pre-Cert#: [id number]   I received auth for CIR from [place]  with HTA for admit 06/02/24 through 11/5.     Employer:  Benefits:  Phone #[phone]-option 1, spoke with Geni Gemma Date: 08/05/2023 - 08/03/2024 Deductible: does not have one OOP Max: $3,400 ($245 met) CIR: $195/day co-pay for days 1-6, $0/day days 7-90 SNF:  $0/day co-pay for days 1-20, $214/day co-pay for days 21-100; limited to 100 days/benefit period Outpatient: $5/visit co-pay; limited by medical necessity Home Health:  100% coverage DME: 80% coverage; 20% co-insurance Providers: In network  SECONDARY:       Policy#:      Phone#:    Artist:       Phone#:    The Data Processing Manager" for patients in Inpatient Rehabilitation Facilities with attached "Privacy Act Statement-Health Care Records" was provided and verbally reviewed with: Patient   Emergency Contact Information Contact Information       Name Relation Home Work Mobile    Ratcliff,Teresa Daughter [phone]             Other Contacts       Name Relation Home Work Mobile    Frye,Edwin Relative [phone]   [phone]           Current Medical History  Patient Admitting Diagnosis: CVA History of Present Illness:  Donna Petty is a 88 year old right-handed female with history of depression, hypothyroidism, left ICA stenosis 50-70%, hyperlipidemia, memory deficits maintained on Namenda , GERD, macular degeneration.  Per chart review patient lives with her children.  1 level home 5 steps to entry.  Independent prior to admission however she will use a  rollator at times within the community.  Presented 05/24/2024 with acute onset of left-sided weakness right side gaze and left hemianopsia.  Blood pressure elevated 190s systolic in the ED.  Cranial CT scan showed stable chronic small vessel disease.  CTA showed right PCA P1 segment occlusion bilateral proximal subclavian artery and vertebral artery origin atherosclerosis without hemodynamically significant stenosis.  Right ICA origin plaque with 60% stenosis.  Bilateral cervical ICA tortuosity and possible fibromuscular dysplasia.  LICA siphon moderate supraclinoid stenosis.  Patient did receive TNK.  Underwent cerebral arteriogram attempted unsuccessful thrombectomy 05/24/2024 per Dr. Nancyann Burns.  CTA angio of the head follow-up showed early right PCA territory cytotoxic edema without hemorrhagic transformation or mass effect.  A follow-up MRI was completed showing large acute right PCA infarct with additional scattered small acute bilateral cerebral and cerebellar infarcts.  Moderate cerebral atrophy with mild chronic small vessel ischemic disease with chronic lacunar infarct.  Admission chemistries unremarkable except creatinine 1.28.  Echocardiogram ejection fraction of 60 to 65% no wall motion abnormalities.  Initially placed on aspirin  and Plavix for CVA prophylaxis.  Hospital course cardiology services consulted 10/23 for evaluation of atrial flutter and patient ultimately has been placed on Eliquis for both CVA prophylaxis and a flutter and aspirin  and Plavix discontinued.  Palliative care consulted to  establish goals of care.  Her diet has been advanced to a regular consistency.  Complete NIHSS TOTAL: 6   Patient's [facility] has been reviewed by the rehabilitation admission coordinator and physician.   Past Medical History      Past Medical History:  Diagnosis Date   Acid reflux     Arthritis     Carotid artery disease      50-70% left internal carotid  artery stenosis   Depression     Dyspnea     Hypothyroidism     Macular degeneration     Thyroid  condition     Visual changes            Has the patient had major surgery during 100 days prior to admission? No   Family History   family history includes Hypertension in her father.   Current Medications  Current Medications    Current Facility-Administered Medications:    acetaminophen  (TYLENOL ) tablet 650 mg, 650 mg, Oral, Q4H PRN, 650 mg at 05/29/24 2217 **OR** acetaminophen  (TYLENOL ) 160 MG/5ML solution 650 mg, 650 mg, Per Tube, Q4H PRN **OR** acetaminophen  (TYLENOL ) suppository 650 mg, 650 mg, Rectal, Q4H PRN, Khaliqdina, Salman, MD   ALPRAZolam (XANAX) tablet 0.25 mg, 0.25 mg, Oral, BID PRN, Toberman, Stevi W, NP, 0.25 mg at 05/29/24 2217   amLODipine (NORVASC) tablet 5 mg, 5 mg, Oral, Daily, Rosemarie, Pramod S, MD, 5 mg at 05/30/24 0902   apixaban (ELIQUIS) tablet 5 mg, 5 mg, Oral, BID, Billy Rocky SAUNDERS, RPH, 5 mg at 05/30/24 0901   atorvastatin (LIPITOR) tablet 20 mg, 20 mg, Oral, Daily, Jerri Pfeiffer, MD, 20 mg at 05/30/24 0902   Chlorhexidine  Gluconate Cloth 2 % PADS 6 each, 6 each, Topical, Daily, Khaliqdina, Salman, MD, 6 each at 05/30/24 0902   labetalol (NORMODYNE) injection 20 mg, 20 mg, Intravenous, Q2H PRN, Sethi, Pramod S, MD, 20 mg at 05/25/24 1256   levothyroxine  (SYNTHROID ) tablet 100 mcg, 100 mcg, Oral, Q0600, Sethi, Pramod S, MD, 100 mcg at 05/30/24 0537   memantine  (NAMENDA ) tablet 5 mg, 5 mg, Oral, BID, Sethi, Pramod S, MD, 5 mg at 05/30/24 9097   methocarbamol  (ROBAXIN ) tablet 500 mg, 500 mg, Oral, Q8H PRN, Sethi, Pramod S, MD, 500 mg at 05/29/24 2217   mirtazapine  (REMERON ) tablet 15 mg, 15 mg, Oral, QHS, Sethi, Pramod S, MD, 15 mg at 05/29/24 2216   pantoprazole  (PROTONIX ) EC tablet 40 mg, 40 mg, Oral, QHS, Sethi, Pramod S, MD, 40 mg at 05/29/24 2217   polyethylene glycol (MIRALAX  / GLYCOLAX ) packet 17 g, 17 g, Oral, BID, Rosemarie, Pramod S, MD, 17 g at 05/30/24  0902   QUEtiapine (SEROQUEL) tablet 12.5 mg, 12.5 mg, Oral, BID, Shafer, Devon, NP, 12.5 mg at 05/30/24 0902   senna-docusate (Senokot-S) tablet 1 tablet, 1 tablet, Oral, QHS PRN, Khaliqdina, Salman, MD, 1 tablet at 05/25/24 1300     Patients Current Diet:  Diet Order                  Diet regular Fluid consistency: Thin  Diet effective now                         Precautions / Restrictions Precautions Precautions: Fall Precaution/Restrictions Comments: L neglect, SBP <180 Restrictions Weight Bearing Restrictions Per Provider Order: No    Has the patient had 2 or more falls or a fall with injury in the past year? No  Prior Activity Level  Pt. Active in the community PTA   Prior Functional Level Self Care: Did the patient need help bathing, dressing, using the toilet or eating? Independent   Indoor Mobility: Did the patient need assistance with walking from room to room (with or without device)? Independent   Stairs: Did the patient need assistance with internal or external stairs (with or without device)? Independent   Functional Cognition: Did the patient need help planning regular tasks such as shopping or remembering to take medications? Independent   Patient Information Are you of Hispanic, Latino/a,or Spanish origin?: A. No, not of Hispanic, Latino/a, or Spanish origin What is your race?: A. White Do you need or want an interpreter to communicate with a doctor or health care staff?: 0. No Patient information obtained via proxy : yes   Patient's Response To:  Health Literacy and Transportation Is the patient able to respond to health literacy and transportation needs?: No Health Literacy - How often do you need to have someone help you when you read instructions, pamphlets, or other written material from your doctor or pharmacy?: Patient unable to respond In the past 12 months, has lack of transportation kept you from medical appointments or from getting  medications?: No In the past 12 months, has lack of transportation kept you from meetings, work, or from getting things needed for daily living?: No Higher Education Careers Adviser obtained via proxy: yes   Journalist, Newspaper / Equipment Home Equipment: Agricultural Consultant (2 wheels), Rollator (4 wheels), [employer] - single point   Prior Device Use: Indicate devices/aids used by the patient prior to current illness, exacerbation or injury? None of the above   Current Functional Level Cognition   Arousal/Alertness: Awake/alert Overall Cognitive Status: Within Functional Limits for tasks assessed Orientation Level: Oriented to person, Oriented to place, Oriented to time, Oriented to situation Attention: Sustained Sustained Attention: Appears intact Awareness: Impaired Awareness Impairment: Intellectual impairment, Emergent impairment, Anticipatory impairment Safety/Judgment: Impaired    Extremity Assessment (includes Sensation/Coordination)   Upper Extremity Assessment: Right hand dominant, LUE deficits/detail LUE Deficits / Details: grasp 5 out 5, decreased cooridnation, pt with decreased shoulder flexion to 80 degrees LUE Coordination: decreased gross motor, decreased fine motor  Lower Extremity Assessment: Defer to PT evaluation     ADLs   Overall ADL's : Needs assistance/impaired Eating/Feeding: Set up, Sitting Eating/Feeding Details (indicate cue type and reason): assistance with opening containers Grooming: Wash/dry hands, Wash/dry face, Sitting, Supervision/safety, Cueing for sequencing Upper Body Bathing: Maximal assistance, Bed level Lower Body Bathing: Total assistance Upper Body Dressing : Maximal assistance Lower Body Dressing: Total assistance Toilet Transfer: Maximal assistance, +2 for physical assistance, +2 for safety/equipment (sit <>stand)     Mobility   Overal bed mobility: Needs Assistance Bed Mobility: Supine to Sit Rolling: Max assist, +2 for physical  assistance Sidelying to sit: Max assist, +2 for physical assistance Supine to sit: Mod assist, HOB elevated Sit to supine: Max assist, +2 for physical assistance General bed mobility comments: verbal cues and mod assist due to assistance needed with trunk     Transfers   Overall transfer level: Needs assistance Equipment used: Rolling walker (2 wheels) Transfers: Sit to/from Stand Sit to Stand: Min assist Bed to/from chair/wheelchair/BSC transfer type:: Via Financial Planner via Lift Equipment: [employer] transfer comment: pt benefits from cues for hand placement and to increase anterior weight shift     Ambulation / Gait / Stairs / Wheelchair Mobility   Ambulation/Gait Ambulation/Gait assistance:  Min assist Gait Distance (Feet): 5 Feet (5' x 2 trials) Assistive device: Rolling walker (2 wheels) Gait Pattern/deviations: Step-to pattern, Shuffle, Trunk flexed General Gait Details: pt with slowed shuffling steps, trunk flexed over RW which she reports is baseline due to chronic back pain Gait velocity: reduced Gait velocity interpretation: <1.31 ft/sec, indicative of household ambulator     Posture / Balance Dynamic Sitting Balance Sitting balance - Comments: CGA on EOB Balance Overall balance assessment: Needs assistance Sitting-balance support: No upper extremity supported, Feet supported Sitting balance-Leahy Scale: Fair Sitting balance - Comments: CGA on EOB Standing balance support: Bilateral upper extremity supported, Reliant on assistive device for balance Standing balance-Leahy Scale: Poor Standing balance comment: reliant on Stedy for support     Special considerations/life events  Skin intact  and Special service needs none    Previous Home Environment (from acute therapy documentation) Living Arrangements: Children Available Help at Discharge: Family Type of Home: House Home Layout: One level Home Access: Stairs to enter Entrance Stairs-Rails: Right,  Left, Can reach both Entrance Stairs-Number of Steps: 6 Bathroom Shower/Tub: Engineer, Manufacturing Systems: Handicapped height Bathroom Accessibility: Yes How Accessible: Accessible via walker Additional Comments: has 2 cats Bae and simon   Discharge Living Setting Plans for Discharge Living Setting: House Type of Home at Discharge: House Discharge Home Layout: One level Discharge Home Access: Stairs to enter Entrance Stairs-Rails: Right, Left, Can reach both Entrance Stairs-Number of Steps: 6 Discharge Bathroom Shower/Tub: Tub/shower unit Discharge Bathroom Toilet: Standard Discharge Bathroom Accessibility: Yes How Accessible: Accessible via walker   Social/Family/Support Systems Patient Roles: Other (Comment) Contact Information: [phone] Anticipated Caregiver: [name] Ability/Limitations of Caregiver: Min A Caregiver Availability: 24/7 Discharge Plan Discussed with Primary Caregiver: Yes Is Caregiver In Agreement with Plan?: Yes Does Caregiver/Family have Issues with Lodging/Transportation while Pt is in Rehab?: No   Goals Patient/Family Goal for Rehab: PT/OT/SLP Supervision Expected length of stay: 12-14 days Pt/Family Agrees to Admission and willing to participate: Yes Program Orientation Provided & Reviewed with Pt/Caregiver Including Roles  & Responsibilities: Yes   Decrease burden of Care through IP rehab admission: not anticipated   Possible need for SNF placement upon discharge: not anticipated   Patient Condition: I have reviewed medical records from [facility], spoken with CM, and patient and daughter. I met with patient at the bedside for inpatient rehabilitation assessment.  Patient will benefit from ongoing PT, OT, and SLP, can actively participate in 3 hours of therapy a day 5 days of the week, and can make measurable gains during the admission.  Patient will also benefit from the coordinated team approach during an Inpatient Acute  Rehabilitation admission.  The patient will receive intensive therapy as well as Rehabilitation physician, nursing, social worker, and care management interventions.  Due to bladder management, bowel management, safety, skin/wound care, disease management, medication administration, pain management, and patient education the patient requires 24 hour a day rehabilitation nursing.  The patient is currently mod A  with mobility and basic ADLs.  Discharge setting and therapy post discharge at home with home health is anticipated.  Patient has agreed to participate in the Acute Inpatient Rehabilitation Program and will admit today.   Preadmission Screen Completed By:  Leita KATHEE Kleine, 05/30/2024 12:36 PM ______________________________________________________________________   Discussed status with Dr. Cornelio on 06/02/24 at 951 and received approval for admission today.   Admission Coordinator:  Leita KATHEE Kleine, CCC-SLP, time 951/Date 06/02/24    Assessment/Plan: Diagnosis: PCA stroke Does the need  for close, 24 hr/day Medical supervision in concert with the patient's rehab needs make it unreasonable for this patient to be served in a less intensive setting? Yes Co-Morbidities requiring supervision/potential complications: Macular degeneration, ICA stenosis; HTN, L hemiparesis; depression, dementia on Namenda ; L hemianopsia;  Due to bladder management, bowel management, safety, skin/wound care, disease management, medication administration, and patient education, does the patient require 24 hr/day rehab nursing? Yes Does the patient require coordinated care of a physician, rehab nurse, PT, OT, and SLP to address physical and functional deficits in the context of the above medical diagnosis(es)? Yes Addressing deficits in the following areas: balance, endurance, locomotion, strength, transferring, bowel/bladder control, bathing, dressing, feeding, grooming, toileting, cognition, speech, and language Can the  patient actively participate in an intensive therapy program of at least 3 hrs of therapy 5 days a week? Yes The potential for patient to make measurable gains while on inpatient rehab is good Anticipated functional outcomes upon discharge from inpatient rehab: supervision PT, supervision OT, supervision SLP Estimated rehab length of stay to reach the above functional goals is: 12-14 days Anticipated discharge destination: Home 10. Overall Rehab/Functional Prognosis: good     MD Signature:

## 2024-06-02 NOTE — TOC Transition Note (Signed)
 Transition of Care [facility]) - Discharge Note   Patient Details  Name: Donna Petty MRN: [id number] Date of Birth: 10-17-1935  [facility]) CM/SW Contact:  Andrez JULIANNA George, RN Phone Number: 06/02/2024, 9:53 AM   Clinical Narrative:     Pt is discharging to CIR today. CM signing off.  Final next level of care: IP Rehab Facility Barriers to Discharge: No Barriers Identified   Patient Goals and CMS Choice   CMS Medicare.gov Compare Post Acute Care list provided to:: Patient Choice offered to / list presented to : Patient      Discharge Placement                       Discharge Plan and Services Additional resources added to the After Visit Summary for                                       Social Drivers of Health (SDOH) Interventions SDOH Screenings   Food Insecurity: No Food Insecurity (05/25/2024)  Housing: Low Risk  (05/25/2024)  Transportation Needs: No Transportation Needs (05/25/2024)  Utilities: Not At Risk (05/25/2024)  Social Connections: Moderately Integrated (05/25/2024)  Tobacco Use: Low Risk  (01/26/2024)     Readmission Risk Interventions     No data to display

## 2024-06-03 DIAGNOSIS — N1831 Chronic kidney disease, stage 3a: Secondary | ICD-10-CM | POA: Diagnosis not present

## 2024-06-03 DIAGNOSIS — I10 Essential (primary) hypertension: Secondary | ICD-10-CM

## 2024-06-03 DIAGNOSIS — I4892 Unspecified atrial flutter: Secondary | ICD-10-CM

## 2024-06-03 DIAGNOSIS — I63531 Cerebral infarction due to unspecified occlusion or stenosis of right posterior cerebral artery: Secondary | ICD-10-CM | POA: Diagnosis not present

## 2024-06-03 NOTE — Plan of Care (Signed)
  Problem: RH Balance Goal: LTG: Patient will maintain dynamic sitting balance (OT) Description: LTG:  Patient will maintain dynamic sitting balance with assistance during activities of daily living (OT) Flowsheets (Taken 06/03/2024 1119) LTG: Pt will maintain dynamic sitting balance during ADLs with: Independent with assistive device Goal: LTG Patient will maintain dynamic standing with ADLs (OT) Description: LTG:  Patient will maintain dynamic standing balance with assist during activities of daily living (OT)  Flowsheets (Taken 06/03/2024 1119) LTG: Pt will maintain dynamic standing balance during ADLs with: Supervision/Verbal cueing   Problem: Sit to Stand Goal: LTG:  Patient will perform sit to stand in prep for activites of daily living with assistance level (OT) Description: LTG:  Patient will perform sit to stand in prep for activites of daily living with assistance level (OT) Flowsheets (Taken 06/03/2024 1119) LTG: PT will perform sit to stand in prep for activites of daily living with assistance level: Supervision/Verbal cueing   Problem: RH Eating Goal: LTG Patient will perform eating w/assist, cues/equip (OT) Description: LTG: Patient will perform eating with assist, with/without cues using equipment (OT) Flowsheets (Taken 06/03/2024 1119) LTG: Pt will perform eating with assistance level of: Independent with assistive device    Problem: RH Grooming Goal: LTG Patient will perform grooming w/assist,cues/equip (OT) Description: LTG: Patient will perform grooming with assist, with/without cues using equipment (OT) Flowsheets (Taken 06/03/2024 1119) LTG: Pt will perform grooming with assistance level of: Independent with assistive device    Problem: RH Bathing Goal: LTG Patient will bathe all body parts with assist levels (OT) Description: LTG: Patient will bathe all body parts with assist levels (OT) Flowsheets (Taken 06/03/2024 1119) LTG: Pt will perform bathing with  assistance level/cueing: Supervision/Verbal cueing   Problem: RH Dressing Goal: LTG Patient will perform upper body dressing (OT) Description: LTG Patient will perform upper body dressing with assist, with/without cues (OT). Flowsheets (Taken 06/03/2024 1119) LTG: Pt will perform upper body dressing with assistance level of: Independent with assistive device Goal: LTG Patient will perform lower body dressing w/assist (OT) Description: LTG: Patient will perform lower body dressing with assist, with/without cues in positioning using equipment (OT) Flowsheets (Taken 06/03/2024 1119) LTG: Pt will perform lower body dressing with assistance level of: Supervision/Verbal cueing   Problem: RH Toileting Goal: LTG Patient will perform toileting task (3/3 steps) with assistance level (OT) Description: LTG: Patient will perform toileting task (3/3 steps) with assistance level (OT)  Flowsheets (Taken 06/03/2024 1119) LTG: Pt will perform toileting task (3/3 steps) with assistance level: Contact Guard/Touching assist   Problem: RH Vision Goal: RH LTG Vision Consulting Civil Engineer) Flowsheets (Taken 06/03/2024 1119) LTG: Vision Goals: patient will demonstrate increased ability to recognize visual stimulus utilizing visual cues/ stimulus during 3/4 trials /   Problem: RH Toilet Transfers Goal: LTG Patient will perform toilet transfers w/assist (OT) Description: LTG: Patient will perform toilet transfers with assist, with/without cues using equipment (OT) Flowsheets (Taken 06/03/2024 1119) LTG: Pt will perform toilet transfers with assistance level of: Supervision/Verbal cueing

## 2024-06-03 NOTE — Progress Notes (Signed)
 Patient ID: Donna Petty, female   DOB: [birth year], 88 y.o.   MRN: [id number] Met with the patient to review current medical situation, rehab process, team conference and plan of care. Discussed secondary risk management including HTN, HLD (LDL 95/Trig 167) with medication (has a pill box) and dietary modification (dtr fixes meals).  Discussed new onset Atrial Flutter with addition of Eliquis. Patient reports mac. Deg with floaters but no changes in vision and little weakness post CVA. Continue to follow along to address educational needs to facilitate preparation for discharge. [name]

## 2024-06-03 NOTE — Plan of Care (Signed)
  Problem: Consults Goal: RH STROKE PATIENT EDUCATION Description: See Patient Education module for education specifics  Outcome: Progressing Goal: Nutrition Consult-if indicated Outcome: Progressing   Problem: RH BOWEL ELIMINATION Goal: RH STG MANAGE BOWEL WITH ASSISTANCE Description: STG Manage Bowel with mod I Assistance. Outcome: Progressing Goal: RH STG MANAGE BOWEL W/MEDICATION W/ASSISTANCE Description: STG Manage Bowel with Medication with mod I Assistance. Outcome: Progressing   Problem: RH BLADDER ELIMINATION Goal: RH STG MANAGE BLADDER WITH ASSISTANCE Description: STG Manage Bladder With toileting Assistance Outcome: Progressing   Problem: RH SAFETY Goal: RH STG ADHERE TO SAFETY PRECAUTIONS W/ASSISTANCE/DEVICE Description: STG Adhere to Safety Precautions With cues Assistance/Device. Outcome: Progressing   Problem: RH PAIN MANAGEMENT Goal: RH STG PAIN MANAGED AT OR BELOW PT'S PAIN GOAL Description: Pain < 4 with prns Outcome: Progressing   Problem: RH KNOWLEDGE DEFICIT Goal: RH STG INCREASE KNOWLEDGE OF HYPERTENSION Description: Patient and dtr will be able to manage HTN using educational resources for medications and dietary modification independently Outcome: Progressing Goal: RH STG INCREASE KNOWLEGDE OF HYPERLIPIDEMIA Description: Patient and dtr will be able to manage HLD using educational resources for medications and dietary modification independently Outcome: Progressing Goal: RH STG INCREASE KNOWLEDGE OF STROKE PROPHYLAXIS Description: Patient and dtr will be able to manage secondary risks using educational resources for medications and dietary modification independently Outcome: Progressing

## 2024-06-03 NOTE — Progress Notes (Signed)
 PROGRESS NOTE   Subjective/Complaints: Working with therapy this AM. No new complaints this AM.  ROS: Patient denies fever, new vision changes, dizziness, nausea, vomiting, diarrhea,  shortness of breath or chest pain, headache, or mood change.   Objective:   No results found. Recent Labs    06/01/24 0133  WBC 6.8  HGB 12.2  HCT 37.7  PLT 288   Recent Labs    06/01/24 0133  NA 140  K 4.4  CL 109  CO2 21*  GLUCOSE 93  BUN 17  CREATININE 1.26*  CALCIUM  8.6*    Intake/Output Summary (Last 24 hours) at 06/03/2024 1230 Last data filed at 06/02/2024 1800 Gross per 24 hour  Intake 118 ml  Output --  Net 118 ml        Physical Exam: Vital Signs Blood pressure (!) 145/66, pulse 67, temperature 98 F (36.7 C), temperature source Oral, resp. rate 17, height 5' 1 (1.549 m), weight 74.3 kg, SpO2 95%.  General: No apparent distress, sitting in WC, obese HEENT: Head is normocephalic, atraumatic, sclera anicteric, oral mucosa pink and moist Heart: Reg rate Rhythm irregular. . No murmurs rubs or gallops Chest: CTA bilaterally, non-labored Abdomen: Soft, non-tender, non-distended, bowel sounds positive. Extremities: No clubbing, cyanosis, or edema. Pulses are 2+ Psych: Flat  Skin: Many bruises throughout esp LUE distally and R inner thigh  Neuro:  alert and oriented x3, Right gaze preference but can look left.  L hemianopia  -had german measles as child affecting l eye and macular degeneration . Minimal verbal output. Follows simple commands.  MOTOR: RUE and RLE 5/5, other than Ankle DF 4/5- may be effort limited LUE/LLE- 5-/5 throughout  other than Ankle DF 4/5- may be effort limited  No hypertonia noted    Assessment/Plan: 1. Functional deficits which require 3+ hours per day of interdisciplinary therapy in a comprehensive inpatient rehab setting. Physiatrist is providing close team supervision and 24 hour  management of active medical problems listed below. Physiatrist and rehab team continue to assess barriers to discharge/monitor patient progress toward functional and medical goals  Care Tool:  Bathing    Body parts bathed by patient: Right arm, Left arm, Chest, Front perineal area, Abdomen, Right upper leg, Left upper leg, Face   Body parts bathed by helper: Buttocks, Right lower leg, Left lower leg     Bathing assist Assist Level: Maximal Assistance - Patient 24 - 49%     Upper Body Dressing/Undressing Upper body dressing   What is the patient wearing?: Pull over shirt    Upper body assist Assist Level: Contact Guard/Touching assist    Lower Body Dressing/Undressing Lower body dressing      What is the patient wearing?: Underwear/pull up, Pants     Lower body assist Assist for lower body dressing: Maximal Assistance - Patient 25 - 49%     Toileting Toileting    Toileting assist Assist for toileting: Maximal Assistance - Patient 25 - 49%     Transfers Chair/bed transfer  Transfers assist     Chair/bed transfer assist level: Moderate Assistance - Patient 50 - 74%     Locomotion Ambulation   Ambulation assist  Assist level: 2 helpers Assistive device: No Device Max distance: 19   Walk 10 feet activity   Assist     Assist level: 2 helpers Assistive device: No Device   Walk 50 feet activity   Assist Walk 50 feet with 2 turns activity did not occur: Safety/medical concerns (weakness/fatigue/pain)         Walk 150 feet activity   Assist Walk 150 feet activity did not occur: Safety/medical concerns (weakness/fatigue/pain)         Walk 10 feet on uneven surface  activity   Assist Walk 10 feet on uneven surfaces activity did not occur: Safety/medical concerns (weakness/fatigue/pain)         Wheelchair     Assist               Wheelchair 50 feet with 2 turns activity    Assist            Wheelchair 150  feet activity     Assist          Blood pressure (!) 145/66, pulse 67, temperature 98 F (36.7 C), temperature source Oral, resp. rate 17, height 5' 1 (1.549 m), weight 74.3 kg, SpO2 95%.  Medical Problem List and Plan: 1. 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  Antithrombotics: -DVT/anticoagulation:  Pharmaceutical: Eliquis for A flutter             -antiplatelet therapy: N/A 3. Pain Management: Robaxin  500 mg every 8 hours as needed 4. Mood/Behavior/Sleep: Namenda  5 mg twice daily, Remeron  15 mg nightly, Xanax 0.25 mg twice daily as needed             -antipsychotic agents: Seroquel 12.5 mg nightly 5. Neuropsych/cognition: This patient is not capable of making decisions on her own behalf due to vascular dementia 6. Skin/Wound Care: Routine skin checks 7. Fluids/Electrolytes/Nutrition: Routine and analysis with follow-up chemistries 8.  Hypertension.  Norvasc 5 mg daily.  Monitor with increased mobility    06/03/2024    7:40 AM 06/03/2024    4:47 AM 06/02/2024    8:00 PM  Vitals with BMI  Systolic 145 158 841  Diastolic 66 61 70  Pulse 67 70 92  -10/31 fair control, continue to monitor   9.  Hyperlipidemia.  Lipitor 10.  GERD.  Protonix  11.  Hypothyroidism.  Synthroid  100 mcg daily 12. Hx of german measles of L eye as child and macular degeneration- not clear why cannot see out of L eye- could be old vs hemianopia 13. Vascular dementia- on namenda  14. Hx of L THA in 01/2024- and prior L TKA 15. Aflutter- on Eliquis- doesn't need rate control per Cards  -10/31 Hr stable  16. Insomnia- Con't Quetiapine 17. CKD, appears about CKD 3a at baseline  -monitor BMP      LOS: 1 days A  FACE TO FACE EVALUATION WAS PERFORMED  Murray Collier 06/03/2024, 12:30 PM

## 2024-06-03 NOTE — Evaluation (Signed)
 Speech Language Pathology Assessment and Plan  Patient Details  Name: Donna Petty MRN: [id number] Date of Birth: 08/01/1936  SLP Diagnosis: Cognitive Impairments  Rehab Potential: Good ELOS: 12-14 days    Today's Date: 06/03/2024 SLP Individual Time: 1255-1315 SLP Individual Time Calculation (min): 20 min   Hospital Problem: Principal Problem:   Acute ischemic right posterior cerebral artery (PCA) stroke (HCC)  Past Medical History:  Past Medical History:  Diagnosis Date   Acid reflux    Arthritis    Carotid artery disease    50-70% left internal carotid artery stenosis   Depression    Dyspnea    Hypothyroidism    Macular degeneration    Thyroid  condition    Visual changes    Past Surgical History:  Past Surgical History:  Procedure Laterality Date   CATARACT EXTRACTION, BILATERAL     CHOLECYSTECTOMY     COLONOSCOPY     ESOPHAGOGASTRODUODENOSCOPY ENDOSCOPY     IR ANGIO VERTEBRAL SEL SUBCLAVIAN INNOMINATE UNI R MOD SED  05/24/2024   IR PERCUTANEOUS ART THROMBECTOMY/INFUSION INTRACRANIAL INC DIAG ANGIO  05/24/2024   IR US  GUIDE VASC ACCESS RIGHT  05/24/2024   RADIOLOGY WITH ANESTHESIA N/A 05/24/2024   Procedure: RADIOLOGY WITH ANESTHESIA;  Surgeon: Radiologist, Medication, MD;  Location: [facility] OR;  Service: Radiology;  Laterality: N/A;   TOTAL HIP ARTHROPLASTY Left 01/26/2024   Procedure: ARTHROPLASTY, HIP, TOTAL, ANTERIOR APPROACH;  Surgeon: Ernie Cough, MD;  Location: [facility];  Service: Orthopedics;  Laterality: Left;   TOTAL KNEE ARTHROPLASTY Left 06/26/2020   Procedure: TOTAL KNEE ARTHROPLASTY;  Surgeon: Ernie Cough, MD;  Location: [facility];  Service: Orthopedics;  Laterality: Left;  70 mins    Assessment / Plan / Recommendation Clinical Impression HPI: Donna Petty is a 88 year old right-handed female with history of depression, hypothyroidism, left ICA stenosis 50-70%, hyperlipidemia, memory deficits maintained on Namenda , GERD, macular degeneration. Per chart  review patient lives with her children. 1 level home 5 steps to entry. Independent prior to admission however she will use a rollator at times within the community. Presented 05/24/2024 with acute onset of left-sided weakness right side gaze and left hemianopsia. Blood pressure elevated 190s systolic in the ED. Cranial CT scan showed stable chronic small vessel disease. CTA showed right PCA P1 segment occlusion bilateral proximal subclavian artery and vertebral artery origin atherosclerosis without hemodynamically significant stenosis. Right ICA origin plaque with 60% stenosis. Bilateral cervical ICA tortuosity and possible fibromuscular dysplasia. LICA siphon moderate supraclinoid stenosis. Patient did receive TNK. Underwent cerebral arteriogram attempted unsuccessful thrombectomy 05/24/2024 per Dr. Nancyann Burns. CTA angio of the head follow-up showed early right PCA territory cytotoxic edema without hemorrhagic transformation or mass effect. A follow-up MRI was completed showing large acute right PCA infarct with additional scattered small acute bilateral cerebral and cerebellar infarcts. Moderate cerebral atrophy with mild chronic small vessel ischemic disease with chronic lacunar infarct. Admission chemistries unremarkable except creatinine 1.28. Echocardiogram ejection fraction of 60 to 65% no wall motion abnormalities. Initially placed on aspirin  and Plavix for CVA prophylaxis. Hospital course cardiology services consulted 10/23 for evaluation of atrial flutter and patient ultimately has been placed on Eliquis for both CVA prophylaxis and a flutter and aspirin  and Plavix discontinued. Palliative care consulted to establish goals of care. Her diet has been advanced to a regular consistency.   Clinical Impression:  Patient was evaluated via informal means to assess cognitive linguistic skills. Patient with seemingly intact expressive/receptive language and is independently oriented to person, place, situation  and time. Patient with deficits in intellectual awareness as patient states she feels as if she is at baseline and could go home and independently move around the home. Informal deficits observed in LTM as patient with difficulty recalling job hx and highschool name. Patient did correctly recall living with daughter, Donna Petty. Patient refused further evaluation stating I just want to die and with increasing frustrating yelling just leave me alone. SLP provided encouragement as able and reviewed purpose of inpatient rehabiliation. Patient refused completion of oral mech exam and PO intake. Patient is 100% intelligibile at the conversational level. SLP attempted to contact daughter per phone number located in chart, however went to voicemail.  Patient would benefit from further cognitive linguistic evaluation as patient is willing to participate. Missed 40 minutes of skilled ST due to patient refusal. Pt would benefit from skilled ST services to maximize cognition in order to maximize functional independence at d/c. Anticipate patient will require supervision at d/c and f/u TBD SLP services.    Skilled Therapeutic Interventions          Patient evaluated using a non-standardized cognitive linguistic assessment and bedside swallow evaluation to assess current cognitive, communicative and swallowing function. See above for details.    SLP Assessment  Patient will need skilled Speech Lanaguage Pathology Services during CIR admission    Recommendations  SLP Diet Recommendations: Age appropriate regular solids;Thin Medication Administration: Whole meds with liquid Supervision: Patient able to self feed;Staff to assist with self feeding Compensations: Slow rate;Small sips/bites Postural Changes and/or Swallow Maneuvers: Seated upright 90 degrees;Upright 30-60 min after meal Oral Care Recommendations: Oral care BID Patient destination: Home Follow up Recommendations:  (TBD) Equipment Recommended: None  recommended by SLP    SLP Frequency 3 to 5 out of 7 days   SLP Duration  SLP Intensity  SLP Treatment/Interventions 12-14 days  Minumum of 1-2 x/day, 30 to 90 minutes  Cognitive remediation/compensation;Internal/external aids;Cueing hierarchy;Functional tasks;Patient/family education;Therapeutic Activities    Pain None reported   SLP Evaluation Cognition Overall Cognitive Status: Difficult to assess (due to refusal) Arousal/Alertness: Awake/alert Orientation Level: Oriented X4 Year: 2025 Month: October Day of Week: Correct Attention: Sustained Sustained Attention: Appears intact Memory: Impaired Memory Impairment: Decreased long term memory Decreased Long Term Memory: Verbal basic Awareness: Impaired Awareness Impairment: Intellectual impairment Problem Solving: Impaired Safety/Judgment: Impaired  Comprehension Auditory Comprehension Overall Auditory Comprehension: Appears within functional limits for tasks assessed Expression Expression Primary Mode of Expression: Verbal Verbal Expression Overall Verbal Expression: Appears within functional limits for tasks assessed Oral Motor Oral Motor/Sensory Function Overall Oral Motor/Sensory Function: Within functional limits Motor Speech Overall Motor Speech: Appears within functional limits for tasks assessed  Care Tool Care Tool Cognition Ability to hear (with hearing aid or hearing appliances if normally used Ability to hear (with hearing aid or hearing appliances if normally used): 0. Adequate - no difficulty in normal conservation, social interaction, listening to TV   Expression of Ideas and Wants Expression of Ideas and Wants: 4. Without difficulty (complex and basic) - expresses complex messages without difficulty and with speech that is clear and easy to understand   Understanding Verbal and Non-Verbal Content Understanding Verbal and Non-Verbal Content: 4. Understands (complex and basic) - clear comprehension  without cues or repetitions  Memory/Recall Ability Memory/Recall Ability : Location of own room;Current season;Staff names and faces;That he or she is in a hospital/hospital unit   Short Term Goals: Week 1: SLP Short Term Goal 1 (Week 1): Patient will participate in further cognitive  linguistic evaluation SLP Short Term Goal 2 (Week 1): Patient will complete bedside swallow evaluation SLP Short Term Goal 3 (Week 1): Patient will recall cognitive and physical changes since admission given mod multimodal A SLP Short Term Goal 4 (Week 1): Patient will recall biographical information given mod multimodal A  Refer to Care Plan for Long Term Goals  Recommendations for other services: Neuropsych  Discharge Criteria: Patient will be discharged from SLP if patient refuses treatment 3 consecutive times without medical reason, if treatment goals not met, if there is a change in medical status, if patient makes no progress towards goals or if patient is discharged from hospital.  The above assessment, treatment plan, treatment alternatives and goals were discussed and mutually agreed upon: by patient  Jamariya Davidoff M.A., CCC-SLP 06/03/2024, 1:33 PM

## 2024-06-03 NOTE — Progress Notes (Signed)
 Inpatient Rehabilitation Admission Medication Review by a Pharmacist  A complete drug regimen review was completed for this patient to identify any potential clinically significant medication issues.  High Risk Drug Classes Is patient taking? Indication by Medication  Antipsychotic Yes Quetiapine- mood   Anticoagulant Yes Apixaban- CVA ppx   Antibiotic No   Opioid No   Antiplatelet No   Hypoglycemics/insulin No   Vasoactive Medication Yes Amlodipine - blood pressure  Chemotherapy No   Other Yes PEG , Senokot-S , and - constipation Acetaminophen - pain  Robaxin - muscle spasms   Lipitor- HLD  Memantine - dementia Pantoprazole - reflux  Mirtazipine- mood  Synthroid - hypothyroidism      Type of Medication Issue Identified Description of Issue Recommendation(s)  Drug Interaction(s) (clinically significant)     Duplicate Therapy     Allergy     No Medication Administration End Date     Incorrect Dose     Additional Drug Therapy Needed     Significant med changes from prior encounter (inform family/care partners about these prior to discharge). PTA meds not resumed inpt:  advil, cetirizine   PTA meds discontinued: Lasix , Zetia  Communicate relevant medication changes to patient/family members at discharge from CIR.   Restart or discontinue PTA meds not resumed in CIR at discharge if clinically indicated.   Other       Clinically significant medication issues were identified that warrant physician communication and completion of prescribed/recommended actions by midnight of the next day:  No  Name of provider notified for urgent issues identified:   Provider Method of Notification:     Pharmacist comments: Patient will need apixaban education prior to d/c from CIR.    Time spent performing this drug regimen review (minutes):  20   Gregorio Cara, PharmD Clinical Pharmacist 06/03/2024 7:28 AM

## 2024-06-03 NOTE — Progress Notes (Signed)
 Inpatient Rehabilitation Center Individual Statement of Services  Patient Name:  Donna Petty  Date:  06/03/2024  Welcome to the Inpatient Rehabilitation Center.  Our goal is to provide you with an individualized program based on your diagnosis and situation, designed to meet your specific needs.  With this comprehensive rehabilitation program, you will be expected to participate in at least 3 hours of rehabilitation therapies Monday-Friday, with modified therapy programming on the weekends.  Your rehabilitation program will include the following services:  Physical Therapy (PT), Occupational Therapy (OT), Speech Therapy (ST), 24 hour per day rehabilitation nursing, Therapeutic Recreaction (TR), Care Coordinator, Rehabilitation Medicine, Nutrition Services, and Pharmacy Services  Weekly team conferences will be held on Wednesday to discuss your progress.  Your Inpatient Rehabilitation Care Coordinator will talk with you frequently to get your input and to update you on team discussions.  Team conferences with you and your family in attendance may also be held.  Expected length of stay: 10-12 days  Overall anticipated outcome: supervision with cues  Depending on your progress and recovery, your program may change. Your Inpatient Rehabilitation Care Coordinator will coordinate services and will keep you informed of any changes. Your Inpatient Rehabilitation Care Coordinator's name and contact numbers are listed  below.  The following services may also be recommended but are not provided by the Inpatient Rehabilitation Center:   Home Health Rehabiltiation Services Outpatient Rehabilitation Services    Arrangements will be made to provide these services after discharge if needed.  Arrangements include referral to agencies that provide these services.  Your insurance has been verified to be:  [employer] Your primary doctor is:  Izetta Cork  Pertinent information will be shared with  your doctor and your insurance company.  Inpatient Rehabilitation Care Coordinator:  [name] [name] SIERRAS [phone] or ELIGAH BRINKS  Information discussed with and copy given to patient by: Raymonde Asberry MATSU, 06/03/2024, 10:10 AM

## 2024-06-03 NOTE — Plan of Care (Signed)
  Problem: RH Problem Solving Goal: LTG Patient will demonstrate problem solving for (SLP) Description: LTG:  Patient will demonstrate problem solving for basic/complex daily situations with cues  (SLP) Flowsheets (Taken 06/03/2024 1337) LTG: Patient will demonstrate problem solving for (SLP): Basic daily situations LTG Patient will demonstrate problem solving for: Minimal Assistance - Patient > 75%   Problem: RH Memory Goal: LTG Patient will use memory compensatory aids to (SLP) Description: LTG:  Patient will use memory compensatory aids to recall biographical/new, daily complex information with cues (SLP) Flowsheets (Taken 06/03/2024 1337) LTG: Patient will use memory compensatory aids to (SLP): Minimal Assistance - Patient > 75%   Problem: RH Attention Goal: LTG Patient will demonstrate this level of attention during functional activites (SLP) Description: LTG:  Patient will will demonstrate this level of attention during functional activites (SLP) Flowsheets (Taken 06/03/2024 1337) Patient will demonstrate during cognitive/linguistic activities the attention type of: Sustained LTG: Patient will demonstrate this level of attention during cognitive/linguistic activities with assistance of (SLP): Minimal Assistance - Patient > 75%   Problem: RH Awareness Goal: LTG: Patient will demonstrate awareness during functional activites type of (SLP) Description: LTG: Patient will demonstrate awareness during functional activites type of (SLP) Flowsheets (Taken 06/03/2024 1337) Patient will demonstrate during cognitive/linguistic activities awareness type of: Intellectual LTG: Patient will demonstrate awareness during cognitive/linguistic activities with assistance of (SLP): Minimal Assistance - Patient > 75%

## 2024-06-03 NOTE — Progress Notes (Signed)
 Inpatient Rehabilitation Care Coordinator Assessment and Plan Patient Details  Name: Donna Petty MRN: [id number] Date of Birth: March 08, 1936  Today's Date: 06/03/2024  Hospital Problems: Principal Problem:   Acute ischemic right posterior cerebral artery (PCA) stroke [facility])  Past Medical History:  Past Medical History:  Diagnosis Date   Acid reflux    Arthritis    Carotid artery disease    50-70% left internal carotid artery stenosis   Depression    Dyspnea    Hypothyroidism    Macular degeneration    Thyroid  condition    Visual changes    Past Surgical History:  Past Surgical History:  Procedure Laterality Date   CATARACT EXTRACTION, BILATERAL     CHOLECYSTECTOMY     COLONOSCOPY     ESOPHAGOGASTRODUODENOSCOPY ENDOSCOPY     IR ANGIO VERTEBRAL SEL SUBCLAVIAN INNOMINATE UNI R MOD SED  05/24/2024   IR PERCUTANEOUS ART THROMBECTOMY/INFUSION INTRACRANIAL INC DIAG ANGIO  05/24/2024   IR US  GUIDE VASC ACCESS RIGHT  05/24/2024   RADIOLOGY WITH ANESTHESIA N/A 05/24/2024   Procedure: RADIOLOGY WITH ANESTHESIA;  Surgeon: Radiologist, Medication, MD;  Location: [facility] OR;  Service: Radiology;  Laterality: N/A;   TOTAL HIP ARTHROPLASTY Left 01/26/2024   Procedure: ARTHROPLASTY, HIP, TOTAL, ANTERIOR APPROACH;  Surgeon: Ernie Cough, MD;  Location: [facility] ORS;  Service: Orthopedics;  Laterality: Left;   TOTAL KNEE ARTHROPLASTY Left 06/26/2020   Procedure: TOTAL KNEE ARTHROPLASTY;  Surgeon: Ernie Cough, MD;  Location: [facility] ORS;  Service: Orthopedics;  Laterality: Left;  70 mins   Social History:  reports that she has never smoked. She has never used smokeless tobacco. She reports that she does not drink alcohol and does not use drugs.  Family / Support Systems Marital Status: Widow/Widower Patient Roles: Parent Children: Donna Petty-daughter who is also her guardian [phone] Other Supports: Edwin-relative [phone] Anticipated Caregiver: [name] Ability/Limitations of Caregiver: Able to assist and  provide up to min assist pt was very independent prior to admissioin had help with socks and shoes and hair, but did own meds. Caregiver Availability: 24/7 Family Dynamics: Close with family and daughter has been her caregiver after her health issues. She was still using a rolling walker from her past hospitalization in 01/2024 but was doing well and could walk some without it  Social History Preferred language: English Religion: Non-Denominational Cultural Background: NA Education: HS Health Literacy - How often do you need to have someone help you when you read instructions, pamphlets, or other written material from your doctor or pharmacy?: Always Writes: Yes Employment Status: Retired Marine Scientist Issues: NA Guardian/Conservator: pt's daughter-Donna Petty is her guardian and makes decisions for her. According to MD she is not able to make them on her own either   Abuse/Neglect Abuse/Neglect Assessment Can Be Completed: Yes Physical Abuse: Denies Verbal Abuse: Denies Sexual Abuse: Denies Exploitation of patient/patient's resources: Denies Self-Neglect: Denies  Patient response to: Social Isolation - How often do you feel lonely or isolated from those around you?: Never  Emotional Status Pt's affect, behavior and adjustment status: Pt and daughter report she used a rolling walker in the home and outside used a rollator. She was able to do most everything except her hair, socks and shoes. She hopes to be able to to this at discharge. Her daughter is available to assist if needed. Recent Psychosocial Issues: other health issues-was doing well prior to this CVA Psychiatric History: Depression/Dementia-takes medications and feels they are helpful. It seems her dementia is mild and she was doing  well prior to this CVA Substance Abuse History: NA  Patient / Family Perceptions, Expectations & Goals Pt/Family understanding of illness & functional limitations: Pt and daughter can  explain her stroke and deficits. Daughter has spoken with the MD following and feels she understands the plan moving forward. Both are hopeful she will do well here Premorbid pt/family roles/activities: mom, retiree, grandmother, church member Anticipated changes in roles/activities/participation: resume Pt/family expectations/goals: Pt states:  I'm cold I need a blanket.   Daughter states:  I hope she does well and makes progress there.  Community [employer] Agencies: None Premorbid Home Care/DME Agencies: Other (Comment) (Had HH in 01/2024 have since stopped coming-has a rw and rollator) Transportation available at discharge: daughter Is the patient able to respond to transportation needs?: Yes In the past 12 months, has lack of transportation kept you from medical appointments or from getting medications?: No In the past 12 months, has lack of transportation kept you from meetings, work, or from getting things needed for daily living?: No  Discharge Planning Living Arrangements: Children Support Systems: Children, Other relatives, Friends/neighbors, Psychologist, Clinical community Type of Residence: Private residence Insurance Resources: [employer] (specify) ([employer]) Financial Resources: Social Security, Family Support Financial Screen Referred: No Living Expenses: Lives with family Money Management: Family Does the patient have any problems obtaining your medications?: No Home Management: daughter Patient/Family Preliminary Plans: Retrun home with daughter who does provide supervision and was helping with some dressing. Pt was able to do for herself and was ambulating better almost without a rolling walker. Aware being evaluated and goals being set for stay here. Care Coordinator Barriers to Discharge: Insurance for SNF coverage Care Coordinator Anticipated Follow Up Needs: HH/OP  Clinical Impression Pleasant patient who is willing to work on her deficits and  recover from this stroke. Her daughter is her guardian and assists with her care, whom pt lives with. Aware being evaluated and goals being set for her rehab stay. Made aware [name]-SW will be following up on Monday upon her return and assist with discharge needs.  Raymonde Asberry MATSU 06/03/2024, 10:08 AM

## 2024-06-03 NOTE — Evaluation (Signed)
 Physical Therapy Assessment and Plan  Patient Details  Name: Donna Petty MRN: [id number] Date of Birth: Dec 15, 1935  PT Diagnosis: Abnormal posture, Abnormality of gait, Coordination disorder, Difficulty walking, Hemiparesis non-dominant, Impaired cognition, Impaired sensation, Muscle weakness, and Pain in B LE. Rehab Potential: Fair ELOS: 12-14   Today's Date: 06/03/2024 PT Individual Time: 1100-1200 PT Individual Time Calculation (min): 60 min    Hospital Problem: Principal Problem:   Acute ischemic right posterior cerebral artery (PCA) stroke (HCC)   Past Medical History:  Past Medical History:  Diagnosis Date   Acid reflux    Arthritis    Carotid artery disease    50-70% left internal carotid artery stenosis   Depression    Dyspnea    Hypothyroidism    Macular degeneration    Thyroid  condition    Visual changes    Past Surgical History:  Past Surgical History:  Procedure Laterality Date   CATARACT EXTRACTION, BILATERAL     CHOLECYSTECTOMY     COLONOSCOPY     ESOPHAGOGASTRODUODENOSCOPY ENDOSCOPY     IR ANGIO VERTEBRAL SEL SUBCLAVIAN INNOMINATE UNI R MOD SED  05/24/2024   IR PERCUTANEOUS ART THROMBECTOMY/INFUSION INTRACRANIAL INC DIAG ANGIO  05/24/2024   IR US  GUIDE VASC ACCESS RIGHT  05/24/2024   RADIOLOGY WITH ANESTHESIA N/A 05/24/2024   Procedure: RADIOLOGY WITH ANESTHESIA;  Surgeon: Radiologist, Medication, MD;  Location: [facility] OR;  Service: Radiology;  Laterality: N/A;   TOTAL HIP ARTHROPLASTY Left 01/26/2024   Procedure: ARTHROPLASTY, HIP, TOTAL, ANTERIOR APPROACH;  Surgeon: Ernie Cough, MD;  Location: [facility];  Service: Orthopedics;  Laterality: Left;   TOTAL KNEE ARTHROPLASTY Left 06/26/2020   Procedure: TOTAL KNEE ARTHROPLASTY;  Surgeon: Ernie Cough, MD;  Location: [facility];  Service: Orthopedics;  Laterality: Left;  70 mins    Assessment & Plan Clinical Impression: Patient is a 88 y.o. year old female with history of depression, hypothyroidism, left ICA  stenosis 50-70%, hyperlipidemia, memory deficits /vascular dementia maintained on Namenda , GERD, macular degeneration. Per chart review patient lives with her children. 1 level home 6 steps to entry. Independent prior to admission however she will use a rollator at times within the community. Presented 05/24/2024 with acute onset of left-sided weakness right side gaze and left hemianopsia. Blood pressure elevated 190s systolic in the ED. Cranial CT scan showed stable chronic small vessel disease. CTA showed right PCA P1 segment occlusion bilateral proximal subclavian artery and vertebral artery origin atherosclerosis without hemodynamically significant stenosis. Right ICA origin plaque with 60% stenosis. Bilateral cervical ICA tortuosity and possible fibromuscular dysplasia. LICA siphon moderate supraclinoid stenosis. Patient did receive TNK. Underwent cerebral arteriogram attempted unsuccessful thrombectomy 05/24/2024 per Dr. Nancyann Burns. CTA angio of the head follow-up showed early right PCA territory cytotoxic edema without hemorrhagic transformation or mass effect. A follow-up MRI was completed showing large acute right PCA infarct with additional scattered small acute bilateral cerebral and cerebellar infarcts. Moderate cerebral atrophy with mild chronic small vessel ischemic disease with chronic lacunar infarct. Admission chemistries unremarkable except creatinine 1.28. Echocardiogram ejection fraction of 60 to 65% no wall motion abnormalities. Initially placed on aspirin  and Plavix for CVA prophylaxis. Hospital course cardiology services consulted 10/23 for evaluation of atrial flutter and patient ultimately has been placed on Eliquis for both CVA prophylaxis and a flutter and aspirin  and Plavix discontinued. Palliative care consulted to establish goals of care. Her diet has been advanced to a regular consistency.  Patient transferred to CIR on 06/02/2024 .   Patient currently requires  mod with mobility  secondary to muscle weakness and muscle joint tightness, decreased cardiorespiratoy endurance, impaired timing and sequencing, unbalanced muscle activation, decreased coordination, and decreased motor planning, decreased visual acuity, decreased midline orientation, decreased attention to left, left side neglect, and decreased motor planning, decreased initiation, decreased attention, decreased awareness, decreased problem solving, decreased safety awareness, decreased memory, and delayed processing, and decreased sitting balance, decreased standing balance, decreased postural control, hemiplegia, and decreased balance strategies.  Prior to hospitalization, patient was modified independent  with mobility and lived with Daughter in a Mobile home home.  Home access is 6Stairs to enter.  Patient will benefit from skilled PT intervention to maximize safe functional mobility, minimize fall risk, and decrease caregiver burden for planned discharge home with 24 hour supervision.  Anticipate patient will benefit from follow up HH at discharge.  PT - End of Session Activity Tolerance: Tolerates 30+ min activity with multiple rests Endurance Deficit: Yes Endurance Deficit Description: required frequent rest breaks PT Assessment Rehab Potential (ACUTE/IP ONLY): Fair PT Barriers to Discharge: Home environment access/layout;Behavior PT Barriers to Discharge Comments: self-limiting behaviors PT Patient demonstrates impairments in the following area(s): Balance;Behavior;Endurance;Motor;Pain;Perception;Safety;Sensory;Skin Integrity PT Transfers Functional Problem(s): Bed Mobility;Bed to Chair;Car;Furniture;Floor PT Locomotion Functional Problem(s): Ambulation;Wheelchair Mobility;Stairs PT Plan PT Intensity: Minimum of 1-2 x/day ,45 to 90 minutes PT Frequency: 5 out of 7 days PT Duration Estimated Length of Stay: 12-14 PT Treatment/Interventions: Ambulation/gait training;Balance/vestibular training;Cognitive  remediation/compensation;Discharge planning;DME/adaptive equipment instruction;Functional mobility training;Neuromuscular re-education;Pain management;Patient/family education;Psychosocial support;Skin care/wound management;Splinting/orthotics;Stair training;Therapeutic Activities;Therapeutic Exercise;Disease management/prevention;Community reintegration;UE/LE Strength taining/ROM;UE/LE Coordination activities;Visual/perceptual remediation/compensation;Wheelchair propulsion/positioning;Functional electrical stimulation PT Transfers Anticipated Outcome(s): supervision with LRAD PT Locomotion Anticipated Outcome(s): CGA with LRAD PT Recommendation Follow Up Recommendations: Home health PT Patient destination: Home Equipment Recommended: To be determined Equipment Details: has RW, rollator, cane   PT Evaluation Precautions/Restrictions Precautions Precautions: Fall Recall of Precautions/Restrictions: Impaired Precaution/Restrictions Comments: L neglect Restrictions Weight Bearing Restrictions Per Provider Order: No General   Vital Signs  Pain Pain Assessment Pain Scale: 0-10 Pain Score: 0-No pain Patients Stated Pain Goal: 0 Pain Interference Pain Interference Pain Effect on Sleep: 1. Rarely or not at all Pain Interference with Therapy Activities: 1. Rarely or not at all Pain Interference with Day-to-Day Activities: 1. Rarely or not at all Home Living/Prior Functioning Home Living Living Arrangements: Children Available Help at Discharge: Family Type of Home: Mobile home Home Access: Stairs to enter Entrance Stairs-Number of Steps: 6 Entrance Stairs-Rails: Right;Left;Can reach both Home Layout: One level Bathroom Shower/Tub: Engineer, Manufacturing Systems: Handicapped height Bathroom Accessibility: Yes  Lives With: Daughter Prior Function Level of Independence: Independent with transfers;Independent with homemaking with ambulation;Independent with gait  Able to Take  Stairs?: Yes Driving: No Vision/Perception  Vision - History Ability to See in Adequate Light: 1 Impaired Perception Perception: Impaired Preception Impairment Details: Inattention/Neglect Perception-Other Comments: L  Cognition Overall Cognitive Status: Difficult to assess (due to refusal) Arousal/Alertness: Awake/alert Orientation Level: Oriented X4 Year: 2025 Month: October Day of Week: Correct Attention: Sustained Sustained Attention: Appears intact Memory: Impaired Memory Impairment: Decreased long term memory Decreased Long Term Memory: Verbal basic Awareness: Impaired Awareness Impairment: Intellectual impairment Problem Solving: Impaired Safety/Judgment: Impaired Sensation Sensation Light Touch: Impaired by gross assessment Proprioception: Impaired by gross assessment Additional Comments: Light touch limited in L lateral thigh and heel Coordination Gross Motor Movements are Fluid and Coordinated: No Motor  Motor Motor: Hemiplegia;Abnormal postural alignment and control Motor - Skilled Clinical Observations: L hemiparesis and L inattention   Trunk/Postural Assessment  Cervical Assessment  Cervical Assessment: Exceptions to [facility] (forward head) Thoracic Assessment Thoracic Assessment: Exceptions to [facility] (rounded shoulders) Lumbar Assessment Lumbar Assessment: Exceptions to [facility] (sacral sitting; posterior pelvic tilt) Postural Control Postural Control: Deficits on evaluation  Balance Balance Balance Assessed: Yes Static Sitting Balance Static Sitting - Balance Support: Bilateral upper extremity supported;Feet supported Static Sitting - Level of Assistance: 6: Modified independent (Device/Increase time) Dynamic Sitting Balance Dynamic Sitting - Balance Support: Feet supported Dynamic Sitting - Level of Assistance: 5: Stand by assistance Static Standing Balance Static Standing - Balance Support: Bilateral upper extremity supported Static Standing - Level of  Assistance: 4: Min assist Dynamic Standing Balance Dynamic Standing - Balance Support: Right upper extremity supported;Left upper extremity supported Dynamic Standing - Level of Assistance: 3: Mod assist Dynamic Standing - Balance Activities: Reaching for objects;Forward lean/weight shifting Extremity Assessment  RUE Assessment RUE Assessment: Within Functional Limits Active Range of Motion (AROM) Comments: WFL LUE Assessment LUE Assessment: Exceptions to [facility] Active Range of Motion (AROM) Comments: WFL General Strength Comments: 5/5- decreased coordination RLE Assessment RLE Assessment: Exceptions to [facility] General Strength Comments: Ankle DF/PF 4-/5; knee ext 4/5; knee flex 4-/5; hip grossly 4-/5 LLE Assessment LLE Assessment: Exceptions to [facility] General Strength Comments: DF 2-/5; PF 3-/5; knee ext 3/5; knee flex 3-/5; hip grossly 3-/5  Care Tool Care Tool Bed Mobility Roll left and right activity   Roll left and right assist level: Moderate Assistance - Patient 50 - 74%    Sit to lying activity   Sit to lying assist level: Moderate Assistance - Patient 50 - 74%    Lying to sitting on side of bed activity   Lying to sitting on side of bed assist level: the ability to move from lying on the back to sitting on the side of the bed with no back support.: Moderate Assistance - Patient 50 - 74%     Care Tool Transfers Sit to stand transfer   Sit to stand assist level: Moderate Assistance - Patient 50 - 74%    Chair/bed transfer   Chair/bed transfer assist level: Moderate Assistance - Patient 50 - 74%    Car transfer   Car transfer assist level: Maximal Assistance - Patient 25 - 49%      Care Tool Locomotion Ambulation   Assist level: 2 helpers Assistive device: No Device Max distance: 19  Walk 10 feet activity   Assist level: 2 helpers Assistive device: No Device   Walk 50 feet with 2 turns activity Walk 50 feet with 2 turns activity did not occur: Safety/medical concerns  (weakness/fatigue/pain)      Walk 150 feet activity Walk 150 feet activity did not occur: Safety/medical concerns (weakness/fatigue/pain)      Walk 10 feet on uneven surfaces activity Walk 10 feet on uneven surfaces activity did not occur: Safety/medical concerns (weakness/fatigue/pain)      Stairs Stair activity did not occur: Safety/medical concerns (pain/weakness/fatigue)        Walk up/down 1 step activity Walk up/down 1 step or curb (drop down) activity did not occur: Safety/medical concerns (pain/weakness/fatigue)      Walk up/down 4 steps activity Walk up/down 4 steps activity did not occur: Safety/medical concerns (pain/weakness/fatigue)      Walk up/down 12 steps activity Walk up/down 12 steps activity did not occur: Safety/medical concerns (pain/weakness/fatigue)      Pick up small objects from floor Pick up small object from the floor (from standing position) activity did not occur: Safety/medical concerns (pain/weakness/fatigue)  Wheelchair Is the patient using a wheelchair?: Yes Type of Wheelchair: Manual Wheelchair activity did not occur: Refused      Wheel 50 feet with 2 turns activity Wheelchair 50 feet with 2 turns activity did not occur: Refused    Wheel 150 feet activity Wheelchair 150 feet activity did not occur: Refused      Refer to Care Plan for Long Term Goals  SHORT TERM GOAL WEEK 1 PT Short Term Goal 1 (Week 1): Pt will perform supine <> sit transfers with min A PT Short Term Goal 2 (Week 1): Pt will perform sit <> stand transfers with LRAD with min A PT Short Term Goal 3 (Week 1): Pt will ambulate 50 ft using LRAD with min A  Recommendations for other services: None   Skilled Therapeutic Intervention  Evaluation completed (see details above) with patient education regarding purpose of PT evaluation, PT POC and goals, therapy schedule, weekly team meetings, and other CIR information including safety plan and fall risk safety. Pt agreeable  to PT evaluation and denied pain. Pt presents with L visual deficits. L inattention/neglect also noted during. Pt demonstrated limited endurance and activity tolerance. Pt also demonstrated self-limiting behaviors. Pt reported some tenderness to touch in B LE during MMT assessment - MD notified when he came by. Pt performed the below functional mobility tasks with the specified levels of skilled cuing and assistance.  Mobility Bed Mobility Bed Mobility: Rolling Right;Rolling Left;Sit to Supine;Supine to Sit Rolling Right: Minimal Assistance - Patient > 75% (used bed rail) Rolling Left: Moderate Assistance - Patient 50-74% (used bed rail) Supine to Sit: Moderate Assistance - Patient 50-74% Sit to Supine: Moderate Assistance - Patient 50-74% Transfers Transfers: Sit to Stand;Stand to Sit;Stand Pivot Transfers Sit to Stand: Moderate Assistance - Patient 50-74% Stand to Sit: Moderate Assistance - Patient 50-74% Stand Pivot Transfers: Moderate Assistance - Patient 50 - 74% Stand Pivot Transfer Details: Verbal cues for safe use of DME/AE;Verbal cues for sequencing;Verbal cues for technique;Verbal cues for precautions/safety Transfer (Assistive device): None Locomotion  Gait Ambulation: Yes Gait Assistance: 2 Helpers Gait Distance (Feet): 19 Feet Assistive device: None Gait Assistance Details: Verbal cues for sequencing;Verbal cues for technique;Verbal cues for precautions/safety;Verbal cues for gait pattern;Verbal cues for safe use of DME/AE Gait Gait: Yes Gait Pattern: Impaired Gait Pattern: Step-to pattern;Decreased step length - right;Decreased step length - left;Decreased stance time - right;Decreased stance time - left;Decreased stride length;Decreased hip/knee flexion - right;Decreased hip/knee flexion - left;Decreased dorsiflexion - right;Decreased dorsiflexion - left;Trunk flexed;Decreased trunk rotation;Poor foot clearance - right;Poor foot clearance - left Gait velocity:  decreased Stairs / Additional Locomotion Stairs: No Wheelchair Mobility Wheelchair Mobility: No   Discharge Criteria: Patient will be discharged from PT if patient refuses treatment 3 consecutive times without medical reason, if treatment goals not met, if there is a change in medical status, if patient makes no progress towards goals or if patient is discharged from hospital.  The above assessment, treatment plan, treatment alternatives and goals were discussed and mutually agreed upon: by patient  [name], DPT 06/03/2024, 1:51 PM

## 2024-06-03 NOTE — Discharge Summary (Signed)
 Physician Discharge Summary  Patient ID: Donna Petty MRN: [id number] DOB/AGE: 1935-10-07 88 y.o.  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 Automated exposure control, iterative reconstruction, and/or weight based adjustment of the mA/kV was utilized to reduce the radiation dose to as low as reasonably achievable. COMPARISON: None available. CLINICAL HISTORY: Stroke, follow up. FINDINGS: BRAIN AND VENTRICLES: No acute hemorrhage. Evolving right PCA territory infarct. No evidence of acute hemorrhage or progressive mass effect. Remote right basal ganglia lacunar infarct. Small remote right greater than left cerebellar infarcts. No hydrocephalus. No extra-axial collection. No midline shift. ORBITS: No acute abnormality. SINUSES: No acute abnormality. SOFT TISSUES AND SKULL: No acute soft tissue abnormality. No skull fracture. IMPRESSION: 1. Evolving right PCA territory infarct. No evidence of acute hemorrhage or progressive mass effect. Electronically signed by: Gilmore Molt MD 05/29/2024 05:45 PM EDT RP Workstation: [id number]   ECHOCARDIOGRAM COMPLETE Result Date: 05/25/2024    ECHOCARDIOGRAM REPORT   Patient Name:   Donna Petty Date of Exam: 05/25/2024 Medical Rec #:  [id number]     Height:       61.5 in Accession #:    [id number]     Weight:       170.6 lb Date of Birth:  1936/06/05     BSA:          1.776 m Patient Age:    88 years      BP:           140/97 mmHg Patient Gender: F             HR:           84 bpm. Exam Location:  Inpatient Procedure: 2D Echo (Both Spectral and Color Flow Doppler were utilized during            procedure). Indications:   Stroke  History:       Patient has no prior history of Echocardiogram examinations.                Stroke.  Sonographer:   Norleen Amour Referring      [id number] [facility] Phys: IMPRESSIONS  1. Left ventricular ejection fraction, by estimation, is 60 to 65%. The left ventricle has normal function. The left ventricle has no regional wall motion abnormalities. There is mild asymmetric left ventricular hypertrophy of the basal-septal segment. Left ventricular diastolic parameters were normal.  2. Right ventricular systolic function is normal. The right ventricular size is normal. There is normal pulmonary artery systolic pressure.  3. The mitral valve is normal in structure. Mild mitral valve regurgitation. No evidence of mitral stenosis.  4. The aortic valve is grossly normal. There is mild calcification of the aortic valve. Aortic valve regurgitation is not visualized. Aortic valve sclerosis is present, with no evidence of aortic valve stenosis.  5. The inferior vena cava is normal in size with greater than 50% respiratory variability, suggesting right atrial pressure of 3 mmHg. Comparison(s): No prior  Echocardiogram. Conclusion(s)/Recommendation(s): Otherwise normal echocardiogram, with minor abnormalities described in the report. No intracardiac source of embolism detected on this transthoracic study. Consider a transesophageal echocardiogram to exclude cardiac source of embolism if clinically indicated. FINDINGS  Left Ventricle: Left ventricular ejection fraction, by estimation, is 60 to 65%. The left ventricle has normal function. The left ventricle has no regional wall motion  abnormalities. The left ventricular internal cavity size was normal in size. There is  mild asymmetric left ventricular hypertrophy of the basal-septal segment. Left ventricular diastolic parameters were normal. Right Ventricle: The right ventricular size is normal. Right vetricular wall thickness was not well visualized. Right ventricular systolic function is normal. There is normal pulmonary artery systolic pressure. The tricuspid regurgitant velocity is 2.68 m/s, and with an assumed right atrial pressure of 3 mmHg, the estimated right ventricular systolic pressure is 31.7 mmHg. Left Atrium: Left atrial size was normal in size. Right Atrium: Right atrial size was normal in size. Pericardium: There is no evidence of pericardial effusion. Mitral Valve: The mitral valve is normal in structure. Mild mitral valve regurgitation. No evidence of mitral valve stenosis. Tricuspid Valve: The tricuspid valve is grossly normal. Tricuspid valve regurgitation is trivial. No evidence of tricuspid stenosis. Aortic Valve: The aortic valve is grossly normal. There is mild calcification of the aortic valve. Aortic valve regurgitation is not visualized. Aortic valve sclerosis is present, with no evidence of aortic valve stenosis. Pulmonic Valve: The pulmonic valve was not well visualized. Pulmonic valve regurgitation is trivial. No evidence of pulmonic stenosis. Aorta: The aortic root and ascending aorta are structurally normal, with no evidence of dilitation. Venous: The inferior vena cava is normal in size with greater than 50% respiratory variability, suggesting right atrial pressure of 3 mmHg. IAS/Shunts: The atrial septum is grossly normal.  LEFT VENTRICLE PLAX 2D LVIDd:         4.40 cm     Diastology LVIDs:         2.50 cm     LV e' medial:    6.74 cm/s LV PW:         1.00 cm     LV E/e' medial:  14.2 LV IVS:        1.30 cm     LV e' lateral:   10.80 cm/s LVOT diam:     1.80 cm     LV E/e' lateral: 8.8 LV SV:         32 LV SV  Index:   18 LVOT Area:     2.54 cm LV IVRT:       74 msec  LV Volumes (MOD) LV vol d, MOD A2C: 56.8 ml LV vol d, MOD A4C: 70.6 ml LV vol s, MOD A2C: 20.3 ml LV vol s, MOD A4C: 29.6 ml LV SV MOD A2C:     36.5 ml LV SV MOD A4C:     70.6 ml LV SV MOD BP:      43.9 ml RIGHT VENTRICLE             IVC RV Basal diam:  3.90 cm     IVC diam: 1.70 cm RV S prime:     11.40 cm/s TAPSE (M-mode): 2.0 cm LEFT ATRIUM             Index        RIGHT ATRIUM           Index LA diam:        4.00 cm 2.25 cm/m  RA Area:     15.00 cm LA Vol (A2C):   46.9 ml 26.40 ml/m  RA Volume:   37.10 ml  20.89 ml/m LA Vol (A4C):   45.7 ml 25.73 ml/m LA Biplane Vol: 47.5 ml 26.74 ml/m  AORTIC VALVE             PULMONIC VALVE LVOT Vmax:   60.00 cm/s  PV Vmax:       0.72 m/s LVOT Vmean:  42.300 cm/s PV Peak grad:  2.1 mmHg LVOT VTI:    0.127 m  AORTA Ao Root diam: 2.80 cm Ao Asc diam:  3.20 cm MITRAL VALVE               TRICUSPID VALVE MV Area (PHT): 3.74 cm    TR Peak grad:   28.7 mmHg MV Decel Time: 203 msec    TR Vmax:        268.00 cm/s MV E velocity: 95.40 cm/s MV A velocity: 63.40 cm/s  SHUNTS MV E/A ratio:  1.50        Systemic VTI:  0.13 m                            Systemic Diam: 1.80 cm Shelda Bruckner MD Electronically signed by Shelda Bruckner MD Signature Date/Time: 05/25/2024/6:04:15 PM    Final    MR BRAIN WO CONTRAST Result Date: 05/25/2024 EXAM: MRI BRAIN WITHOUT CONTRAST 05/25/2024 11:22:00 AM TECHNIQUE: Multiplanar multisequence MRI of the head/brain was performed without the administration of intravenous contrast. COMPARISON: CTA head and neck 05/25/2024. CLINICAL HISTORY: Acute neuro deficit, stroke suspected; 24 hours post tnkase. FINDINGS: LIMITATIONS: The examination is mildly to moderately motion degraded. BRAIN AND VENTRICLES: There is a large acute right PCA infarct involving the right temporal and occipital lobes as well as right thalamus with cytotoxic edema but no significant associated mass effect or  hemorrhage. Additional scattered small acute infarcts are present in both cerebellar hemispheres, left temporal and occipital lobes, both parietal lobes, and posterior right frontal lobe as well as along the right caudate body. There are small chronic infarcts involving the right basal ganglia and both cerebellar hemispheres. Small chronic hemorrhages are present in the cerebellum and midbrain. Mild T2 hyperintensities elsewhere in the cerebral white matter bilaterally are nonspecific but compatible with chronic small vessel ischemic disease. There is moderate cerebral atrophy. No mass, midline shift, hydrocephalus, or extra axial fluid collection is identified. Abnormal signal is noted along the course of the proximal right PCA corresponding to the occlusion shown on CTA. ORBITS: Bilateral cataract extraction. SINUSES AND MASTOIDS: Small right mastoid effusion. Clear paranasal sinuses. BONES AND SOFT TISSUES: Normal marrow signal. No acute soft tissue abnormality. IMPRESSION: 1. Large acute right PCA infarct. 2. Additional scattered small acute bilateral cerebral and cerebellar infarcts. 3. Moderate cerebral atrophy and mild chronic small vessel ischemic disease with chronic lacunar infarcts as above. Electronically signed by: Dasie Hamburg MD 05/25/2024 11:43 AM EDT RP Workstation: [id number]   IR PERCUTANEOUS ART THROMBECTOMY/INFUSION INTRACRANIAL INC DIAG ANGIO Result Date: 05/25/2024 INDICATION: Acute Large Vessel occlusion, right posterior cerebral artery EXAM: Mechanical thrombectomy, attempted CONSENT: Consent was obtained from the patient's daughter MEDICATIONS: No additional ANESTHESIA/SEDATION: General CONTRAST:  42mL OMNIPAQUE IOHEXOL 300 MG/ML  SOLN FLUOROSCOPY: Radiation Exposure Index (as provided by the fluoroscopic device): 985 MGy reference air Kerma COMPLICATIONS: None immediate. TECHNIQUE: Maximal Sterile Barrier Technique was utilized including caps, mask, sterile gowns, sterile gloves,  sterile drape,  hand hygiene and skin antiseptic. A timeout was performed prior to the initiation of the procedure. PROCEDURE: Femoral access was obtained with ultrasound using direct visualization of the needle puncture into the artery. A 80 cm neuron max sheath was placed. A penumbra select Berenstein catheter was then used to catheterize the left vertebral artery for angiography. This demonstrated an extremely tortuous left vertebral artery. The intracranial images demonstrated occlusion of the right posterior cerebral artery. The left was patent. The basilar artery was normal. The neuron max was advanced over the penumbra select Berenstein into the origin of the left vertebral artery. An attempt was made to pass a catalyst 6 aspiration catheter over a Trevo Trak microcatheter and synchro support wire, which was unsuccessful due to tortuosity of the vertebral artery. The neuron max catheter was exchanged over a Bentson wire and a 55 cm 8 French Terumo sheath was placed for more support. An attempt was made to advance a zoom support catheter into the vertebral artery which was not successful. The neuron max was then replaced and advanced into the origin of the vertebral artery. It was then possible to advance a Vecta 46 catheter over the TREVO TRAK microcatheter and synchro support wire about midway up the vertebral artery, but no further. An Amplatz wire was then advanced through the Vecta 46 and a penumbra select Esaw was advanced over the wire which permitted advancement of the neuron max about a 3rd of the way up the vertebral artery. It was then possible to advance the Vecta 46 catheter over the TREVO TRAK microcatheter into the lower basilar artery. However, the catheter could not be advanced into the right posterior cerebral artery due to the extreme proximal tortuosity. The left vertebral artery approach was abandoned. A Simmons 2 catheter was used for catheterization of the right subclavian artery  and right vertebral origin to visualize the right vertebral artery. This also had extreme tortuosity and the decision was made not to attempt catheterization as it was not likely to be successful. Hemostasis was obtained with Angio-Seal. FINDINGS: Left vertebral: The left vertebral artery is extremely tortuous. It is otherwise normal. The intracranial images demonstrate a normal basilar artery and normal left posterior cerebral artery. There is a proximal occlusion of the right posterior cerebral artery. Right vertebral: The right vertebral artery is extremely tortuous. Otherwise normal. Left vertebral after attempted thrombectomy: There is no dissection or other complication. The intracranial images are unchanged. Right posterior cerebral artery occlusion but no other occlusion. IMPRESSION: Right posterior cerebral artery embolus Embolectomy unsuccessful due to extreme proximal tortuosity of the vertebral arteries. Codes: 62784, [id number], [id number] Electronically Signed   By: Nancyann Burns M.D.   On: 05/25/2024 08:51   IR US  Guide Vasc Access Right Result Date: 05/25/2024 INDICATION: Acute Large Vessel occlusion, right posterior cerebral artery EXAM: Mechanical thrombectomy, attempted CONSENT: Consent was obtained from the patient's daughter MEDICATIONS: No additional ANESTHESIA/SEDATION: General CONTRAST:  42mL OMNIPAQUE IOHEXOL 300 MG/ML  SOLN FLUOROSCOPY: Radiation Exposure Index (as provided by the fluoroscopic device): 985 MGy reference air Kerma COMPLICATIONS: None immediate. TECHNIQUE: Maximal Sterile Barrier Technique was utilized including caps, mask, sterile gowns, sterile gloves, sterile drape, hand hygiene and skin antiseptic. A timeout was performed prior to the initiation of the procedure. PROCEDURE: Femoral access was obtained with ultrasound using direct visualization of the needle puncture into the artery. A 80 cm neuron max sheath was placed. A penumbra select Berenstein catheter was then used to  catheterize the left vertebral artery for angiography.  This demonstrated an extremely tortuous left vertebral artery. The intracranial images demonstrated occlusion of the right posterior cerebral artery. The left was patent. The basilar artery was normal. The neuron max was advanced over the penumbra select Berenstein into the origin of the left vertebral artery. An attempt was made to pass a catalyst 6 aspiration catheter over a Trevo Trak microcatheter and synchro support wire, which was unsuccessful due to tortuosity of the vertebral artery. The neuron max catheter was exchanged over a Bentson wire and a 55 cm 8 French Terumo sheath was placed for more support. An attempt was made to advance a zoom support catheter into the vertebral artery which was not successful. The neuron max was then replaced and advanced into the origin of the vertebral artery. It was then possible to advance a Vecta 46 catheter over the TREVO TRAK microcatheter and synchro support wire about midway up the vertebral artery, but no further. An Amplatz wire was then advanced through the Vecta 46 and a penumbra select Esaw was advanced over the wire which permitted advancement of the neuron max about a 3rd of the way up the vertebral artery. It was then possible to advance the Vecta 46 catheter over the TREVO TRAK microcatheter into the lower basilar artery. However, the catheter could not be advanced into the right posterior cerebral artery due to the extreme proximal tortuosity. The left vertebral artery approach was abandoned. A Simmons 2 catheter was used for catheterization of the right subclavian artery and right vertebral origin to visualize the right vertebral artery. This also had extreme tortuosity and the decision was made not to attempt catheterization as it was not likely to be successful. Hemostasis was obtained with Angio-Seal. FINDINGS: Left vertebral: The left vertebral artery is extremely tortuous. It is otherwise  normal. The intracranial images demonstrate a normal basilar artery and normal left posterior cerebral artery. There is a proximal occlusion of the right posterior cerebral artery. Right vertebral: The right vertebral artery is extremely tortuous. Otherwise normal. Left vertebral after attempted thrombectomy: There is no dissection or other complication. The intracranial images are unchanged. Right posterior cerebral artery occlusion but no other occlusion. IMPRESSION: Right posterior cerebral artery embolus Embolectomy unsuccessful due to extreme proximal tortuosity of the vertebral arteries. Codes: 62784, [id number], [id number] Electronically Signed   By: Nancyann Burns M.D.   On: 05/25/2024 08:51   IR ANGIO VERTEBRAL SEL SUBCLAVIAN INNOMINATE UNI R MOD SED Result Date: 05/25/2024 INDICATION: Acute Large Vessel occlusion, right posterior cerebral artery EXAM: Mechanical thrombectomy, attempted CONSENT: Consent was obtained from the patient's daughter MEDICATIONS: No additional ANESTHESIA/SEDATION: General CONTRAST:  42mL OMNIPAQUE IOHEXOL 300 MG/ML  SOLN FLUOROSCOPY: Radiation Exposure Index (as provided by the fluoroscopic device): 985 MGy reference air Kerma COMPLICATIONS: None immediate. TECHNIQUE: Maximal Sterile Barrier Technique was utilized including caps, mask, sterile gowns, sterile gloves, sterile drape, hand hygiene and skin antiseptic. A timeout was performed prior to the initiation of the procedure. PROCEDURE: Femoral access was obtained with ultrasound using direct visualization of the needle puncture into the artery. A 80 cm neuron max sheath was placed. A penumbra select Berenstein catheter was then used to catheterize the left vertebral artery for angiography. This demonstrated an extremely tortuous left vertebral artery. The intracranial images demonstrated occlusion of the right posterior cerebral artery. The left was patent. The basilar artery was normal. The neuron max was advanced over the penumbra  select Berenstein into the origin of the left vertebral artery. An attempt was made to pass a  catalyst 6 aspiration catheter over a Trevo Trak microcatheter and synchro support wire, which was unsuccessful due to tortuosity of the vertebral artery. The neuron max catheter was exchanged over a Bentson wire and a 55 cm 8 French Terumo sheath was placed for more support. An attempt was made to advance a zoom support catheter into the vertebral artery which was not successful. The neuron max was then replaced and advanced into the origin of the vertebral artery. It was then possible to advance a Vecta 46 catheter over the TREVO TRAK microcatheter and synchro support wire about midway up the vertebral artery, but no further. An Amplatz wire was then advanced through the Vecta 46 and a penumbra select Esaw was advanced over the wire which permitted advancement of the neuron max about a 3rd of the way up the vertebral artery. It was then possible to advance the Vecta 46 catheter over the TREVO TRAK microcatheter into the lower basilar artery. However, the catheter could not be advanced into the right posterior cerebral artery due to the extreme proximal tortuosity. The left vertebral artery approach was abandoned. A Simmons 2 catheter was used for catheterization of the right subclavian artery and right vertebral origin to visualize the right vertebral artery. This also had extreme tortuosity and the decision was made not to attempt catheterization as it was not likely to be successful. Hemostasis was obtained with Angio-Seal. FINDINGS: Left vertebral: The left vertebral artery is extremely tortuous. It is otherwise normal. The intracranial images demonstrate a normal basilar artery and normal left posterior cerebral artery. There is a proximal occlusion of the right posterior cerebral artery. Right vertebral: The right vertebral artery is extremely tortuous. Otherwise normal. Left vertebral after attempted  thrombectomy: There is no dissection or other complication. The intracranial images are unchanged. Right posterior cerebral artery occlusion but no other occlusion. IMPRESSION: Right posterior cerebral artery embolus Embolectomy unsuccessful due to extreme proximal tortuosity of the vertebral arteries. Codes: 62784, [id number], [id number] Electronically Signed   By: Nancyann Burns M.D.   On: 05/25/2024 08:51   CT ANGIO HEAD NECK W WO CM Result Date: 05/25/2024 EXAM: CTA Head and Neck with Intravenous Contrast. CT Head without Contrast. CLINICAL HISTORY: 88 year old female with headache, neuro deficit, left pupil change, and profuse vomiting. Status post code stroke presentation yesterday with right PCA occlusion. TECHNIQUE: Axial CTA images of the head and neck performed with 75 mL of iohexol (OMNIPAQUE) 350 MG/ML injection. MIP reconstructed images were created and reviewed. Axial computed tomography images of the head/brain performed without intravenous contrast. Note: Per PQRS, the description of internal carotid artery percent stenosis, including 0 percent or normal exam, is based on [place] [employer] Symptomatic Carotid Endarterectomy Trial (NASCET) criteria. Dose reduction technique was used including one or more of the following: automated exposure control, adjustment of mA and kV according to patient size, and/or iterative reconstruction. CONTRAST: With 75 mL of iohexol (OMNIPAQUE) 350 MG/ML injection. COMPARISON: CT head and CTA head and neck 05/24/2024. FINDINGS: CT HEAD: BRAIN: Fairly subtle asymmetric hypodensity now in the right PCA territory such as on coronal image 23 of series 8. This is compatible with cytotoxic edema. No significant intracranial mass effect. No hemorrhagic transformation. Elsewhere stable noncontrast CT appearance of the brain including chronic right basal ganglia lacunar infarct. No acute intraparenchymal hemorrhage. No mass lesion. No midline shift or extra-axial collection. VENTRICLES:  No hydrocephalus. ORBITS: The orbits are unremarkable. SINUSES AND MASTOIDS: The paranasal sinuses and mastoid air cells are clear. CTA NECK: COMMON  CAROTID ARTERIES: No significant stenosis. No dissection or occlusion. INTERNAL CAROTID ARTERIES: Stable right carotid bifurcation atherosclerosis resulting in estimated 60% right ICA origin stenosis. Stable left carotid bifurcation atherosclerosis without stenosis. 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 No dissection or occlusion. VERTEBRAL ARTERIES: Stable left subclavian artery and left vertebral artery origin atherosclerosis with mild stenosis of the left vertebral artery origin suspected and stable. Comparatively mild proximal right subclavian artery atherosclerosis. Both cervical vertebral arteries remain patent and highly tortuous to the skull base. No dissection or occlusion. CTA HEAD: ANTERIOR CEREBRAL ARTERIES: No significant stenosis. No occlusion. No aneurysm. MIDDLE CEREBRAL ARTERIES: No significant stenosis. No occlusion. No aneurysm. POSTERIOR CEREBRAL ARTERIES: Stable posterior circulation, unchanged right PCA P1 segment occlusion. No aneurysm. BASILAR ARTERY: No significant stenosis. No occlusion. No aneurysm. INTRACRANIAL INTERNAL CAROTID ARTERIES: Mild ICA siphon atherosclerosis. OTHER: Stable aortic arch atherosclerosis. Major dural venous sinuses are enhancing and appear to be patent. SOFT TISSUES: Visible upper chest stable nonvascular neck soft tissues are stable. No acute finding. No masses or lymphadenopathy. BONES: The osseous structures are stable from the CTA yesterday. IMPRESSION: 1. Early right PCA territory cytotoxic edema without hemorrhagic transformation or mass effect. 2. Stable CTA head and neck since yesterday. Unchanged right PCA P1 segment occlusion. 60% stenosis right ICA origin. 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 Electronically signed by: Helayne Hurst  MD 05/25/2024 05:27 AM EDT RP Workstation: [id number]   CT ANGIO HEAD NECK W WO CM (CODE STROKE) Result Date: 05/24/2024 EXAM: CTA HEAD AND NECK WITHOUT AND WITH 05/24/2024 11:11:18 AM TECHNIQUE: CTA of the head and neck was performed without and with the administration of 75 mL of intravenous contrast (iohexol (OMNIPAQUE) 350 MG/ML injection 75 mL IOHEXOL 350 MG/ML SOLN). Multiplanar 2D and/or 3D reformatted images are provided for review. Automated exposure control, iterative reconstruction, and/or weight based adjustment of the mA/kV was utilized to reduce the radiation dose to as low as reasonably achievable. Stenosis of the internal carotid arteries measured using NASCET criteria. COMPARISON: CT recorded separately. CLINICAL HISTORY: 88 year old female. Neuro deficit, acute, stroke suspected. Code stroke. FINDINGS: CTA NECK: AORTIC ARCH AND ARCH VESSELS: 3 vessel aortic arch with intermittent calcified atherosclerosis. CERVICAL CAROTID ARTERIES: Mildly tortuous brachiocephalic artery and right CCA. Right ICA origin calcified plaque narrowing the vessel to 1.9 mm versus 4.7 mm normally, resulting in 59% stenosis. Tortuous right ICA below the skull base. Calcified plaque at the distal left CCA and into the left carotid bifurcation resulting in less than 50% stenosis. Highly tortuous left ICA distal to the bulb also with a beaded appearance suggesting fibromuscular dysplasia (series 17 image 23). CERVICAL VERTEBRAL ARTERIES: Proximal right subclavian calcified plaque not resulting in significant stenosis of the proximal right subclavian artery or the right vertebral artery origin. Tortuous right vertebral artery is patent to the skull base without additional plaque. Moderate soft and calcified plaque of the proximal left subclavian artery and left vertebral artery origin resulting in less than 50% stenosis. Tortuous and codominant left vertebral artery is patent to the skull base, mildly beaded appearance and  mild V3 segment calcified plaque. LUNGS AND MEDIASTINUM: Negative visible superior mediastinum and lungs. SOFT TISSUES: Coarsely calcified left thyroid  appears benign (no follow up imaging recommended). No acute abnormality. BONES: Absent dentition, hyperostosis and degeneration in the visible spine. No acute abnormality. CTA HEAD: ANTERIOR CIRCULATION: Both ICA siphons are patent. Left siphon atherosclerosis with up to moderate supraclinoid  left siphon stenosis on series 13 image 107. Right siphon plaque without significant stenosis. Patent carotid terminus, normal MCA and ACA origins. Normal anterior communicating artery with a median artery of the corpus callosum (normal variant). Bilateral MCA and ACA branches are patent. Isolated mild stenosis of right MCA M2 branches. No aneurysm. POSTERIOR CIRCULATION: Codominant distal vertebral arteries, vertebrobasilar junction, and hypo origins are patent without plaque or stenosis. Patent basilar artery. Patent PCA origins. Right PCA P1 segment is occluded abruptly (series 17 image 27), with no reconstituted flow. Left PCA is patent with minimal limits. No aneurysm. OTHER: Major dural venous sinuses appear enhancing and patent. IMPRESSION: 1. Right PCA P1 segment occlusion. This was discussed by telephone with Dr. Vanessa at 1112 hours. 2. Bilateral proximal subclavian artery and vertebral artery origin atherosclerosis without hemodynamically significant stenosis. 3. Right ICA origin plaque with 60% stenosis. Bilateral cervical ICA tortuosity and possible fibromuscular dysplasia (FMD). 4. LICA siphon moderate supraclinoid stenosis.  Mild Right M2 branch stenoses. Electronically signed by: Helayne Hurst MD 05/24/2024 11:25 AM EDT RP Workstation: [id number]   CT HEAD CODE STROKE WO CONTRAST Result Date: 05/24/2024 EXAM: CT HEAD WITHOUT CONTRAST 05/24/2024 10:57:56 AM TECHNIQUE: CT of the head was performed without the administration of intravenous contrast. Automated  exposure control, iterative reconstruction, and/or weight based adjustment of the mA/kV was utilized to reduce the radiation dose to as low as reasonably achievable. COMPARISON: Head CT 829 23. CLINICAL HISTORY: 88 year old female with acute neuro deficit, stroke suspected, right gaze, left side weakness, LKW 1000. FINDINGS: BRAIN AND VENTRICLES: No acute hemorrhage. No evidence of acute infarct. Chronic lacunar infarct of the right basal ganglia is stable. Brain volume and ventricle size are stable. Chronic basal ganglia vascular calcifications. Mild additional patchy and scattered white matter hypodensity for age as not significantly changed. No hydrocephalus. No extra-axial collection. No mass effect or midline shift. No suspicious intracranial vascular hyperdensity. ORBITS: No acute abnormality. SINUSES: No acute abnormality. SOFT TISSUES AND SKULL: Mild rightward gaze. No acute soft tissue abnormality. No skull fracture. [place] stroke program early CT score (aspects) ----- Ganglionic (caudate, ic, lentiform nucleus, insula, M1-m3): 7 Supraganglionic (m4-m6): 3 Total: 10 IMPRESSION: 1. ASPECTS 10.  Stable chronic small vessel disease by CT. 2. These results were communicated to Dr. Vanessa at 11:04 hours on 05/24/2024 by text page via the [facility] messaging system. Electronically signed by: Helayne Hurst MD 05/24/2024 11:06 AM EDT RP Workstation: [id number]    Labs:  Basic Metabolic Panel: Recent Labs  Lab 06/09/24 0502 06/13/24 0502  NA 141 142  K 4.4 4.1  CL 107 108  CO2 24 25  GLUCOSE 91 90  BUN 18 19  CREATININE 1.27* 1.29*  CALCIUM  9.0 8.9    CBC: Recent Labs  Lab 06/09/24 0502 06/13/24 0502  WBC 7.3 7.4  HGB 13.0 12.7  HCT 39.1 38.5  MCV 91.8 92.8  PLT 346 310    CBG: No results for input(s): GLUCAP in the last 168 hours.  Family history.  Father with hypertension.  Denies any colon cancer or esophageal cancer or rectal cancer  Brief HPI:   Donna Petty is a 88 y.o.  right-handed female with history significant for vascular dementia maintained on Namenda , hypothyroidism left ICA stenosis hyperlipidemia, GERD, macular degeneration.  Per chart review patient lives with her children.  1 level home 6 steps to enter independent prior to admission will use a rollator at times within the community.  Presented 05/24/2024 with acute onset of  left-sided weakness and right-sided gaze with left hemianopsia.  Blood pressure elevated 190 systolic in the ED.  Cranial CT scan showed stable chronic small vessel disease.  CTA showed right PCA P1 segment occlusion bilateral proximal subclavian artery and vertebral artery origin atherosclerosis without hemodynamically significant stenosis.  Right ICA origin plaque with 60% stenosis.  Bilateral cervical ICA tortuosity and possible fibromuscular dysplasia.  Patient did receive TNK.  Underwent cerebral arteriogram attempted unsuccessful thrombectomy 05/24/2024 per interventional radiology.  CT angio of the head follow-up showed early right PCA territory cytotoxic edema without hemorrhagic transformation or mass effect.  A follow-up MRI was completed showing large acute right PCA infarct with additional scattered small acute bilateral cerebral and cerebellar infarcts.  Moderate cerebral atrophy with mild chronic small vessel ischemic disease with chronic lacunar infarct.  Admission chemistries unremarkable except creatinine 1.28.  Echocardiogram with ejection fraction of 60 to 65% no wall motion abnormalities.  Initially placed on aspirin  and Plavix for CVA prophylaxis.  Hospital course cardiology services consulted 10/23 for evaluation of atrial flutter and patient ultimately had been placed on Eliquis for both CVA prophylaxis and a flutter with aspirin  and Plavix discontinued.  Palliative care consulted to establish goals of care.  Therapy evaluations completed due to patient's decreased functional mobility was admitted for a comprehensive rehab  program.   Hospital Course: Donna Petty was admitted to rehab 06/02/2024 for inpatient therapies to consist of PT, ST and OT at least three hours five days a week. Past admission physiatrist, therapy team and rehab RN have worked together to provide customized collaborative inpatient rehab.  Pertaining to patient's right PCA large infarct with multiple small scattered infarcts in bilateral hemispheres with right P1 occlusion status post IR but unsuccessful due to tortuosity of vessel.  Patient followed by neurology services.  Palliative care was consulted to establish goals of care.  Currently maintained on Eliquis for both CVA prophylaxis bouts of atrial flutter followed by cardiology services cardiac rate controlled.  Mood stabilization vascular dementia she continued on Namenda  as well as Remeron  with Seroquel to help aid in sleep.  She was attending full therapies and cooperative.  Blood pressure is controlled and monitor on low-dose Norvasc as well as the addition of Avapro monitored with increased mobility follow-up outpatient.  Lipitor ongoing for hyperlipidemia.  Hormone supplement for hypothyroidism.   Blood pressures were monitored on TID basis and remained controlled and monitored  Diabetes has been monitored with ac/hs CBG checks and SSI was use prn for tighter BS control.    Rehab course: During patient's stay in rehab weekly team conferences were held to monitor patient's progress, set goals and discuss barriers to discharge. At admission, patient required  He/She  has had improvement in activity tolerance, balance, postural control as well as ability to compensate for deficits. He/She has had improvement in functional use RUE/LUE  and RLE/LLE as well as improvement in awareness.  Occupational therapy sessions focused on use of upper extremity visual deficits functional mobility and transfers.  Patient completed transfer to chair contact-guard to minimal assist with moderate cues.   Patient completed grooming at sink with set up assist.  Patient transported to the gym to complete visual field activities utilizing scanning and peripheral vision.  Patient able to complete utilizing colored circles and aquigz with good skill.  Completed functional mobility 2 x 45 feet rolling walker contact-guard.  Completed wheelchair transfer to bed with minimal assist.  During physical therapy sessions and mobilizing perform supine to sit  edge of bed transfer using hospital bed feature supervision.  Demonstrated good seating balance.  Patient removed nightgown and donned clothing with minimal assist and donned shirt with supervision.  Needed some more assistance for donning of pants.  Ambulates up to 82 feet rolling walker contact-guard assist working on left side inattention.  SLP follow-up participate in abstract card games patient required moderate assist to utilize verbalize instructions to participating again.  Patient with some increased frustration during sessions.  Patient shared broad activities completed in prior sessions and recalled to date with use of calendar.  Full family teaching completed plan discharge to home       Disposition:  Discharge disposition: 06-Home-Health Care Svc        Diet: Regular  Special Instructions: No driving smoking or alcohol  Medications at discharge. 1.  Tylenol  as needed 2.  Ativan 0.25 mg twice daily as needed anxiety 3.  Norvasc 10 mg p.o. daily 4.  Eliquis 5 mg p.o. twice daily 5.  Lipitor 20 mg p.o. daily 6.  Synthroid  100 mcg p.o. daily 7.  Namenda  5 mg p.o. twice daily 8.  Robaxin  500 mg every 8 hours as needed muscle spasms 9.  Remeron  15 mg p.o. nightly 10.  Prilosec 40 mg daily 11.  MiraLAX  daily hold for loose stools 12.  Lexapro 10 mg daily 13.  Calcium  vitamin D 1 tablet daily 14.  Zyrtec 10 mg daily 15.  Multivitamin daily 16.  Multivitamin-minerals (ICaps) 1 capsule twice daily 17.  Sinequan 10 mg nightly 18.  Avapro  150 mg daily  Discharge Instructions     Ambulatory referral to Neurology   Complete by: As directed    An appointment is requested in approximately: 4 weeks right PCA infarction   Ambulatory referral to Physical Medicine Rehab   Complete by: As directed    Moderate complexity follow-up 1 to 2 weeks right PCA infarction        Follow-up Information     Urbano Albright, MD Follow up.   Specialty: Physical Medicine and Rehabilitation Why: Office to call for appointment Contact information: [address] [place] [address] [phone]         Corlis Pagan, NP Follow up.   Why: Call for appointment Contact information: [address] [place] [address] [phone]         Lavona Agent, MD Follow up.   Specialty: Cardiology Why: Call for appointment Contact information: [address] [place] [address] [phone]                 Signed: Toribio JINNY Pitch 06/15/2024, 4:43 AM

## 2024-06-03 NOTE — Evaluation (Addendum)
 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 (HCC)   Past Medical History:  Past Medical History:  Diagnosis Date   Acid reflux    Arthritis    Carotid artery disease    50-70% left internal carotid artery stenosis   Depression    Dyspnea    Hypothyroidism    Macular degeneration    Thyroid  condition    Visual changes    Past Surgical History:  Past Surgical History:  Procedure Laterality Date   CATARACT EXTRACTION, BILATERAL     CHOLECYSTECTOMY     COLONOSCOPY     ESOPHAGOGASTRODUODENOSCOPY ENDOSCOPY     IR ANGIO VERTEBRAL SEL SUBCLAVIAN INNOMINATE UNI R MOD SED  05/24/2024   IR PERCUTANEOUS ART THROMBECTOMY/INFUSION INTRACRANIAL INC DIAG ANGIO  05/24/2024   IR US  GUIDE VASC ACCESS RIGHT  05/24/2024   RADIOLOGY WITH ANESTHESIA N/A 05/24/2024   Procedure: RADIOLOGY WITH ANESTHESIA;  Surgeon: Radiologist, Medication, MD;  Location: [facility] OR;  Service: Radiology;  Laterality: N/A;   TOTAL HIP ARTHROPLASTY Left 01/26/2024   Procedure: ARTHROPLASTY, HIP, TOTAL, ANTERIOR APPROACH;  Surgeon: Ernie Cough, MD;  Location: [facility];  Service: Orthopedics;  Laterality: Left;   TOTAL KNEE ARTHROPLASTY Left 06/26/2020   Procedure: TOTAL KNEE ARTHROPLASTY;  Surgeon: Ernie Cough, MD;  Location: [facility];  Service: Orthopedics;  Laterality: Left;  70 mins    Assessment & Plan Clinical Impression: Patient is a 88 y.o. right-handed female with history of depression, hypothyroidism, left ICA stenosis 50-70%, hyperlipidemia, memory  deficits /vascular dementia maintained on Namenda , GERD, macular degeneration. Per chart review patient lives with her children. 1 level home 6 steps to entry. Independent prior to admission however she will use a rollator at times within the community. Presented 05/24/2024 with acute onset of left-sided weakness right side gaze and left hemianopsia. Blood pressure elevated 190s systolic in the ED. Cranial CT scan showed stable chronic small vessel disease. CTA showed right PCA P1 segment occlusion bilateral proximal subclavian artery and vertebral artery origin atherosclerosis without hemodynamically significant stenosis. Right ICA origin plaque with 60% stenosis. Bilateral cervical ICA tortuosity and possible fibromuscular dysplasia. LICA siphon moderate supraclinoid stenosis. Patient did receive TNK. Underwent cerebral arteriogram attempted unsuccessful thrombectomy 05/24/2024 per Dr. Nancyann Burns. CTA angio of the head follow-up showed early right PCA territory cytotoxic edema without hemorrhagic transformation or mass effect. A follow-up MRI was completed showing large acute right PCA infarct with additional scattered small acute bilateral cerebral and cerebellar infarcts. Moderate cerebral atrophy with mild chronic small vessel ischemic disease with chronic lacunar infarct. Admission chemistries unremarkable except creatinine 1.28. Echocardiogram ejection fraction of 60 to 65% no wall motion abnormalities. Initially placed on aspirin  and Plavix for CVA prophylaxis. Hospital course cardiology services consulted 10/23 for evaluation of atrial flutter and patient ultimately has been placed on Eliquis for both CVA prophylaxis and a flutter and aspirin  and Plavix discontinued. Palliative care consulted to establish goals of care. Her diet has been advanced to a regular consistency.   Patient currently requires max with basic self-care skills secondary to muscle weakness, decreased cardiorespiratoy endurance,  impaired timing and sequencing, abnormal tone, ataxia, decreased coordination, and decreased motor planning, decreased visual perceptual skills and hemianopsia, decreased attention to left, and decreased standing balance, decreased postural control, decreased balance strategies, and difficulty maintaining precautions.  Prior to hospitalization, patient could complete ADLS with sup to mod I.  Patient will benefit from skilled intervention to decrease level of assist with basic self-care skills and increase independence with basic self-care skills prior to discharge home with care partner.  Anticipate patient will require 24 hour supervision and intermittent supervision and follow up home health.  OT - End of Session Activity Tolerance: Tolerates 30+ min activity with multiple rests Endurance Deficit: Yes Endurance Deficit Description: required frequent rest breaks OT Assessment Rehab Potential (ACUTE ONLY): Good OT Barriers to Discharge: Decreased caregiver support OT Patient demonstrates impairments in the following area(s): Balance;Behavior;Cognition;Safety;Endurance;Motor OT Basic ADL's Functional Problem(s): Grooming;Bathing;Dressing;Toileting OT Transfers Functional Problem(s): Toilet OT Additional Impairment(s): Fuctional Use of Upper Extremity OT Plan OT Intensity: Minimum of 1-2 x/day, 45 to 90 minutes OT Frequency: 5 out of 7 days OT Duration/Estimated Length of Stay: 14-17 OT Treatment/Interventions: Visual/perceptual remediation/compensation;Therapeutic Activities;Psychosocial support;Functional mobility training;Discharge planning;UE/LE Coordination activities;Patient/family education;Functional electrical stimulation;Community reintegration;UE/LE Strength taining/ROM;Pain management;DME/adaptive equipment instruction;Cognitive remediation/compensation;Wheelchair propulsion/positioning;Therapeutic Exercise;Self Care/advanced ADL retraining;Neuromuscular re-education;Disease  mangement/prevention;Balance/vestibular training OT Self Feeding Anticipated Outcome(s): mod I OT Basic Self-Care Anticipated Outcome(s): CGA OT Toileting Anticipated Outcome(s): CGA OT Bathroom Transfers Anticipated Outcome(s): CGA OT Recommendation Recommendations for Other Services: None Patient destination: Home Follow Up Recommendations: Home health OT Equipment Recommended: 3 in 1 bedside comode;Rolling walker with 5 wheels   OT Evaluation Precautions/Restrictions  Precautions Precautions: Fall Recall of Precautions/Restrictions: Impaired Precaution/Restrictions Comments: L NEGLECT Restrictions Weight Bearing Restrictions Per Provider Order: No General Chart Reviewed: Yes Response to Previous Treatment: Not applicable Family/Caregiver Present: No Vital Signs Therapy Vitals Temp: 98 F (36.7 C) Temp Source: Oral Pulse Rate: 67 Resp: 17 BP: (!) 145/66 Patient Position (if appropriate): Lying Oxygen Therapy SpO2: 95 % O2 Device: Room Air Pain   Home Living/Prior Functioning Home Living Family/patient expects to be discharged to:: Unsure Living Arrangements: Children Available Help at Discharge: Family Type of Home: House Home Access: Stairs to enter Secretary/administrator of Steps: 6 Entrance Stairs-Rails: Right, Left, Can reach both Home Layout: One level Bathroom Shower/Tub: Engineer, Manufacturing Systems: Handicapped height Bathroom Accessibility: Yes  Lives With: Daughter IADL History Current License: No Prior Function Level of Independence: Independent with basic ADLs  Able to Take Stairs?: Yes Vision Baseline Vision/History: 1 Wears glasses Ability to See in Adequate Light: 1 Impaired Vision Assessment?: Vision impaired- to be further tested in functional context Additional Comments: trouble tracking in Upper quadrants, periphrial deficits on L Perception  Perception: Impaired Perception-Other Comments: L Praxis    Cognition Cognition Overall Cognitive Status: Within Functional Limits for tasks assessed Arousal/Alertness: Awake/alert Brief Interview for Mental Status (BIMS) Repetition of Three Words (First Attempt): 3 Temporal Orientation: Year: Correct Temporal Orientation: Month: Accurate within 5 days Temporal Orientation: Day: Correct Recall: Sock: Yes, no cue required Recall: Blue: Yes, no cue required Recall: Bed: Yes, no cue required BIMS Summary Score: 15 Sensation Sensation Light Touch: Appears Intact Coordination Gross Motor Movements are Fluid and Coordinated: No Fine Motor Movements are Fluid and Coordinated: No Coordination and Movement Description: undershooting 9 Hole Peg Test: to be completed Motor  Motor Motor: Ataxia;Motor apraxia  Trunk/Postural Assessment  Cervical Assessment Cervical Assessment: Exceptions to [facility] (forward head) Thoracic Assessment Thoracic Assessment: Exceptions to [facility] (rounded shoulders) Lumbar Assessment Lumbar  Assessment: Exceptions to [facility] (posterior tilt) Postural Control Postural Control: Deficits on evaluation  Balance Balance Balance Assessed: Yes Static Sitting Balance Static Sitting - Balance Support: Bilateral upper extremity supported;Feet supported Static Sitting - Level of Assistance: 6: Modified independent (Device/Increase time) Dynamic Sitting Balance Dynamic Sitting - Balance Support: Feet supported Dynamic Sitting - Level of Assistance: 4: Min assist Static Standing Balance Static Standing - Balance Support: During functional activity Static Standing - Level of Assistance: 4: Min assist Dynamic Standing Balance Dynamic Standing - Balance Support: Left upper extremity supported;Right upper extremity supported Dynamic Standing - Level of Assistance: 2: Max assist Dynamic Standing - Balance Activities: Reaching for objects;Forward lean/weight shifting Extremity/Trunk Assessment RUE Assessment RUE Assessment: Within  Functional Limits Active Range of Motion (AROM) Comments: WFL LUE Assessment LUE Assessment: Exceptions to [facility] Active Range of Motion (AROM) Comments: WFL General Strength Comments: 5/5- decreased coordination  Care Tool Care Tool Self Care Eating   Eating Assist Level: Set up assist    Oral Care    Oral Care Assist Level: Set up assist    Bathing   Body parts bathed by patient: Right arm;Left arm;Chest;Front perineal area;Abdomen;Right upper leg;Left upper leg;Face Body parts bathed by helper: Buttocks;Right lower leg;Left lower leg   Assist Level: Maximal Assistance - Patient 24 - 49%    Upper Body Dressing(including orthotics)   What is the patient wearing?: Pull over shirt   Assist Level: Contact Guard/Touching assist    Lower Body Dressing (excluding footwear)   What is the patient wearing?: Underwear/pull up;Pants Assist for lower body dressing: Maximal Assistance - Patient 25 - 49%    Putting on/Taking off footwear   What is the patient wearing?: Socks Assist for footwear: Maximal Assistance - Patient 25 - 49%       Care Tool Toileting Toileting activity   Assist for toileting: Maximal Assistance - Patient 25 - 49%     Care Tool Bed Mobility Roll left and right activity   Roll left and right assist level: Moderate Assistance - Patient 50 - 74%    Sit to lying activity   Sit to lying assist level: Moderate Assistance - Patient 50 - 74%    Lying to sitting on side of bed activity   Lying to sitting on side of bed assist level: the ability to move from lying on the back to sitting on the side of the bed with no back support.: Moderate Assistance - Patient 50 - 74%     Care Tool Transfers Sit to stand transfer   Sit to stand assist level: Moderate Assistance - Patient 50 - 74%    Chair/bed transfer   Chair/bed transfer assist level: Moderate Assistance - Patient 50 - 74%     Toilet transfer   Assist Level: Moderate Assistance - Patient 50 - 74%      Care Tool Cognition  Expression of Ideas and Wants Expression of Ideas and Wants: 4. Without difficulty (complex and basic) - expresses complex messages without difficulty and with speech that is clear and easy to understand  Understanding Verbal and Non-Verbal Content Understanding Verbal and Non-Verbal Content: 4. Understands (complex and basic) - clear comprehension without cues or repetitions   Memory/Recall Ability Memory/Recall Ability : Location of own room;Current season;Staff names and faces;That he or she is in a hospital/hospital unit   Refer to Care Plan for Long Term Goals  SHORT TERM GOAL WEEK 1 OT Short Term Goal 1 (Week 1): patient will complete toileting with  mod A OT Short Term Goal 2 (Week 1): patient will complete LB dressing with mod A OT Short Term Goal 3 (Week 1): patient will complete standing during functional activities with min A for 45+ seconds  Recommendations for other services: None    Skilled Therapeutic Intervention   Evaluation completed (see details above) with education on OT POC and goals and individual treatment initiated with focus on functional mobility/transfers, ADL re-training,  generalized strengthening and endurance, dynamic standing balance/coordination, pt/family education and discharge planning. Pt education provided on therapy schedule and safety policy with use of chair alarm. Pt participated in goal setting. Pt participated in modified ADL task (See above for functional performance).     ADL ADL Equipment Provided: Reacher Eating: Set up Where Assessed-Eating: Wheelchair Grooming: Setup Where Assessed-Grooming: Sitting at sink Upper Body Bathing: Supervision/safety Where Assessed-Upper Body Bathing: Sitting at sink Lower Body Bathing: Maximal assistance Where Assessed-Lower Body Bathing: Standing at sink;Sitting at sink Upper Body Dressing: Maximal assistance Where Assessed-Upper Body Dressing: Wheelchair Lower Body Dressing:  Maximal assistance Where Assessed-Lower Body Dressing: Wheelchair Toileting: Maximal assistance Where Assessed-Toileting: Teacher, Adult Education: Moderate assistance Toilet Transfer Method: Stand pivot Toilet Transfer Equipment: Grab bars;Raised toilet seat Film/video Editor: Maximal assistance Film/video Editor Method: Warden/ranger: Shower seat with back Mobility  Bed Mobility Bed Mobility: Supine to Sit;Sit to Supine;Rolling Right Rolling Right: Contact Guard/Touching assist Supine to Sit: Moderate Assistance - Patient 50-74% Sit to Supine: Moderate Assistance - Patient 50-74% Transfers Sit to Stand: Moderate Assistance - Patient 50-74% Stand to Sit: Moderate Assistance - Patient 50-74%   Discharge Criteria: Patient will be discharged from OT if patient refuses treatment 3 consecutive times without medical reason, if treatment goals not met, if there is a change in medical status, if patient makes no progress towards goals or if patient is discharged from hospital.  The above assessment, treatment plan, treatment alternatives and goals were discussed and mutually agreed upon: by patient  D'mariea L Myonna Chisom 06/03/2024, 10:40 AM

## 2024-06-03 NOTE — Progress Notes (Signed)
 Inpatient Rehabilitation  Patient information reviewed and entered into eRehab system by [facility]. Karen Kays., CCC/SLP, PPS Coordinator.  Information including medical coding, functional ability and quality indicators will be reviewed and updated through discharge.

## 2024-06-03 NOTE — Discharge Instructions (Addendum)
 Inpatient Rehab Discharge Instructions  Donna Petty Discharge date and time: No discharge date for patient encounter.   Activities/Precautions/ Functional Status: Activity: activity as tolerated Diet: regular diet Wound Care: Routine skin checks Functional status:  ___ No restrictions     ___ Walk up steps independently ___ 24/7 supervision/assistance   ___ Walk up steps with assistance ___ Intermittent supervision/assistance  ___ Bathe/dress independently ___ Walk with walker     _x__ Bathe/dress with assistance ___ Walk Independently    ___ Shower independently ___ Walk with assistance    ___ Shower with assistance ___ No alcohol     ___ Return to work/school ________  Special Instructions:  COMMUNITY REFERRALS UPON DISCHARGE:    Home Health:   PT     OT                    Agency: [facility] Phone: [phone]   Medical Equipment/Items Ordered: 3-1 commode                                                  Agency/Supplier: [employer]  No driving smoking or alcoholSTROKE/TIA DISCHARGE INSTRUCTIONS SMOKING Cigarette smoking nearly doubles your risk of having a stroke & is the single most alterable risk factor  If you smoke or have smoked in the last 12 months, you are advised to quit smoking for your health. Most of the excess cardiovascular risk related to smoking disappears within a year of stopping. Ask you doctor about anti-smoking medications [place] Quit Line: 1-800-QUIT NOW Free Smoking Cessation Classes [phone]  CHOLESTEROL Know your levels; limit fat & cholesterol in your diet  Lipid Panel     Component Value Date/Time   CHOL 168 05/25/2024 0308   TRIG 167 (H) 05/25/2024 0308   HDL 40 (L) 05/25/2024 0308   CHOLHDL 4.2 05/25/2024 0308   VLDL 33 05/25/2024 0308   LDLCALC 95 05/25/2024 0308     Many patients benefit from treatment even if their cholesterol is at goal. Goal: Total Cholesterol (CHOL) less than 160 Goal:  Triglycerides (TRIG) less than 150 Goal:  HDL  greater than 40 Goal:  LDL (LDLCALC) less than 100   BLOOD PRESSURE American Stroke Association blood pressure target is less that 120/80 mm/Hg  Your discharge blood pressure is:  BP: (!) 158/70 Monitor your blood pressure Limit your salt and alcohol intake Many individuals will require more than one medication for high blood pressure  DIABETES (A1c is a blood sugar average for last 3 months) Goal HGBA1c is under 7% (HBGA1c is blood sugar average for last 3 months)  Diabetes: No known diagnosis of diabetes    Lab Results  Component Value Date   HGBA1C 5.4 05/24/2024    Your HGBA1c can be lowered with medications, healthy diet, and exercise. Check your blood sugar as directed by your physician Call your physician if you experience unexplained or low blood sugars.  PHYSICAL ACTIVITY/REHABILITATION Goal is 30 minutes at least 4 days per week  Activity: Increase activity slowly, Therapies: Physical Therapy: Home Health Return to work:  Activity decreases your risk of heart attack and stroke and makes your heart stronger.  It helps control your weight and blood pressure; helps you relax and can improve your mood. Participate in a regular exercise program. Talk with your doctor about the best form of  exercise for you (dancing, walking, swimming, cycling).  DIET/WEIGHT Goal is to maintain a healthy weight  Your discharge diet is:  Diet Order             Diet Heart Room service appropriate? Yes with Assist; Fluid consistency: Thin  Diet effective now                   liquids Your height is:  Height: 5' 1 (154.9 cm) Your current weight is: Weight: 74.3 kg Your Body Mass Index (BMI) is:  BMI (Calculated): 30.97 Following the type of diet specifically designed for you will help prevent another stroke. Your goal weight range is:   Your goal Body Mass Index (BMI) is 19-24. Healthy food habits can help reduce 3 risk factors for stroke:  High cholesterol, hypertension, and excess weight.   RESOURCES Stroke/Support Group:  Call [phone]   STROKE EDUCATION PROVIDED/REVIEWED AND GIVEN TO PATIENT Stroke warning signs and symptoms How to activate emergency medical system (call 911). Medications prescribed at discharge. Need for follow-up after discharge. Personal risk factors for stroke. Pneumonia vaccine given: No Flu vaccine given: No My questions have been answered, the writing is legible, and I understand these instructions.  I will adhere to these goals & educational materials that have been provided to me after my discharge from the hospital.     My questions have been answered and I understand these instructions. I will adhere to these goals and the provided educational materials after my discharge from the hospital.  Patient/Caregiver Signature _______________________________ Date __________  Clinician Signature _______________________________________ Date __________  Please bring this form and your medication list with you to all your follow-up doctor's appointments.     __________________________________________________________________________________________________________________________________________________  Information on my medicine - ELIQUIS (apixaban)  This medication education was reviewed with me or my healthcare representative as part of my discharge preparation.   Why was Eliquis prescribed for you? Eliquis was prescribed for you to reduce the risk of a blood clot forming that can cause a stroke if you have a medical condition called atrial fibrillation (a type of irregular heartbeat).  What do You need to know about Eliquis ? Take your Eliquis TWICE DAILY - one tablet in the morning and one tablet in the evening with or without food. If you have difficulty swallowing the tablet whole please discuss with your pharmacist how to take the medication safely.  Take Eliquis exactly as prescribed by your doctor and DO NOT stop taking Eliquis  without talking to the doctor who prescribed the medication.  Stopping may increase your risk of developing a stroke.  Refill your prescription before you run out.  After discharge, you should have regular check-up appointments with your healthcare provider that is prescribing your Eliquis.  In the future your dose may need to be changed if your kidney function or weight changes by a significant amount or as you get older.  What do you do if you miss a dose? If you miss a dose, take it as soon as you remember on the same day and resume taking twice daily.  Do not take more than one dose of ELIQUIS at the same time to make up a missed dose.  Important Safety Information A possible side effect of Eliquis is bleeding. You should call your healthcare provider right away if you experience any of the following: Bleeding from an injury or your nose that does not stop. Unusual colored urine (red or dark brown) or unusual colored  stools (red or black). Unusual bruising for unknown reasons. A serious fall or if you hit your head (even if there is no bleeding).  Some medicines may interact with Eliquis and might increase your risk of bleeding or clotting while on Eliquis. To help avoid this, consult your healthcare provider or pharmacist prior to using any new prescription or non-prescription medications, including herbals, vitamins, non-steroidal anti-inflammatory drugs (NSAIDs) and supplements.  This website has more information on Eliquis (apixaban): http://[email or web address].com/eliquis/home

## 2024-06-04 DIAGNOSIS — F32A Depression, unspecified: Secondary | ICD-10-CM | POA: Diagnosis not present

## 2024-06-04 DIAGNOSIS — I639 Cerebral infarction, unspecified: Secondary | ICD-10-CM

## 2024-06-04 DIAGNOSIS — I10 Essential (primary) hypertension: Secondary | ICD-10-CM | POA: Diagnosis not present

## 2024-06-04 DIAGNOSIS — I63531 Cerebral infarction due to unspecified occlusion or stenosis of right posterior cerebral artery: Secondary | ICD-10-CM | POA: Diagnosis not present

## 2024-06-04 DIAGNOSIS — F4321 Adjustment disorder with depressed mood: Secondary | ICD-10-CM | POA: Diagnosis present

## 2024-06-04 DIAGNOSIS — I4892 Unspecified atrial flutter: Secondary | ICD-10-CM | POA: Diagnosis not present

## 2024-06-04 MED ORDER — QUETIAPINE FUMARATE 25 MG PO TABS
12.5000 mg | ORAL_TABLET | Freq: Every evening | ORAL | Status: DC | PRN
  Administered 2024-06-04 – 2024-06-06 (×3): 12.5 mg via ORAL
  Filled 2024-06-04 (×4): qty 1

## 2024-06-04 MED ORDER — AMLODIPINE BESYLATE 10 MG PO TABS
10.0000 mg | ORAL_TABLET | Freq: Every day | ORAL | Status: DC
  Administered 2024-06-05 – 2024-06-15 (×11): 10 mg via ORAL
  Filled 2024-06-04 (×11): qty 1

## 2024-06-04 MED ORDER — ESCITALOPRAM OXALATE 10 MG PO TABS
10.0000 mg | ORAL_TABLET | Freq: Every day | ORAL | Status: DC
  Administered 2024-06-04 – 2024-06-15 (×12): 10 mg via ORAL
  Filled 2024-06-04 (×12): qty 1

## 2024-06-04 MED ORDER — LORAZEPAM 0.5 MG PO TABS: 0.2500 mg | ORAL_TABLET | Freq: Every day | ORAL | Status: DC | PRN

## 2024-06-04 MED ORDER — CALCIUM CARBONATE ANTACID 500 MG PO CHEW
1.0000 | CHEWABLE_TABLET | Freq: Three times a day (TID) | ORAL | Status: DC | PRN
  Administered 2024-06-04 – 2024-06-14 (×12): 200 mg via ORAL
  Filled 2024-06-04 (×14): qty 1

## 2024-06-04 NOTE — Consult Note (Signed)
 [facility] Psychiatric Consult Initial  Patient Name: .Donna Petty  MRN: [id number]  DOB: July 21, 1936  Consult Order details:  Orders (From admission, onward)     Start     Ordered   06/04/24 1256  IP CONSULT TO PSYCHIATRY       Ordering Provider: Urbano Albright, MD  Provider:  (Not yet assigned)  Question Answer Comment  Location [facility]   Reason for Consult? depression      06/04/24 1256             Mode of Visit: In person    Psychiatry Consult Evaluation  Service Date: June 04, 2024 LOS:  LOS: 2 days  Chief Complaint per her daughter, she is depressed because she wants to go home which the patient confirmed.  She lives with her daughter who is her caregiver.  Primary Psychiatric Diagnoses  Adjustment disorder with depressed mood  Assessment  Donna Petty is a 88 y.o. female admitted: Medicallyfor 06/02/2024  4:08 PM for CVA with a medical history per Dr Cornelio of right-handed female with history of depression, hypothyroidism, left ICA stenosis 50-70%, hyperlipidemia, memory deficits /vascular dementia maintained on Namenda , GERD, macular degeneration. She carries the psychiatric diagnoses of insomnia.  Her current presentation of dysphoric mood since her CVA on 10/21, low energy and motivation is most consistent with adjustment disorder with depressed mood.  Current outpatient psychotropic medications include mirtazapine  15 mg for sleep and anxiety and historically she has had a positive response to these medications. She was compliant with medications prior to admission as evidenced by daughter's report. On initial examination, patient was staring at times, despondent, with minimal engagement. Please see plan below for detailed recommendations.   Diagnoses:  Active Hospital problems: Principal Problem:   Acute ischemic right posterior cerebral artery (PCA) stroke (HCC) Active Problems:   Adjustment disorder with depressed mood    Plan    ## Psychiatric Medication Recommendations:  --Continue mirtazapine  15 mg at bedtime (home medication) --Changed Seroquel 12.5 mg at bedtime to PRN --Changed Xanax 0.25 mg BID PRN to Ativan 0.25 mg daily PRN --Continue Lexapro 10 mg that was started  ## Medical Decision Making Capacity: Not specifically addressed in this encounter  ## Further Work-up:  -- most recent EKG on 05/26/2024 had QtC of 449 -- Pertinent labwork reviewed earlier this admission includes: CBC with diff, toxicology, chem panel, EKG   ## Disposition:-- There are no psychiatric contraindications to discharge at this time  ## Behavioral / Environmental: - No specific recommendations at this time.     ## Safety and Observation Level:  - Based on my clinical evaluation, I estimate the patient to be at minimal risk of self harm in the current setting. - At this time, we recommend  routine. This decision is based on my review of the chart including patient's history and current presentation, interview of the patient, mental status examination, and consideration of suicide risk including evaluating suicidal ideation, plan, intent, suicidal or self-harm behaviors, risk factors, and protective factors. This judgment is based on our ability to directly address suicide risk, implement suicide prevention strategies, and develop a safety plan while the patient is in the clinical setting. Please contact our team if there is a concern that risk level has changed.  CSSR Risk Category:C-SSRS RISK CATEGORY: Low Risk  Suicide Risk Assessment: Patient has following modifiable risk factors for suicide: triggering events, of a CVA which we are addressing by PT via medical team. Patient  has following non-modifiable or demographic risk factors for suicide: none Patient has the following protective factors against suicide: Supportive family, Frustration tolerance, no history of suicide attempts, and no history of NSSIB  Thank you for this  consult request. Recommendations have been communicated to the primary team.  We will sign off at this time.   Donna Becker, NP       History of Present Illness  Relevant Aspects of [name] Course:  Admitted on 06/02/2024  for CVA with a medical history per Dr Cornelio of right-handed female with history of depression, hypothyroidism, left ICA stenosis 50-70%, hyperlipidemia, memory deficits /vascular dementia maintained on Namenda , GERD, macular degeneration. She carries the psychiatric diagnoses of insomnia.  Patient Report:  88 yo admitted to rehab after a CVA on 10/21 that caused a deficit in her ambulation.  Her daughter, Donna Petty, is at her bedside and supplied most of the information with her mother confirming.  The daughter, Donna Petty, reported she was depressed because she wants to be home and she has to be able to walk again as she cannot lift her.  She lives with her and her husband who helps with care also.  No suicidal ideations.  Guns are locked in the home with patient not having access (secured) along withshe doesn't like guns.  Appetite is fair, decreased with her aging. Her sleep is ok, always been a light sleeper, the mirtazapine  was helpful at home per the daughter for sleep and anxiety.  She does not feel her anxiety is an issue at this time, no panic attacks.  Her daughter's main concern was her receiving the PT regularly to get her back to her prior baseline before the stroke.  Confirmed with staff that PT is once per weekend and let the daughter know.  Discussed the Lexapro and side effects, medication adjustments made.  Caveat:  The family is having difficulty with her processing that she was fine and then had the stroke sitting on the couch at home, tells them they are lying about it at times.  The daughter has to reinforce the need for therapy which this provider also did as her goal is to walk so she can return home again.  Yesterday, she was confused with PT  and sent them out of the room.    Psych ROS:  Depression: present Anxiety:  none Mania (lifetime and current): none Psychosis: (lifetime and current): none  Collateral information:  Contacted daughter at her bedside on 06/04/2024 with her providing most of the history with the patient confirming.  Review of Systems  Constitutional:  Positive for malaise/fatigue.  HENT: Negative.    Eyes: Negative.   Respiratory: Negative.    Cardiovascular: Negative.   Gastrointestinal: Negative.   Musculoskeletal: Negative.   Skin: Negative.   Neurological: Negative.   Endo/Heme/Allergies: Negative.   Psychiatric/Behavioral:  Positive for depression and memory loss.      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 Vital  Signs:  Temp:  [97.9 F (36.6 C)-98.1 F (36.7 C)] 98.1 F (36.7 C) (11/01 1321) Pulse Rate:  [69-75] 69 (11/01 1321) Resp:  [16-18] 16 (11/01 1321) BP: (153-159)/(61-78) 159/64 (11/01 1321) SpO2:  [94 %-95 %] 95 % (11/01 1321) Blood pressure (!) 159/64, pulse 69, temperature 98.1 F (36.7 C), temperature source Oral, resp. rate 16, height 5' 1 (1.549 m), weight 74.3 kg, SpO2 95%. Body mass index is 30.95 kg/m.  Physical Exam Vitals and nursing note reviewed.  Constitutional:      Appearance: Normal appearance.  HENT:     Head: Normocephalic.  Pulmonary:     Effort:  Pulmonary effort is normal.  Musculoskeletal:     Cervical back: Normal range of motion.  Neurological:     Mental Status: She is alert.     Mental Status Exam: General Appearance: Casual  Orientation:  Full (Time, Place, and Person)  Memory:  Immediate;   Fair Recent;   Fair Remote;   Fair  Concentration:  Concentration: Fair and Attention Span: Fair  Recall:  Poor  Attention  Fair  Eye Contact:  Poor  Speech:  Slow  Language:  Fair  Volume:  Decreased  Mood: depressed  Affect:  Congruent  Thought Process:  Coherent  Thought Content:  Rumination on going home  Suicidal Thoughts:  No  Homicidal Thoughts:  No  Judgement:  Fair  Insight:  Fair  Psychomotor Activity:  Decreased  Akathisia:  No  Fund of Knowledge:  Good      Assets:  Housing Leisure Time Resilience Social Support  Cognition:  memory deficits  ADL's:  Impaired  AIMS (if indicated):        Other History   These have been pulled in through the EMR, reviewed, and updated if appropriate.  Family History:  The patient's family history includes Hypertension in her father.  Medical History: Past Medical History:  Diagnosis Date   Acid reflux    Arthritis    Carotid artery disease    50-70% left internal carotid artery stenosis   Depression    Dyspnea    Hypothyroidism    Macular degeneration    Thyroid  condition    Visual changes     Surgical History: Past Surgical History:  Procedure Laterality Date   CATARACT EXTRACTION, BILATERAL     CHOLECYSTECTOMY     COLONOSCOPY     ESOPHAGOGASTRODUODENOSCOPY ENDOSCOPY     IR ANGIO VERTEBRAL SEL SUBCLAVIAN INNOMINATE UNI R MOD SED  05/24/2024   IR PERCUTANEOUS ART THROMBECTOMY/INFUSION INTRACRANIAL INC DIAG ANGIO  05/24/2024   IR US  GUIDE VASC ACCESS RIGHT  05/24/2024   RADIOLOGY WITH ANESTHESIA N/A 05/24/2024   Procedure: RADIOLOGY WITH ANESTHESIA;  Surgeon: Radiologist, Medication, MD;  Location: [facility] OR;  Service: Radiology;  Laterality: N/A;    TOTAL HIP ARTHROPLASTY Left 01/26/2024   Procedure: ARTHROPLASTY, HIP, TOTAL, ANTERIOR APPROACH;  Surgeon: Ernie Cough, MD;  Location: [facility];  Service: Orthopedics;  Laterality: Left;   TOTAL KNEE ARTHROPLASTY Left 06/26/2020   Procedure: TOTAL KNEE ARTHROPLASTY;  Surgeon: Ernie Cough, MD;  Location: [facility];  Service: Orthopedics;  Laterality: Left;  70 mins     Medications:   Current Facility-Administered Medications:    acetaminophen  (TYLENOL ) tablet 650 mg, 650 mg, Oral, Q4H PRN, 650 mg at 06/02/24 2210 **OR** acetaminophen  (TYLENOL ) 160 MG/5ML solution 650 mg, 650 mg, Per Tube, Q4H PRN **OR** acetaminophen  (TYLENOL ) suppository 650 mg, 650 mg, Rectal, Q4H PRN, Angiulli, Daniel J, PA-C  ALPRAZolam (XANAX) tablet 0.25 mg, 0.25 mg, Oral, BID PRN, Angiulli, Daniel J, PA-C, 0.25 mg at 06/02/24 2209   [START ON 06/05/2024] amLODipine (NORVASC) tablet 10 mg, 10 mg, Oral, Daily, Urbano Albright, MD   apixaban WINN) tablet 5 mg, 5 mg, Oral, BID, Angiulli, Daniel J, PA-C, 5 mg at 06/04/24 0849   atorvastatin (LIPITOR) tablet 20 mg, 20 mg, Oral, Daily, Angiulli, Daniel J, PA-C, 20 mg at 06/04/24 0849   escitalopram (LEXAPRO) tablet 10 mg, 10 mg, Oral, Daily, Urbano Albright, MD, 10 mg at 06/04/24 1329   feeding supplement (ENSURE PLUS HIGH PROTEIN) liquid 237 mL, 237 mL, Oral, BID BM, Angiulli, Daniel J, PA-C, 237 mL at 06/04/24 1028   levothyroxine  (SYNTHROID ) tablet 100 mcg, 100 mcg, Oral, Q0600, Angiulli, Daniel J, PA-C, 100 mcg at 06/04/24 9387   memantine  (NAMENDA ) tablet 5 mg, 5 mg, Oral, BID, Angiulli, Daniel J, PA-C, 5 mg at 06/04/24 9150   methocarbamol  (ROBAXIN ) tablet 500 mg, 500 mg, Oral, Q8H PRN, Angiulli, Daniel J, PA-C, 500 mg at 06/02/24 2209   mirtazapine  (REMERON ) tablet 15 mg, 15 mg, Oral, QHS, Angiulli, Daniel J, PA-C, 15 mg at 06/03/24 2002   pantoprazole  (PROTONIX ) EC tablet 40 mg, 40 mg, Oral, QHS, Angiulli, Daniel J, PA-C, 40 mg at 06/03/24 2002   polyethylene  glycol (MIRALAX  / GLYCOLAX ) packet 17 g, 17 g, Oral, BID, Angiulli, Daniel J, PA-C, 17 g at 06/03/24 1031   QUEtiapine (SEROQUEL) tablet 12.5 mg, 12.5 mg, Oral, QHS PRN, Jacquetta Donna GRADE, NP   senna-docusate (Senokot-S) tablet 1 tablet, 1 tablet, Oral, QHS PRN, Angiulli, Toribio PARAS, PA-C  Allergies: No Known Allergies  Donna Jacquetta, NP

## 2024-06-04 NOTE — Progress Notes (Addendum)
 PROGRESS NOTE   Subjective/Complaints: Laying in bed, no new complaints. Doesn't like hospital food. Wants to go home soon.   ROS: Patient denies fever, new vision changes, dizziness, nausea, vomiting, diarrhea,  shortness of breath or chest pain, headache + depressed mood- denies SI or HI   Objective:   No results found. No results for input(s): WBC, HGB, HCT, PLT in the last 72 hours.  No results for input(s): NA, K, CL, CO2, GLUCOSE, BUN, CREATININE, CALCIUM  in the last 72 hours.  No intake or output data in the 24 hours ending 06/04/24 1309       Physical Exam: Vital Signs Blood pressure (!) 153/78, pulse 75, temperature 97.9 F (36.6 C), resp. rate 18, height 5' 1 (1.549 m), weight 74.3 kg, SpO2 94%.  General: No apparent distress, sitting in WC, obese HEENT: Head is normocephalic, atraumatic, sclera anicteric, oral mucosa pink and moist Heart: Reg rate Rhythm irregular. . No murmurs rubs or gallops Chest: CTA bilaterally, non-labored Abdomen: Soft, non-tender, non-distended, bowel sounds positive. Extremities: No clubbing, cyanosis, or edema. Pulses are 2+ Psych: Very flat Skin: Many bruises throughout esp LUE distally and R inner thigh  Neuro:  alert and oriented x3, Right gaze preference but can look left.  L hemianopia  -had german measles as child affecting l eye and macular degeneration . Minimal verbal output. Follows simple commands.  MOTOR: RUE and RLE 5/5, other than Ankle DF 4/5- may be effort limited LUE/LLE- 5-/5 throughout  other than Ankle DF 4/5- may be effort limited  No hypertonia noted    Assessment/Plan: 1. Functional deficits which require 3+ hours per day of interdisciplinary therapy in a comprehensive inpatient rehab setting. Physiatrist is providing close team supervision and 24 hour management of active medical problems listed below. Physiatrist and rehab  team continue to assess barriers to discharge/monitor patient progress toward functional and medical goals  Care Tool:  Bathing    Body parts bathed by patient: Right arm, Left arm, Chest, Front perineal area, Abdomen, Right upper leg, Left upper leg, Face   Body parts bathed by helper: Buttocks, Right lower leg, Left lower leg     Bathing assist Assist Level: Maximal Assistance - Patient 24 - 49%     Upper Body Dressing/Undressing Upper body dressing   What is the patient wearing?: Pull over shirt    Upper body assist Assist Level: Contact Guard/Touching assist    Lower Body Dressing/Undressing Lower body dressing      What is the patient wearing?: Underwear/pull up, Pants     Lower body assist Assist for lower body dressing: Maximal Assistance - Patient 25 - 49%     Toileting Toileting    Toileting assist Assist for toileting: Maximal Assistance - Patient 25 - 49%     Transfers Chair/bed transfer  Transfers assist     Chair/bed transfer assist level: Moderate Assistance - Patient 50 - 74%     Locomotion Ambulation   Ambulation assist      Assist level: 2 helpers Assistive device: No Device Max distance: 19   Walk 10 feet activity   Assist     Assist level: 2 helpers Assistive device:  No Device   Walk 50 feet activity   Assist Walk 50 feet with 2 turns activity did not occur: Safety/medical concerns (weakness/fatigue/pain)         Walk 150 feet activity   Assist Walk 150 feet activity did not occur: Safety/medical concerns (weakness/fatigue/pain)         Walk 10 feet on uneven surface  activity   Assist Walk 10 feet on uneven surfaces activity did not occur: Safety/medical concerns (weakness/fatigue/pain)         Wheelchair     Assist Is the patient using a wheelchair?: Yes Type of Wheelchair: Manual Wheelchair activity did not occur: Refused         Wheelchair 50 feet with 2 turns activity    Assist     Wheelchair 50 feet with 2 turns activity did not occur: Refused       Wheelchair 150 feet activity     Assist  Wheelchair 150 feet activity did not occur: Refused       Blood pressure (!) 153/78, pulse 75, temperature 97.9 F (36.6 C), resp. rate 18, height 5' 1 (1.549 m), weight 74.3 kg, SpO2 94%.  Medical Problem List and Plan: 1. Functional deficits secondary to right PCA large infarct with multiple small scattered infarcts in bilateral hemispheres with R P1 occlusion status post IR but unsuccessful due to tortuosity of vessel             -patient may  shower             -ELOS/Goals: 14-16 days supervision of PT, OT and SLP             -Continue CIR  -Discussed do not think we would be ready for discharge at this time 2.  Antithrombotics: -DVT/anticoagulation:  Pharmaceutical: Eliquis for A flutter             -antiplatelet therapy: N/A 3. Pain Management: Robaxin  500 mg every 8 hours as needed 4. Mood/Behavior/Sleep: Namenda  5 mg twice daily, Remeron  15 mg nightly, Xanax 0.25 mg twice daily as needed             -antipsychotic agents: Seroquel 12.5 mg nightly  -Start lexapro 10mg  daily, psych consult for depression 5. Neuropsych/cognition: This patient is not capable of making decisions on her own behalf due to vascular dementia 6. Skin/Wound Care: Routine skin checks 7. Fluids/Electrolytes/Nutrition: Routine and analysis with follow-up chemistries 8.  Hypertension.  Norvasc 5 mg daily.  Monitor with increased mobility    06/04/2024    6:17 AM 06/03/2024    9:38 PM 06/03/2024    3:51 PM  Vitals with BMI  Systolic 153 158 827  Diastolic 78 61 71  Pulse  75 59  -11/1 increase to 10mg  amlodipine   9.  Hyperlipidemia.  Lipitor 10.  GERD.  Protonix  11.  Hypothyroidism.  Synthroid  100 mcg daily 12. Hx of german measles of L eye as child and macular degeneration- not clear why cannot see out of L eye- could be old vs hemianopia 13. Vascular dementia- on namenda  14.  Hx of L THA in 01/2024- and prior L TKA 15. Aflutter- on Eliquis- doesn't need rate control per Cards  -10/31-11/1 Hr stable overall 16. Insomnia- Con't Quetiapine 17. CKD, appears about CKD 3a at baseline  -monitor BMP      LOS: 2 days A FACE TO FACE EVALUATION WAS PERFORMED  Murray Collier 06/04/2024, 1:09 PM

## 2024-06-04 NOTE — Plan of Care (Signed)
  Problem: Consults Goal: RH STROKE PATIENT EDUCATION Description: See Patient Education module for education specifics  Outcome: Progressing Goal: Nutrition Consult-if indicated Outcome: Progressing   Problem: RH BOWEL ELIMINATION Goal: RH STG MANAGE BOWEL WITH ASSISTANCE Description: STG Manage Bowel with mod I Assistance. Outcome: Progressing Goal: RH STG MANAGE BOWEL W/MEDICATION W/ASSISTANCE Description: STG Manage Bowel with Medication with mod I Assistance. Outcome: Progressing   Problem: RH BLADDER ELIMINATION Goal: RH STG MANAGE BLADDER WITH ASSISTANCE Description: STG Manage Bladder With toileting Assistance Outcome: Progressing   Problem: RH SAFETY Goal: RH STG ADHERE TO SAFETY PRECAUTIONS W/ASSISTANCE/DEVICE Description: STG Adhere to Safety Precautions With cues Assistance/Device. Outcome: Progressing   Problem: RH PAIN MANAGEMENT Goal: RH STG PAIN MANAGED AT OR BELOW PT'S PAIN GOAL Description: Pain < 4 with prns Outcome: Progressing   Problem: RH KNOWLEDGE DEFICIT Goal: RH STG INCREASE KNOWLEDGE OF HYPERTENSION Description: Patient and dtr will be able to manage HTN using educational resources for medications and dietary modification independently Outcome: Progressing Goal: RH STG INCREASE KNOWLEGDE OF HYPERLIPIDEMIA Description: Patient and dtr will be able to manage HLD using educational resources for medications and dietary modification independently Outcome: Progressing Goal: RH STG INCREASE KNOWLEDGE OF STROKE PROPHYLAXIS Description: Patient and dtr will be able to manage secondary risks using educational resources for medications and dietary modification independently Outcome: Progressing   Problem: RH Vision Goal: RH LTG Vision (Specify) Outcome: Progressing

## 2024-06-05 DIAGNOSIS — I63531 Cerebral infarction due to unspecified occlusion or stenosis of right posterior cerebral artery: Secondary | ICD-10-CM | POA: Diagnosis not present

## 2024-06-05 DIAGNOSIS — N1831 Chronic kidney disease, stage 3a: Secondary | ICD-10-CM

## 2024-06-05 DIAGNOSIS — I4892 Unspecified atrial flutter: Secondary | ICD-10-CM | POA: Diagnosis not present

## 2024-06-05 DIAGNOSIS — I10 Essential (primary) hypertension: Secondary | ICD-10-CM | POA: Diagnosis not present

## 2024-06-05 DIAGNOSIS — F32A Depression, unspecified: Secondary | ICD-10-CM | POA: Diagnosis not present

## 2024-06-05 MED ORDER — HYDRALAZINE HCL 10 MG PO TABS
10.0000 mg | ORAL_TABLET | Freq: Four times a day (QID) | ORAL | Status: DC | PRN
Start: 2024-06-05 — End: 2024-06-15
  Administered 2024-06-11: 10 mg via ORAL
  Filled 2024-06-05 (×2): qty 1

## 2024-06-05 NOTE — Progress Notes (Addendum)
 PROGRESS NOTE   Subjective/Complaints: Reports some nausea today, improving. She says she doesn't like the food here.   ROS: Patient denies fever, new vision changes, dizziness,  vomiting, diarrhea,  shortness of breath or chest pain, headache + depressed mood- continued, denies SI or HI + nausea  Objective:   No results found. No results for input(s): WBC, HGB, HCT, PLT in the last 72 hours.  No results for input(s): NA, K, CL, CO2, GLUCOSE, BUN, CREATININE, CALCIUM  in the last 72 hours.   Intake/Output Summary (Last 24 hours) at 06/05/2024 1416 Last data filed at 06/05/2024 1307 Gross per 24 hour  Intake 237 ml  Output --  Net 237 ml         Physical Exam: Vital Signs Blood pressure (!) 170/90, pulse 84, temperature 97.8 F (36.6 C), temperature source Oral, resp. rate 16, height 5' 1 (1.549 m), weight 74.3 kg, SpO2 96%.  General: No apparent distress, laying in bed, obese, listening to gospel music HEENT: Head is normocephalic, atraumatic, sclera anicteric, oral mucosa pink and moist Heart: Reg rate Rhythm irregular. No murmurs rubs or gallops Chest: CTA bilaterally, non-labored Abdomen: Soft, non-tender, non-distended, bowel sounds positive. Extremities: No clubbing, cyanosis, or edema. Pulses are 2+ Psych: Very flat Skin: Many bruises throughout esp LUE distally and R inner thigh  Neuro:  alert and oriented x3, Right gaze preference but can look left.  L hemianopia  -had german measles as child affecting l eye and macular degeneration . Minimal verbal output. Follows simple commands.  MOTOR: RUE and RLE 5/5, other than Ankle DF 4/5- may be effort limited LUE/LLE- 5-/5 throughout  other than Ankle DF 4/5- may be effort limited  No hypertonia noted   Prior neuro assessment is c/w today's exam 06/05/2024.    Assessment/Plan: 1. Functional deficits which require 3+ hours per day of  interdisciplinary therapy in a comprehensive inpatient rehab setting. Physiatrist is providing close team supervision and 24 hour management of active medical problems listed below. Physiatrist and rehab team continue to assess barriers to discharge/monitor patient progress toward functional and medical goals  Care Tool:  Bathing    Body parts bathed by patient: Right arm, Left arm, Chest, Front perineal area, Abdomen, Right upper leg, Left upper leg, Face   Body parts bathed by helper: Buttocks, Right lower leg, Left lower leg     Bathing assist Assist Level: Maximal Assistance - Patient 24 - 49%     Upper Body Dressing/Undressing Upper body dressing   What is the patient wearing?: Pull over shirt    Upper body assist Assist Level: Contact Guard/Touching assist    Lower Body Dressing/Undressing Lower body dressing      What is the patient wearing?: Underwear/pull up, Pants     Lower body assist Assist for lower body dressing: Maximal Assistance - Patient 25 - 49%     Toileting Toileting    Toileting assist Assist for toileting: Maximal Assistance - Patient 25 - 49%     Transfers Chair/bed transfer  Transfers assist     Chair/bed transfer assist level: Moderate Assistance - Patient 50 - 74%     Locomotion Ambulation   Ambulation  assist      Assist level: 2 helpers Assistive device: No Device Max distance: 19   Walk 10 feet activity   Assist     Assist level: 2 helpers Assistive device: No Device   Walk 50 feet activity   Assist Walk 50 feet with 2 turns activity did not occur: Safety/medical concerns (weakness/fatigue/pain)         Walk 150 feet activity   Assist Walk 150 feet activity did not occur: Safety/medical concerns (weakness/fatigue/pain)         Walk 10 feet on uneven surface  activity   Assist Walk 10 feet on uneven surfaces activity did not occur: Safety/medical concerns (weakness/fatigue/pain)          Wheelchair     Assist Is the patient using a wheelchair?: Yes Type of Wheelchair: Manual Wheelchair activity did not occur: Refused         Wheelchair 50 feet with 2 turns activity    Assist    Wheelchair 50 feet with 2 turns activity did not occur: Refused       Wheelchair 150 feet activity     Assist  Wheelchair 150 feet activity did not occur: Refused       Blood pressure (!) 170/90, pulse 84, temperature 97.8 F (36.6 C), temperature source Oral, resp. rate 16, height 5' 1 (1.549 m), weight 74.3 kg, SpO2 96%.  Medical Problem List and Plan: 1. 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  Antithrombotics: -DVT/anticoagulation:  Pharmaceutical: Eliquis for A flutter             -antiplatelet therapy: N/A 3. Pain Management: Robaxin  500 mg every 8 hours as needed 4. Mood/Behavior/Sleep: Namenda  5 mg twice daily, Remeron  15 mg nightly, Xanax 0.25 mg twice daily as needed             -antipsychotic agents: Seroquel 12.5 mg nightly  -Start lexapro 10mg  daily  -Seen by pysch, continue current regimen 5. Neuropsych/cognition: This patient is not capable of making decisions on her own behalf due to vascular dementia 6. Skin/Wound Care: Routine skin checks 7. Fluids/Electrolytes/Nutrition: Routine and analysis with follow-up chemistries 8.  Hypertension.  Norvasc 5 mg daily.  Monitor with increased mobility    06/05/2024    1:06 PM 06/05/2024    5:22 AM 06/04/2024    7:25 PM  Vitals with BMI  Systolic 170  133  Diastolic 90  66  Pulse 84 67 78  -11/1 increase to 10mg  amlodipine  -11/2 add PRN hydralazine  10mg  PRN SBP >160  9.  Hyperlipidemia.  Lipitor 10.  GERD.   Protonix  11.  Hypothyroidism.  Synthroid  100 mcg daily 12. Hx of german measles of L eye as child and macular degeneration- not clear why cannot see out of L eye- could be old vs hemianopia 13. Vascular dementia- on namenda  14. Hx of L THA in 01/2024- and prior L TKA 15. Aflutter- on Eliquis- doesn't need rate control per Cards  -10/31-11/2 Hr stable overall 16. Insomnia- Con't Quetiapine 17. CKD, appears about CKD 3a at  baseline  -Recheck tomorrow       LOS: 3 days A FACE TO FACE EVALUATION WAS PERFORMED  Murray Collier 06/05/2024, 2:16 PM

## 2024-06-05 NOTE — Progress Notes (Signed)
 Speech Language Pathology Daily Session Note  Patient Details  Name: Donna Petty MRN: [id number] Date of Birth: Apr 27, 1936  Today's Date: 06/05/2024 SLP Individual Time: 1355-1433 SLP Individual Time Calculation (min): 38 min and Today's Date: 06/05/2024 SLP Missed Time: 22 Minutes Missed Time Reason: Patient unwilling to participate  Short Term Goals: Week 1: SLP Short Term Goal 1 (Week 1): Patient will participate in further cognitive linguistic evaluation SLP Short Term Goal 2 (Week 1): Patient will complete bedside swallow evaluation SLP Short Term Goal 3 (Week 1): Patient will recall cognitive and physical changes since admission given mod multimodal A SLP Short Term Goal 4 (Week 1): Patient will recall biographical information given mod multimodal A  Skilled Therapeutic Interventions:   Pt greeted in her room for tx targeting cognition. She appeared anxious re SLP's arrival and was listening to sungard. Given agitation from eval, SLP facilitated very functional/conversational tasks. She was able to recall some responsibilities in the home environment, which mostly included caring for the cats. When SLP provided pt w/ additional questions for clarification, pt noted to ignore SLP and continue singing her hymns. SLP then provided pt w/ a calendar. She required modA to clarify the date as the 2nd vs 22nd. She was able to read the clock independently to ID time of day. With functional problem solving re the hymns and positioning, she required modA cues. Pt anxiety noted in increase throughout functional problem solving task, and pt stopped to responding to SLP after. Her only utterance was I want to listen to my hymns! Tx tasks were discontinued, however, SLP was able to contact her daughter, [name]. She reported that the pt does not drive at baseline, could be left alone for hours at a time, was taking her meds from a pill organizer (completed by dtr), and managing her bills. Unsure how  well these tasks were being managed, as she also reported minimal change in the pt's memory/attention/problem solving since her CVA. Pt anxiety/disruption of routine may significantly impact her progress here and make her appear more impaired or pt may just complete ADLs/IADLs w/ less assistance in her familiar environment. Will continue w/ skilled ST services at this time targeting very functional/pt preferred tx tasks to encourage pt participation.  She was left in her bed w/ the alarm set and call light within reach.   Pain  No pain reported  Therapy/Group: Individual Therapy  Recardo DELENA Mole 06/05/2024, 2:39 PM

## 2024-06-05 NOTE — Progress Notes (Signed)
 Occupational Therapy Session Note  Patient Details  Name: Donna Petty MRN: [id number] Date of Birth: 10-Oct-1935  Today's Date: 06/05/2024 OT Individual Time: 0800-0900 OT Individual Time Calculation (min): 60 min    Short Term Goals: Week 1:  OT Short Term Goal 1 (Week 1): patient will complete toileting with mod A OT Short Term Goal 2 (Week 1): patient will complete LB dressing with mod A OT Short Term Goal 3 (Week 1): patient will complete standing during functional activities with min A for 45+ seconds  Skilled Therapeutic Interventions/Progress Updates:  Patient agreeable to participate in OT session. Reports no pain level.   Patient participated in skilled OT session focusing on ADL completion of Bathing and dressing. Patient received in bed. Upon OT arrival patient completed bed mobility SUP A with bed rails and HOB slightly elevated. Completed sit to stand with min to mod A and transfer with CG To min A with RW. OT attempted to facilitate upright posture during standing however patient stated he was unable to stand upright due to arthritis in back. Patient then completed UB bathing with SU A and UB dressing with SU. Patient reported she had to use bathroom during LB ADLS. Taken to toilet, able to complete toileting pants down min A, peri hygiene total A, pants up mod A. Patient transferred to wc with CGA. OT then replaced patients chair cushion due to pain. Patient required increased time today due to agitation during session. OT attempted to elicit increased compliance with activity and engagement through therapeutic use of self.    Therapy Documentation Precautions:  Precautions Precautions: Fall Recall of Precautions/Restrictions: Impaired Precaution/Restrictions Comments: L neglect Restrictions Weight Bearing Restrictions Per Provider Order: No Therapy/Group: Individual Therapy  D'mariea L Lounell Schumacher 06/05/2024, 7:47 AM

## 2024-06-05 NOTE — Progress Notes (Signed)
 Physical Therapy Session Note  Patient Details  Name: MALEEA CAMILO MRN: [id number] Date of Birth: [birth year]  Today's Date: 06/05/2024 PT Individual Time: 8996-8885 PT Individual Time Calculation (min): 71 min   Short Term Goals: Week 1:  PT Short Term Goal 1 (Week 1): Pt will perform supine <> sit transfers with min A PT Short Term Goal 2 (Week 1): Pt will perform sit <> stand transfers with LRAD with min A PT Short Term Goal 3 (Week 1): Pt will ambulate 50 ft using LRAD with min A  Skilled Therapeutic Interventions/Progress Updates:      Pt seated in WC upon arrival. Pt agreeable to therapy. Pt denies any pain.   Donned shoes with total A.   Pt ambulated 1x66 feet with RW and min A, 1x35 feet with R UE support on handrail, and L HHA, 1x28 feet with +2 BHHA, and 1x32 feet with youth RW and CGA with +2 for WC follow  with heavy trunk flexion, verbal/tactile cues provided for upright posture and safety with RW.   Sit to stand with RW and min A, verbal cues provided for attention to L LE positioning for improved activiation.   Pt performed step ups x3 6 inch steps with B handrail and min A ascending leading with R LE, descending leading with L LE. Progressed to stair navigation x4 6 inch steps with B handrail and step to gait with light mod A. Verbal cues provided for sequencing and technique. Pt required prolonged standing rest break at top of stairs 2/2 SOB/fatigue.  Pt overall requried verbal cues for attention to L visual field and L UE/LE throughout session.    Sit to supine with mod A for management of B LE.   Pt supine in bed with all needs within reach and bed alarm on.     Therapy Documentation Precautions:  Precautions Precautions: Fall Recall of Precautions/Restrictions: Impaired Precaution/Restrictions Comments: L neglect Restrictions Weight Bearing Restrictions Per Provider Order: No  Therapy/Group: Individual Therapy  [facility] Doreene Orris, [place],  DPT  06/05/2024, 7:49 AM

## 2024-06-05 NOTE — IPOC Note (Signed)
 Overall Plan of Care [facility]) Patient Details Name: Donna Petty MRN: [id number] DOB: 24-Aug-1935  Admitting Diagnosis: Acute ischemic right posterior cerebral artery (PCA) stroke [facility])  Hospital Problems: Principal Problem:   Acute ischemic right posterior cerebral artery (PCA) stroke (HCC) Active Problems:   Adjustment disorder with depressed mood   Stage 3a chronic kidney disease (HCC)     Functional Problem List: Nursing Bladder, Safety, Bowel, Endurance, Medication Management, Pain  PT Balance, Behavior, Endurance, Motor, Pain, Perception, Safety, Sensory, Skin Integrity  OT Balance, Behavior, Cognition, Safety, Endurance, Motor  SLP Cognition  TR         Basic ADL's: OT Grooming, Bathing, Dressing, Toileting     Advanced  ADL's: OT None     Transfers: PT Bed Mobility, Bed to Chair, Car, [employer], Floor  OT Toilet     Locomotion: PT Ambulation, Psychologist, [employer], Stairs     Additional Impairments: OT Fuctional Use of Upper Extremity  SLP Social Cognition   Problem Solving, Memory, Attention, Awareness  TR      Anticipated Outcomes Item Anticipated Outcome  Self Feeding mod I  Swallowing      Basic self-care  CGA  Toileting  CGA   Bathroom Transfers CGA  Bowel/Bladder  manage bowel with mod I and bladder with toileting  Transfers  supervision with LRAD  Locomotion  CGA with LRAD  Communication     Cognition  minA  Pain  Pain < 4 with prns  Safety/Judgment  manage safety w cues   Therapy Plan: PT Intensity: Minimum of 1-2 x/day ,45 to 90 minutes PT Frequency: 5 out of 7 days PT Duration Estimated Length of Stay: 12-14 OT Intensity: Minimum of 1-2 x/day, 45 to 90 minutes OT Frequency: 5 out of 7 days OT Duration/Estimated Length of Stay: 14-17 SLP Intensity: Minumum of 1-2 x/day, 30 to 90 minutes SLP Frequency: 3 to 5 out of 7 days SLP Duration/Estimated Length of Stay: 12-14 days   Team Interventions: Nursing Interventions  Patient/Family Education, Pain Management, Medication Management, Bladder Management, Bowel Management, Disease Management/Prevention, Discharge Planning, Psychosocial Support  PT interventions Ambulation/gait training, Balance/vestibular training, Cognitive remediation/compensation, Discharge planning, DME/adaptive equipment instruction, Functional mobility training, Neuromuscular re-education, Pain management, Patient/family education, Psychosocial support, Skin care/wound management, Splinting/orthotics, Stair training, Therapeutic Activities, Therapeutic Exercise, Disease management/prevention, Community reintegration, UE/LE Strength taining/ROM, UE/LE Coordination activities, Visual/perceptual remediation/compensation, Wheelchair propulsion/positioning, Functional electrical stimulation  OT Interventions Visual/perceptual remediation/compensation, Therapeutic Activities, Psychosocial support, Functional mobility training, Discharge planning, UE/LE Coordination activities, Patient/family education, Functional electrical stimulation, Community reintegration, UE/LE Strength taining/ROM, Pain management, DME/adaptive equipment instruction, Cognitive remediation/compensation, Wheelchair propulsion/positioning, Therapeutic Exercise, Self Care/advanced ADL retraining, Neuromuscular re-education, Disease mangement/prevention, Warden/ranger  SLP Interventions Cognitive remediation/compensation, Internal/external aids, Cueing hierarchy, Functional tasks, Patient/family education, Therapeutic Activities  TR Interventions    SW/CM Interventions Discharge Planning, Psychosocial Support, Patient/Family Education   Barriers to Discharge MD  Medical stability  Nursing Decreased caregiver support, Home environment access/layout 1 level 6 ste bil rail w dtr  PT Home environment access/layout, Behavior self-limiting behaviors  OT Decreased caregiver support    SLP Behavior    SW Insurance for SNF  coverage     Team Discharge Planning: Destination: PT-Home ,OT- Home , SLP-Home Projected Follow-up: PT-Home health PT, OT-  Home health OT, SLP- (TBD) Projected Equipment Needs: PT-To be determined, OT- 3 in 1 bedside comode, Rolling walker with 5 wheels, SLP-None recommended by SLP Equipment Details: PT-has RW, rollator, cane, OT-  Patient/family involved in discharge planning: PT-  Patient,  OT-Patient, SLP-Patient  MD ELOS: 14-16 Medical Rehab Prognosis:  Good Assessment: The patient has been admitted for CIR therapies with the diagnosis of right PCA large infarct with multiple small scattered infarcts in bilateral hemispheres with R P1 occlusion status post IR but unsuccessful due to tortuosity of vessel . The team will be addressing functional mobility, strength, stamina, balance, safety, adaptive techniques and equipment, self-care, bowel and bladder mgt, patient and caregiver education. Goals have been set at sup. Anticipated discharge destination is home.        See Team Conference Notes for weekly updates to the plan of care

## 2024-06-05 NOTE — Plan of Care (Signed)
  Problem: Consults Goal: RH STROKE PATIENT EDUCATION Description: See Patient Education module for education specifics  Outcome: Progressing Goal: Nutrition Consult-if indicated Outcome: Progressing   Problem: RH BOWEL ELIMINATION Goal: RH STG MANAGE BOWEL WITH ASSISTANCE Description: STG Manage Bowel with mod I Assistance. Outcome: Progressing Goal: RH STG MANAGE BOWEL W/MEDICATION W/ASSISTANCE Description: STG Manage Bowel with Medication with mod I Assistance. Outcome: Progressing   Problem: RH BLADDER ELIMINATION Goal: RH STG MANAGE BLADDER WITH ASSISTANCE Description: STG Manage Bladder With toileting Assistance Outcome: Progressing   Problem: RH SAFETY Goal: RH STG ADHERE TO SAFETY PRECAUTIONS W/ASSISTANCE/DEVICE Description: STG Adhere to Safety Precautions With cues Assistance/Device. Outcome: Progressing   Problem: RH PAIN MANAGEMENT Goal: RH STG PAIN MANAGED AT OR BELOW PT'S PAIN GOAL Description: Pain < 4 with prns Outcome: Progressing   Problem: RH KNOWLEDGE DEFICIT Goal: RH STG INCREASE KNOWLEDGE OF HYPERTENSION Description: Patient and dtr will be able to manage HTN using educational resources for medications and dietary modification independently Outcome: Progressing Goal: RH STG INCREASE KNOWLEGDE OF HYPERLIPIDEMIA Description: Patient and dtr will be able to manage HLD using educational resources for medications and dietary modification independently Outcome: Progressing Goal: RH STG INCREASE KNOWLEDGE OF STROKE PROPHYLAXIS Description: Patient and dtr will be able to manage secondary risks using educational resources for medications and dietary modification independently Outcome: Progressing   Problem: RH Vision Goal: RH LTG Vision (Specify) Outcome: Progressing

## 2024-06-06 DIAGNOSIS — I63531 Cerebral infarction due to unspecified occlusion or stenosis of right posterior cerebral artery: Secondary | ICD-10-CM | POA: Diagnosis not present

## 2024-06-06 LAB — BASIC METABOLIC PANEL WITH GFR
Anion gap: 12 (ref 5–15)
BUN: 19 mg/dL (ref 8–23)
CO2: 21 mmol/L — ABNORMAL LOW (ref 22–32)
Calcium: 8.9 mg/dL (ref 8.9–10.3)
Chloride: 107 mmol/L (ref 98–111)
Creatinine, Ser: 1.29 mg/dL — ABNORMAL HIGH (ref 0.44–1.00)
GFR, Estimated: 40 mL/min — ABNORMAL LOW (ref >60)
Glucose, Bld: 100 mg/dL — ABNORMAL HIGH (ref 70–99)
Potassium: 4.2 mmol/L (ref 3.5–5.1)
Sodium: 140 mmol/L (ref 135–145)

## 2024-06-06 LAB — CBC
HCT: 38.3 % (ref 36.0–46.0)
Hemoglobin: 12.6 g/dL (ref 12.0–15.0)
MCH: 30.4 pg (ref 26.0–34.0)
MCHC: 32.9 g/dL (ref 30.0–36.0)
MCV: 92.5 fL (ref 80.0–100.0)
Platelets: 336 K/uL (ref 150–400)
RBC: 4.14 MIL/uL (ref 3.87–5.11)
RDW: 14.9 % (ref 11.5–15.5)
WBC: 7.1 K/uL (ref 4.0–10.5)
nRBC: 0 % (ref 0.0–0.2)

## 2024-06-06 NOTE — Progress Notes (Signed)
 Speech Language Pathology Daily Session Note  Patient Details  Name: Donna Petty MRN: [id number] Date of Birth: [birth year]  Today's Date: 06/06/2024 SLP Individual Time: 0800-0856 SLP Individual Time Calculation (min): 56 min  Short Term Goals: Week 1: SLP Short Term Goal 1 (Week 1): Patient will participate in further cognitive linguistic evaluation SLP Short Term Goal 2 (Week 1): Patient will complete bedside swallow evaluation SLP Short Term Goal 3 (Week 1): Patient will recall cognitive and physical changes since admission given mod multimodal A SLP Short Term Goal 4 (Week 1): Patient will recall biographical information given mod multimodal A  Skilled Therapeutic Interventions: Skilled therapy session focused on cognitive goals. Patient initially resistant to participate, however SLP provided encouragement as able. Patient shared broad activities completed in prior sessions over the weekend (PT/ST) and recalled todays date with use of calendar. SLP prompted patient to interpret todays schedule in which she completed with minA to attend to the L. SLP continued to target L attention through completion of word search task. Patient required min fading to supervisionA to complete. Patient left in bed with alarm set and call bell in reach. Continue POC  Pain None reported   Therapy/Group: Individual Therapy  Crit Obremski M.A., CCC-SLP 06/06/2024, 7:43 AM

## 2024-06-06 NOTE — Progress Notes (Signed)
 Occupational Therapy Session Note  Patient Details  Name: Donna Petty MRN: [id number] Date of Birth: June 03, 1936  Today's Date: 06/06/2024 OT Individual Time: 1400-1500 OT Individual Time Calculation (min): 60 min    Short Term Goals: Week 1:  OT Short Term Goal 1 (Week 1): patient will complete toileting with mod A OT Short Term Goal 2 (Week 1): patient will complete LB dressing with mod A OT Short Term Goal 3 (Week 1): patient will complete standing during functional activities with min A for 45+ seconds  Skilled Therapeutic Interventions/Progress Updates:    Pt received in recliner agreeable to a shower.  Pt did not want to get hair wet so brought in a shower cap.   Pt fairly motivated today demonstrating good initiation with movement but had difficulty responding to cues for positioning. For example, due to L inattention when she would go to sit down on toilet, tub bench and recliner , she needed MAX verbal and tactile cues to sit in center of seat as she would sit with L hip too far over to edge of seat or about to sit on arm rest.    Fair attention to L side of body with bathing and dressing.  Mod A showering.  Pt wanted to wear pad in underwear but she had an incontinent episode just prior to shower. Pt had sat on toilet to void but then when she said she was finished, stood up and a large amount of urine came out as she was standing.  Had pt use brief.    Because it was the end of her therapy day, had pt wear gown and non slip socks.    Pt finished brushing hair and applying lotion to face. My daughter always applies my lotion after my shower I then helped her apply it to legs.    Pt sat in recliner to rest.  Alarm on and all needs met.    Therapy Documentation Precautions:  Precautions Precautions: Fall Recall of Precautions/Restrictions: Impaired Precaution/Restrictions Comments: L neglect Restrictions Weight Bearing Restrictions Per Provider Order: No    Vital  Signs: Therapy Vitals Temp: 98.2 F (36.8 C) Temp Source: Oral Pulse Rate: 70 Resp: 20 BP: (!) 168/86 Patient Position (if appropriate): Sitting Oxygen Therapy SpO2: 96 % O2 Device: Room Air Pain:  No c/o pain  ADL: ADL Equipment Provided: Reacher Eating: Set up Where Assessed-Eating: Wheelchair Grooming: Setup Where Assessed-Grooming: Sitting at sink Upper Body Bathing: Supervision/safety Where Assessed-Upper Body Bathing: Shower Lower Body Bathing: Moderate assistance Where Assessed-Lower Body Bathing: Shower Upper Body Dressing: Setup Where Assessed-Upper Body Dressing: Chair Lower Body Dressing: Maximal assistance Where Assessed-Lower Body Dressing: Chair Toileting: Maximal assistance Where Assessed-Toileting: Teacher, Adult Education: Moderate assistance Toilet Transfer Method: Stand pivot Toilet Transfer Equipment: Grab bars, Raised toilet seat Film/video Editor: Moderate assistance Film/video Editor Method: Warden/ranger: Emergency planning/management officer     Therapy/Group: Individual Therapy  Nur Rabold 06/06/2024, 3:38 PM

## 2024-06-06 NOTE — Progress Notes (Signed)
 Occupational Therapy Session Note  Patient Details  Name: Donna Petty MRN: [id number] Date of Birth: 09/09/1935  Today's Date: 06/06/2024 OT Individual Time: 9089-9063 OT Individual Time Calculation (min): 26 min    Short Term Goals: Week 1:  OT Short Term Goal 1 (Week 1): patient will complete toileting with mod A OT Short Term Goal 2 (Week 1): patient will complete LB dressing with mod A OT Short Term Goal 3 (Week 1): patient will complete standing during functional activities with min A for 45+ seconds  Skilled Therapeutic Interventions/Progress Updates:      Therapy Documentation Precautions:  Precautions Precautions: Fall Recall of Precautions/Restrictions: Impaired Precaution/Restrictions Comments: L neglect Restrictions Weight Bearing Restrictions Per Provider Order: No General: Pt supine in bed upon OT arrival, agreeable to OT session.  Pain: no pain reported  Exercises: Pt completed the following exercise circuit in order to improve functional activity, strength and endurance to prepare for ADLs such as bathing. Pt completed the following exercises in seated position with no noted LOB/SOB and 1x10 repetitions on each exercise: -bicep curls -triceps extensions -shoulder abduction -forward punches  Other Treatments: OT providing therapeutic use of self in order to build rapport and discuss patient current situation and goals for therapy.   Pt supine in bed with bed alarm activated, 3 bed rails up, call light within reach and 4Ps assessed.   Therapy/Group: Individual Therapy  [name] Hoe, OTD, OTR/L 06/06/2024, 9:50 AM

## 2024-06-06 NOTE — Progress Notes (Signed)
 Physical Therapy Session Note  Patient Details  Name: Donna Petty MRN: [id number] Date of Birth: Aug 22, 1935  Today's Date: 06/06/2024 PT Individual Time: 8953-8844 PT Individual Time Calculation (min): 69 min   Short Term Goals: Week 1:  PT Short Term Goal 1 (Week 1): Pt will perform supine <> sit transfers with min A PT Short Term Goal 2 (Week 1): Pt will perform sit <> stand transfers with LRAD with min A PT Short Term Goal 3 (Week 1): Pt will ambulate 50 ft using LRAD with min A  Skilled Therapeutic Interventions/Progress Updates:   Received pt semi-reclined in bed, pt agreeable to PT treatment, and denied any pain at rest, but appeared to have pain in LLE with mobility. Session with emphasis on functional mobility/transfers, dressing, toileting, generalized strengthening and endurance, dynamic standing balance/coordination, stair navigation, and ambulation. Pt transferred semi-reclined<>sitting R EOB with HOB elevated and use of bedrails with supervision and ++ time/effort. Removed nightgown and donned bra with min A and pull over shirt with supervision. Donned pants and shoes sitting EOB with max A. Stood from EOB with RW and heavy min A and reported urge to void - transferred onto bedside commode with RW and min A and required max A for clothing management. Pt able to void and dependent for hygiene management in standing.   Pt transported to/from room in [facility] dependently for time management purposes. Pt performed remainder of transfers with RW and min A throughout session - cues for hand placement when standing and sitting. Pt ambulated 59ft x 2 trials with youth RW and CGA with close WC follow and extended rest break in between trials. Pt ambulates with extreme kyphosis (baseline), decreased foot clearance, and with mild L inattention frequently kicking RW with L foot. Challenged pt to beat her walking record from yesterday of 12ft - pt declined ambulating another loop but agreed to do a  little more. Ambulated additional 27ft with RW and CGA to fatigue and with new record of 133ft.  Stood at staircase with min A and navigated 4 6in steps with bilateral handrails and min A ascending and descending with a step to pattern. Pt stated she peed in her brief and transported back to room dependently. Stood at sink and removed soiled brief, pants, and shoes with max A. Performed hygiene management in standing dependently, then donned clean brief, scrub pants, and shoes with max A. Pt ambulated 152ft with RW and min A to recliner and concluded session with pt sitting in recliner with all needs within reach. Provided pt with ensure to drink.   Therapy Documentation Precautions:  Precautions Precautions: Fall Recall of Precautions/Restrictions: Impaired Precaution/Restrictions Comments: L neglect Restrictions Weight Bearing Restrictions Per Provider Order: No  Therapy/Group: Individual Therapy Therisa HERO Zaunegger Therisa Stains PT, DPT 06/06/2024, 7:07 AM

## 2024-06-06 NOTE — Progress Notes (Signed)
 PROGRESS NOTE   Subjective/Complaints: No new complaints from patient or her OT [name] today Discussed that labs are stable Patient's chart reviewed- No issues reported overnight Vitals signs stable except for HTN  ROS: Patient denies fever, new vision changes, dizziness,  vomiting, diarrhea,  shortness of breath or chest pain, headache + depressed mood- continued, denies SI or HI + nausea  Objective:   No results found. Recent Labs    06/06/24 0505  WBC 7.1  HGB 12.6  HCT 38.3  PLT 336    Recent Labs    06/06/24 0505  NA 140  K 4.2  CL 107  CO2 21*  GLUCOSE 100*  BUN 19  CREATININE 1.29*  CALCIUM  8.9     Intake/Output Summary (Last 24 hours) at 06/06/2024 1346 Last data filed at 06/06/2024 1320 Gross per 24 hour  Intake 708 ml  Output --  Net 708 ml         Physical Exam: Vital Signs Blood pressure (!) 168/86, pulse 70, temperature 98.2 F (36.8 C), temperature source Oral, resp. rate 20, height 5' 1 (1.549 m), weight 74.3 kg, SpO2 96%.  General: No apparent distress, laying in bed, obese, working with OT HEENT: Head is normocephalic, atraumatic, sclera anicteric, oral mucosa pink and moist Heart: Reg rate Rhythm irregular. No murmurs rubs or gallops Chest: CTA bilaterally, non-labored Abdomen: Soft, non-tender, non-distended, bowel sounds positive. Extremities: No clubbing, cyanosis, or edema. Pulses are 2+ Psych: Very flat Skin: Many bruises throughout esp LUE distally and R inner thigh  Neuro:  alert and oriented x3, Right gaze preference but can look left.  L hemianopia  -had german measles as child affecting l eye and macular degeneration . Minimal verbal output. Follows simple commands.  MOTOR: RUE and RLE 5/5, other than Ankle DF 4/5- may be effort limited LUE/LLE- 5-/5 throughout  other than Ankle DF 4/5- may be effort limited  No hypertonia noted   Prior neuro assessment is c/w  today's exam 06/06/2024.    Assessment/Plan: 1. Functional deficits which require 3+ hours per day of interdisciplinary therapy in a comprehensive inpatient rehab setting. Physiatrist is providing close team supervision and 24 hour management of active medical problems listed below. Physiatrist and rehab team continue to assess barriers to discharge/monitor patient progress toward functional and medical goals  Care Tool:  Bathing    Body parts bathed by patient: Right arm, Left arm, Chest, Front perineal area, Abdomen, Right upper leg, Left upper leg, Face   Body parts bathed by helper: Buttocks, Right lower leg, Left lower leg     Bathing assist Assist Level: Maximal Assistance - Patient 24 - 49%     Upper Body Dressing/Undressing Upper body dressing   What is the patient wearing?: Pull over shirt    Upper body assist Assist Level: Contact Guard/Touching assist    Lower Body Dressing/Undressing Lower body dressing      What is the patient wearing?: Underwear/pull up, Pants     Lower body assist Assist for lower body dressing: Maximal Assistance - Patient 25 - 49%     Toileting Toileting    Toileting assist Assist for toileting: Maximal Assistance - Patient  25 - 49%     Transfers Chair/bed transfer  Transfers assist     Chair/bed transfer assist level: Minimal Assistance - Patient > 75%     Locomotion Ambulation   Ambulation assist      Assist level: Minimal Assistance - Patient > 75% Assistive device: Walker-rolling Max distance: 87ft   Walk 10 feet activity   Assist     Assist level: Minimal Assistance - Patient > 75% Assistive device: Walker-rolling   Walk 50 feet activity   Assist Walk 50 feet with 2 turns activity did not occur: Safety/medical concerns (weakness/fatigue/pain)  Assist level: Minimal Assistance - Patient > 75% Assistive device: Walker-rolling    Walk 150 feet activity   Assist Walk 150 feet activity did not occur:  Safety/medical concerns (weakness/fatigue/pain)         Walk 10 feet on uneven surface  activity   Assist Walk 10 feet on uneven surfaces activity did not occur: Safety/medical concerns (weakness/fatigue/pain)         Wheelchair     Assist Is the patient using a wheelchair?: Yes Type of Wheelchair: Manual Wheelchair activity did not occur: Refused         Wheelchair 50 feet with 2 turns activity    Assist    Wheelchair 50 feet with 2 turns activity did not occur: Refused       Wheelchair 150 feet activity     Assist  Wheelchair 150 feet activity did not occur: Refused       Blood pressure (!) 168/86, pulse 70, temperature 98.2 F (36.8 C), temperature source Oral, resp. rate 20, height 5' 1 (1.549 m), weight 74.3 kg, SpO2 96%.  Medical Problem List and Plan: 1. 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  Antithrombotics: -DVT/anticoagulation:  Pharmaceutical: Eliquis for A flutter             -antiplatelet therapy: N/A 3. Pain Management: Robaxin  500 mg every 8 hours as needed 4. Mood/Behavior/Sleep: Namenda  5 mg twice daily, Remeron  15 mg nightly, Xanax 0.25 mg twice daily as needed             -antipsychotic agents: Seroquel 12.5 mg nightly  -Start lexapro 10mg  daily  -Seen by pysch, continue current regimen 5. Neuropsych/cognition: This patient is not capable of making decisions on her own behalf due to vascular dementia 6. Skin/Wound Care: Routine skin checks 7. Fluids/Electrolytes/Nutrition: Routine and analysis with follow-up chemistries 8.  Hypertension.  Increase Norvasc to 10mg  daily. Monitor with increased mobility    06/06/2024    1:09 PM 06/06/2024     6:26 AM 06/05/2024    8:42 PM  Vitals with BMI  Systolic 168 150 862  Diastolic 86 80 89  Pulse 70 64 77  -11/1 increase to 10mg  amlodipine  -11/2 add PRN hydralazine  10mg  PRN SBP >160  9.  Hyperlipidemia.  Lipitor 10.  GERD.  Protonix  11.  Hypothyroidism.  Synthroid  100 mcg daily 12. Hx of german measles of L eye as child and macular degeneration- not clear why cannot see out of  L eye- could be old vs hemianopia 13. Vascular dementia- on namenda  14. Hx of L THA in 01/2024- and prior L TKA  15. Aflutter- continue Eliquis- doesn't need rate control per Cards  -10/31-11/2 Hr stable overall  16. Insomnia: continue Quetiapine  17. CKD3a: creatinine reviewed and is stable      LOS: 4 days A FACE TO FACE EVALUATION WAS PERFORMED  Donna Petty 06/06/2024, 1:46 PM

## 2024-06-07 DIAGNOSIS — I63531 Cerebral infarction due to unspecified occlusion or stenosis of right posterior cerebral artery: Secondary | ICD-10-CM | POA: Diagnosis not present

## 2024-06-07 NOTE — Progress Notes (Signed)
 Occupational Therapy Session Note  Patient Details  Name: Donna Petty MRN: [id number] Date of Birth: Jun 07, 1936  Today's Date: 06/07/2024 OT Individual Time: 1016-1130 OT Individual Time Calculation (min): 74 min    Short Term Goals: Week 1:  OT Short Term Goal 1 (Week 1): patient will complete toileting with mod A OT Short Term Goal 2 (Week 1): patient will complete LB dressing with mod A OT Short Term Goal 3 (Week 1): patient will complete standing during functional activities with min A for 45+ seconds  Skilled Therapeutic Interventions/Progress Updates:  Patient agreeable to participate in OT session. Reports no pain level.   Patient participated in skilled OT session focusing on functional use of UE, visual field deficits, functional mobility, transfers. Patient received in bed. Patient completed transfer to chair CGA to minA with mod vcs. Patient completed grooming at sink with SU assist. Patient then transported to gym to complete visual field activity utilizing scanning and peripheral vision to increase safety. Patient able to complete utilizing colored circles and aquigz with good skill. Patient then completed 9 hole peg test to determine UE coordination, timing, and accuracy. R 38.9, L 57.04. patient then completed functional mobility 2x 45 ft with RW with CGA with OT bringing wc due to increased fatigue. Patient returned to the room to attempt to complete toileting. Patient declined to toilet with reporting no urge to void. Patient completed wc transfer to bed with min A. Patient left in room alarm on all needs in reach.    Therapy Documentation Precautions:  Precautions Precautions: Fall Recall of Precautions/Restrictions: Impaired Precaution/Restrictions Comments: L neglect Restrictions Weight Bearing Restrictions Per Provider Order: No Therapy/Group: Individual Therapy  D'mariea L Jsoeph Podesta 06/07/2024, 8:44 AM

## 2024-06-07 NOTE — Progress Notes (Signed)
 PROGRESS NOTE   Subjective/Complaints: No complaints this morning BP improving Patient's chart reviewed- No issues reported overnight Vitals signs stable   ROS: Patient denies fever, new vision changes, dizziness,  vomiting, diarrhea,  shortness of breath or chest pain, headache + depressed mood- continued, denies SI or HI + nausea  Objective:   No results found. Recent Labs    06/06/24 0505  WBC 7.1  HGB 12.6  HCT 38.3  PLT 336    Recent Labs    06/06/24 0505  NA 140  K 4.2  CL 107  CO2 21*  GLUCOSE 100*  BUN 19  CREATININE 1.29*  CALCIUM  8.9     Intake/Output Summary (Last 24 hours) at 06/07/2024 1315 Last data filed at 06/07/2024 1254 Gross per 24 hour  Intake 1539 ml  Output --  Net 1539 ml         Physical Exam: Vital Signs Blood pressure (!) 158/60, pulse 64, temperature 98.1 F (36.7 C), resp. rate 16, height 5' 1 (1.549 m), weight 74.3 kg, SpO2 94%.  General: No apparent distress, laying in bed HEENT: Head is normocephalic, atraumatic, sclera anicteric, oral mucosa pink and moist Heart: Reg rate Rhythm irregular. No murmurs rubs or gallops Chest: CTA bilaterally, non-labored Abdomen: Soft, non-tender, non-distended, bowel sounds positive. Extremities: No clubbing, cyanosis, or edema. Pulses are 2+ Psych: Very flat Skin: Many bruises throughout esp LUE distally and R inner thigh  Neuro:  alert and oriented x3, Right gaze preference but can look left.  L hemianopia  -had german measles as child affecting l eye and macular degeneration . Minimal verbal output. Follows simple commands.  MOTOR: RUE and RLE 5/5, other than Ankle DF 4/5- may be effort limited LUE/LLE- 5-/5 throughout  other than Ankle DF 4/5- may be effort limited  No hypertonia noted   Prior neuro assessment is c/w today's exam 06/07/2024.    Assessment/Plan: 1. Functional deficits which require 3+ hours per day of  interdisciplinary therapy in a comprehensive inpatient rehab setting. Physiatrist is providing close team supervision and 24 hour management of active medical problems listed below. Physiatrist and rehab team continue to assess barriers to discharge/monitor patient progress toward functional and medical goals  Care Tool:  Bathing    Body parts bathed by patient: Right arm, Left arm, Chest, Front perineal area, Abdomen, Right upper leg, Left upper leg, Face   Body parts bathed by helper: Buttocks, Right lower leg, Left lower leg     Bathing assist Assist Level: Moderate Assistance - Patient 50 - 74%     Upper Body Dressing/Undressing Upper body dressing   What is the patient wearing?: Dress    Upper body assist Assist Level: Supervision/Verbal cueing    Lower Body Dressing/Undressing Lower body dressing      What is the patient wearing?: Incontinence brief     Lower body assist Assist for lower body dressing: Dependent - Patient 0%     Toileting Toileting    Toileting assist Assist for toileting: Maximal Assistance - Patient 25 - 49%     Transfers Chair/bed transfer  Transfers assist     Chair/bed transfer assist level: Moderate Assistance - Patient  50 - 74%     Locomotion Ambulation   Ambulation assist      Assist level: Minimal Assistance - Patient > 75% Assistive device: Walker-rolling Max distance: 57ft   Walk 10 feet activity   Assist     Assist level: Minimal Assistance - Patient > 75% Assistive device: Walker-rolling   Walk 50 feet activity   Assist Walk 50 feet with 2 turns activity did not occur: Safety/medical concerns (weakness/fatigue/pain)  Assist level: Minimal Assistance - Patient > 75% Assistive device: Walker-rolling    Walk 150 feet activity   Assist Walk 150 feet activity did not occur: Safety/medical concerns (weakness/fatigue/pain)         Walk 10 feet on uneven surface  activity   Assist Walk 10 feet on  uneven surfaces activity did not occur: Safety/medical concerns (weakness/fatigue/pain)         Wheelchair     Assist Is the patient using a wheelchair?: Yes Type of Wheelchair: Manual Wheelchair activity did not occur: Refused         Wheelchair 50 feet with 2 turns activity    Assist    Wheelchair 50 feet with 2 turns activity did not occur: Refused       Wheelchair 150 feet activity     Assist  Wheelchair 150 feet activity did not occur: Refused       Blood pressure (!) 158/60, pulse 64, temperature 98.1 F (36.7 C), resp. rate 16, height 5' 1 (1.549 m), weight 74.3 kg, SpO2 94%.  Medical Problem List and Plan: 1. Functional deficits secondary to right PCA large infarct with multiple small scattered infarcts in bilateral hemispheres with R P1 occlusion status post IR but unsuccessful due to tortuosity of vessel             -patient may  shower             -ELOS/Goals: 14-16 days supervision of PT, OT and SLP             -Continue CIR  2.  Antithrombotics: -DVT/anticoagulation:  Pharmaceutical: Eliquis for A flutter             -antiplatelet therapy: N/A 3. Pain Management: Robaxin  500 mg every 8 hours as needed 4. Mood/Behavior/Sleep: Namenda  5 mg twice daily, Remeron  15 mg nightly, Xanax 0.25 mg twice daily as needed             -antipsychotic agents: Seroquel 12.5 mg nightly  -Start lexapro 10mg  daily  -Seen by pysch, continue current regimen 5. Neuropsych/cognition: This patient is not capable of making decisions on her own behalf due to vascular dementia 6. Skin/Wound Care: Routine skin checks 7. Fluids/Electrolytes/Nutrition: Routine and analysis with follow-up chemistries 8.  Hypertension.  Continue Norvasc to 10mg  daily. Monitor with increased mobility    06/07/2024    5:15 AM 06/06/2024    8:05 PM 06/06/2024    1:09 PM  Vitals with BMI  Systolic 158 138 831  Diastolic 60 64 86  Pulse 64 79 70   -11/2 add PRN hydralazine  10mg  PRN SBP  >160  9.  Hyperlipidemia.  Lipitor 10.  GERD.  Protonix  11.  Hypothyroidism.  Synthroid  100 mcg daily 12. Hx of german measles of L eye as child and macular degeneration- not clear why cannot see out of L eye- could be old vs hemianopia 13. Vascular dementia- continue namenda   14. Hx of L THA in 01/2024- and prior L TKA  15. Aflutter- continue Eliquis- doesn't need  rate control per Cards  16. Insomnia: continue quetiapine  17. CKD3a: creatinine reviewed and is stable      LOS: 5 days A FACE TO FACE EVALUATION WAS PERFORMED  Donna Petty 06/07/2024, 1:15 PM

## 2024-06-07 NOTE — Progress Notes (Signed)
 Speech Language Pathology Daily Session Note  Patient Details  Name: Donna Petty MRN: [id number] Date of Birth: 11-30-1935  Today's Date: 06/07/2024 SLP Individual Time: 1430-1500 SLP Individual Time Calculation (min): 30 min  Short Term Goals: Week 1: SLP Short Term Goal 1 (Week 1): Patient will participate in further cognitive linguistic evaluation SLP Short Term Goal 2 (Week 1): Patient will complete bedside swallow evaluation SLP Short Term Goal 2 - Progress (Week 1): Discontinued (comment) (patient and patients daughter reporting no changes/issues with swallowing since admission) SLP Short Term Goal 3 (Week 1): Patient will recall cognitive and physical changes since admission given mod multimodal A SLP Short Term Goal 4 (Week 1): Patient will recall biographical information given mod multimodal A  Skilled Therapeutic Interventions: Skilled therapy session focused on cognitive goals. Upon entrance, patients daughter present. Patients daughter reports changes in intellectual awareness and memory as patient with difficulty recalling she had a stroke and the subequent changes to cognitive/physical functioning. Patient initially reluctant to respond to SLP, however with encouragement from daughter, she participated in abstract card game. Patient required modA to utilize verbalized instructions to participate in game. Patient with increased frustration and eventual refusal to participate once daughter left room. Patient was able to recall activities completed in PT/OT this date and correct channel for [place]. 15 minutes of ST missed due to patient increasing frustration and refusal. Patient left in bed with alarm set and call bell in reach. Continue POC  Pain None reported   Therapy/Group: Individual Therapy  Aryiana Klinkner M.A., CCC-SLP 06/07/2024, 7:39 AM

## 2024-06-07 NOTE — Progress Notes (Signed)
 Physical Therapy Session Note  Patient Details  Name: Donna Petty MRN: [id number] Date of Birth: 1936-07-24  Today's Date: 06/07/2024 PT Individual Time: 0800-0900 PT Individual Time Calculation (min): 60 min   Short Term Goals: Week 1:  PT Short Term Goal 1 (Week 1): Pt will perform supine <> sit transfers with min A PT Short Term Goal 2 (Week 1): Pt will perform sit <> stand transfers with LRAD with min A PT Short Term Goal 3 (Week 1): Pt will ambulate 50 ft using LRAD with min A  Skilled Therapeutic Interventions/Progress Updates:     Pt supine in bed upon arrival, receiving medications with nursing. Pt reports some indigestion pains but agreeable to therapy.  Session emphasized functional strengthening and endurance/activity tolerance during transfers, ambulation, and stair negotiation. Pt performed supine to sitting EOB transfer using hospital bed features with supervision and extra time. Pt demonstrated good seated balance EOB to perform dressing. Pt removed nightgown and donned bra with min A and donned shirt with supervision. Donning pants required max A. Pt performed stand pivot transfer EOB to WC using RW with min A. Pt transported to/from room dependent in [facility] for time management. Pt ambulated ~82 ft using RW with min A progressing to CGA with WC in tow - pt demonstrates significant forward flexed trunk posture with ambulation. Pt ambulated another ~80 ft that included turning around toward L at end of hall with CGA and significant verbal and visual cues for turning due to L inattention. Pt asc/desc 4-6 inch steps using B HR with min A. Pt used step-to pattern and required verbal cueing for sequencing (up with good, down with bad). Pt required seated rest break afterwards - performed alternating LAQ during rest break. Pt performed alternating toe taps on 6 inch step with B UE support and min A. Pt demonstrated more difficulty lifting and controlling L LE during activity.  Once back  in room, pt performed stand pivot transfer WC to EOB using RW with min A. Pt required min A and verbal cueing to lift L LE into bed while transitioning to supine. Bed alarm set and all needs within reach on R side at end of session.  Therapy Documentation Precautions:  Precautions Precautions: Fall Recall of Precautions/Restrictions: Impaired Precaution/Restrictions Comments: L neglect Restrictions Weight Bearing Restrictions Per Provider Order: No  Therapy/Group: Individual Therapy  [name], DPT 06/07/2024, 8:04 AM

## 2024-06-07 NOTE — Progress Notes (Signed)
 Occupational Therapy Session Note  Patient Details  Name: Donna Petty MRN: [id number] Date of Birth: 1935-10-08  Today's Date: 06/07/2024 OT Individual Time: 1400-1430 OT Individual Time Calculation (min): 30 min    Short Term Goals: Week 1:  OT Short Term Goal 1 (Week 1): patient will complete toileting with mod A OT Short Term Goal 2 (Week 1): patient will complete LB dressing with mod A OT Short Term Goal 3 (Week 1): patient will complete standing during functional activities with min A for 45+ seconds  Skilled Therapeutic Interventions/Progress Updates:      Therapy Documentation Precautions:  Precautions Precautions: Fall Recall of Precautions/Restrictions: Impaired Precaution/Restrictions Comments: L neglect Restrictions Weight Bearing Restrictions Per Provider Order: No General: Pt supine in bed upon OT arrival, agreeable to OT session. Daughter present  Pain: no pain reported   Other Treatments: OT providing therapeutic use of self in order to build rapport and discuss patient current situation and goals for therapy. OT providing education to pt and daughter during session regarding ELOS and goals for therapies d/t pt daughter inquiring. Pt daughter saying TTB will not fit in bathroom at home. OT educating pt and daughter other options for shower seating and also recommending installed grab bar if possible for increased safety. Pt daughter reporting wanting pt to be as independent as possible d/t her also caring for husband who recently had back surgery.  OT also educating on stroke acronym BE FAST. Pt and daughter appreciative of information.    Pt supine in bed with bed alarm activated, 2 bed rails up, call light within reach and 4Ps assessed.   Therapy/Group: Individual Therapy  [name] Hoe, OTD, OTR/L 06/07/2024, 5:37 PM

## 2024-06-08 DIAGNOSIS — I63531 Cerebral infarction due to unspecified occlusion or stenosis of right posterior cerebral artery: Secondary | ICD-10-CM | POA: Diagnosis not present

## 2024-06-08 DIAGNOSIS — N1831 Chronic kidney disease, stage 3a: Secondary | ICD-10-CM | POA: Diagnosis not present

## 2024-06-08 DIAGNOSIS — F4321 Adjustment disorder with depressed mood: Secondary | ICD-10-CM

## 2024-06-08 DIAGNOSIS — I10 Essential (primary) hypertension: Secondary | ICD-10-CM | POA: Diagnosis not present

## 2024-06-08 MED ORDER — PANTOPRAZOLE SODIUM 40 MG PO TBEC
40.0000 mg | DELAYED_RELEASE_TABLET | Freq: Every day | ORAL | Status: DC
  Administered 2024-06-09 – 2024-06-15 (×7): 40 mg via ORAL
  Filled 2024-06-08 (×7): qty 1

## 2024-06-08 MED ORDER — ATORVASTATIN CALCIUM 10 MG PO TABS
20.0000 mg | ORAL_TABLET | Freq: Every day | ORAL | Status: DC
  Administered 2024-06-09 – 2024-06-14 (×4): 20 mg via ORAL
  Filled 2024-06-08 (×5): qty 2

## 2024-06-08 MED ORDER — ACETAMINOPHEN 325 MG PO TABS: 650.0000 mg | ORAL_TABLET | ORAL | Status: AC | PRN

## 2024-06-08 MED ORDER — POLYETHYLENE GLYCOL 3350 17 G PO PACK: 17.0000 g | PACK | Freq: Every day | ORAL | Status: AC | PRN

## 2024-06-08 NOTE — Progress Notes (Signed)
 Nurse reviewed scheduled medications with the patient. Patient requested to adjust the administration times for Protonix  and Lipitor, and agreed to maintain the current schedule for all other medications.

## 2024-06-08 NOTE — Progress Notes (Signed)
 Physical Therapy Session Note  Patient Details  Name: Donna Petty MRN: [id number] Date of Birth: 1936-04-07  Today's Date: 06/08/2024 PT Individual Time: 1100-1200, 1445-1530 PT Individual Time Calculation (min): 60 min, 45 min  Short Term Goals: Week 1:  PT Short Term Goal 1 (Week 1): Pt will perform supine <> sit transfers with min A PT Short Term Goal 2 (Week 1): Pt will perform sit <> stand transfers with LRAD with min A PT Short Term Goal 3 (Week 1): Pt will ambulate 50 ft using LRAD with min A  Skilled Therapeutic Interventions/Progress Updates:     Treatment 1: Pt supine in bed taking a nap upon arrival. Pt denies pain and agreeable to therapy. Session emphasized functional strengthening, activity tolerance/endurance, ambulation with RW, and seated/standing balance, and NMR. Pt required min A and use of hospital bed features for supine to sitting EOB. Pt performed stand pivot transfer EOB <> WC with min/mod A and verbal cueing for safety. Pt ambulated 60 ft and 108 ft using RW with CGA/min A with WC in tow. Pt with significant forward trunk lean despite verbal cueing. Verbal cues required to maintain safe RW positioning and to avoid walls on L - pt with tendency to veer to the L due to inattention. Pt participated in seated and standing dynamic balance activities reaching to place/pull off squigs from mirror. Pt practiced standing balance with single UE support on RW (R and L) and min A as well as no UE support with mod A. Pt demonstrated L knee flexion in stance (due to weakness and/or L inattention?). Worked on lateral weight shifting with B UE support in RW with verbal and manual cues to extend L knee. Once back in room and seated EOB, pt returned to supine with mod A for L LE and min A otherwise along with max verbal cueing. Pt also required max verbal cues and mod A to reposition herself in bed using B UE and B LE. All needs within reach and bed alarm set at end of session. Pt  demonstrated self-liming behaviors and appeared less motivated today.  Treatment 2: Pt seated in WC upon arrival. Denies pain and agreeable to therapy but states she is tired after OT session. Session emphasized functional strengthening, activity tolerance/ endurance, and WC mobility. Pt instructed in and self-propelled WC ~50 ft overall using B UE with several rest breaks in between due to fatigue and limited motivation. Pt required CGA for steering as she tended to deviate toward L. Pt transported dependent in [facility] for remainder of distance to ortho gym. Pt transferred WC <> Nustep via stand pivot using RW with CGA and verbal cueing. Pt utilized Nustep x 10 min on level 1 for B UE and B LE strengthening and activity tolerance. Pt required several 30-60 sec rest breaks due to fatigue. PT attempted to increase resistance to level 2, however, pt complained that it was too difficult. Pt transported dependent in [facility] back to room for time/energy conservation. Pt transferred back to EOB via stand pivot transfer with CGA. Pt then returned to supine with mod A for L LE and CGA otherwise along with max verbal cueing. Pt also required max verbal cues and min A to reposition herself in bed using B UE and B LE. All needs within reach and bed alarm set at end of session.  Therapy Documentation Precautions:  Precautions Precautions: Fall Recall of Precautions/Restrictions: Impaired Precaution/Restrictions Comments: L neglect Restrictions Weight Bearing Restrictions Per Provider Order: No  Therapy/Group: Individual Therapy  [name], DPT 06/08/2024, 7:28 AM

## 2024-06-08 NOTE — Progress Notes (Signed)
 PROGRESS NOTE   Subjective/Complaints: Pt upset about her meds and the timing being off. Wants to be home. Angry about service too.   ROS: Limited due to cognitive/behavioral   Objective:   No results found. Recent Labs    06/06/24 0505  WBC 7.1  HGB 12.6  HCT 38.3  PLT 336    Recent Labs    06/06/24 0505  NA 140  K 4.2  CL 107  CO2 21*  GLUCOSE 100*  BUN 19  CREATININE 1.29*  CALCIUM  8.9     Intake/Output Summary (Last 24 hours) at 06/08/2024 0856 Last data filed at 06/08/2024 0701 Gross per 24 hour  Intake 946 ml  Output --  Net 946 ml         Physical Exam: Vital Signs Blood pressure 105/77, pulse 74, temperature (!) 97.5 F (36.4 C), resp. rate 16, height 5' 1 (1.549 m), weight 74.3 kg, SpO2 97%.  Constitutional: No distress . Vital signs reviewed. HEENT: NCAT, EOMI, oral membranes moist Neck: supple Cardiovascular: RRR without murmur. No JVD    Respiratory/Chest: CTA Bilaterally without wheezes or rales. Normal effort    GI/Abdomen: BS +, non-tender, non-distended Ext: no clubbing, cyanosis, or edema Psych: irritable, anxious  Skin: Many bruises throughout esp LUE distally and R inner thigh  Neuro:  alert and oriented x3, Right gaze preference but can look left.  L hemianopia  -had german measles as child affecting l eye and macular degeneration normal language  MOTOR: RUE and RLE 5/5, other than Ankle DF 4/5- may be effort limited LUE/LLE- 5-/5 throughout  other than Ankle DF 4/5- may be effort limited  No hypertonia noted   Prior neuro assessment is c/w today's exam 06/08/2024.    Assessment/Plan: 1. Functional deficits which require 3+ hours per day of interdisciplinary therapy in a comprehensive inpatient rehab setting. Physiatrist is providing close team supervision and 24 hour management of active medical problems listed below. Physiatrist and rehab team continue to assess  barriers to discharge/monitor patient progress toward functional and medical goals  Care Tool:  Bathing    Body parts bathed by patient: Right arm, Left arm, Chest, Front perineal area, Abdomen, Right upper leg, Left upper leg, Face   Body parts bathed by helper: Buttocks, Right lower leg, Left lower leg     Bathing assist Assist Level: Moderate Assistance - Patient 50 - 74%     Upper Body Dressing/Undressing Upper body dressing   What is the patient wearing?: Dress    Upper body assist Assist Level: Supervision/Verbal cueing    Lower Body Dressing/Undressing Lower body dressing      What is the patient wearing?: Incontinence brief     Lower body assist Assist for lower body dressing: Dependent - Patient 0%     Toileting Toileting    Toileting assist Assist for toileting: Maximal Assistance - Patient 25 - 49%     Transfers Chair/bed transfer  Transfers assist     Chair/bed transfer assist level: Minimal Assistance - Patient > 75%     Locomotion Ambulation   Ambulation assist      Assist level: Minimal Assistance - Patient > 75% Assistive device:  Walker-rolling Max distance: 82 ft   Walk 10 feet activity   Assist     Assist level: Minimal Assistance - Patient > 75% Assistive device: Walker-rolling   Walk 50 feet activity   Assist Walk 50 feet with 2 turns activity did not occur: Safety/medical concerns (weakness/fatigue/pain)  Assist level: Minimal Assistance - Patient > 75% Assistive device: Walker-rolling    Walk 150 feet activity   Assist Walk 150 feet activity did not occur: Safety/medical concerns (weakness/fatigue/pain)         Walk 10 feet on uneven surface  activity   Assist Walk 10 feet on uneven surfaces activity did not occur: Safety/medical concerns (weakness/fatigue/pain)         Wheelchair     Assist Is the patient using a wheelchair?: Yes Type of Wheelchair: Manual Wheelchair activity did not occur:  Refused         Wheelchair 50 feet with 2 turns activity    Assist    Wheelchair 50 feet with 2 turns activity did not occur: Refused       Wheelchair 150 feet activity     Assist  Wheelchair 150 feet activity did not occur: Refused       Blood pressure 105/77, pulse 74, temperature (!) 97.5 F (36.4 C), resp. rate 16, height 5' 1 (1.549 m), weight 74.3 kg, SpO2 97%.  Medical Problem List and Plan: 1. Functional deficits secondary to right PCA large infarct with multiple small scattered infarcts in bilateral hemispheres with R P1 occlusion status post IR but unsuccessful due to tortuosity of vessel             -patient may  shower             -ELOS/Goals: 14-16 days supervision of PT, OT and SLP            -Continue CIR therapies including PT, OT, and SLP. Interdisciplinary team conference today to discuss goals, barriers to discharge, and dc planning.    2.  Antithrombotics: -DVT/anticoagulation:  Pharmaceutical: Eliquis for A flutter             -antiplatelet therapy: N/A 3. Pain Management: Robaxin  500 mg every 8 hours as needed 4. Mood/Behavior/Sleep: Namenda  5 mg twice daily, Remeron  15 mg nightly, Xanax 0.25 mg twice daily as needed             -antipsychotic agents: Seroquel 12.5 mg nightly  -Started lexapro 10mg  daily  -Seen by pysch, continue current regimen  -team provide ego support as possible  -asked nurse to help arrange her medications as she would like them 5. Neuropsych/cognition: This patient is not capable of making decisions on her own behalf due to vascular dementia 6. Skin/Wound Care: Routine skin checks 7. Fluids/Electrolytes/Nutrition: encourage appropriate po 8.  Hypertension.  Continue Norvasc to 10mg  daily. Monitor with increased mobility    06/08/2024    3:59 AM 06/08/2024    3:45 AM 06/08/2024    3:00 AM  Vitals with BMI  Systolic 105 155 844  Diastolic 77 84 84  Pulse 74 64 64   -11/2 add PRN hydralazine  10mg  PRN SBP >160 11/5  bp borderline to controlled--no changes.  9.  Hyperlipidemia.  Lipitor 10.  GERD.  Protonix  11.  Hypothyroidism.  Synthroid  100 mcg daily 12. Hx of german measles of L eye as child and macular degeneration- not clear why cannot see out of L eye- could be old vs hemianopia 13. Vascular dementia- continue namenda   14. Hx  of L THA in 01/2024- and prior L TKA  15. Aflutter- continue Eliquis- doesn't need rate control per Cards  HR controlled   17. CKD3a: creatinine reviewed and has been stable      LOS: 6 days A FACE TO FACE EVALUATION WAS PERFORMED  Arthea ONEIDA Gunther 06/08/2024, 8:56 AM

## 2024-06-08 NOTE — Plan of Care (Signed)
  Problem: RH BOWEL ELIMINATION Goal: RH STG MANAGE BOWEL WITH ASSISTANCE Description: STG Manage Bowel with mod I Assistance. Outcome: Progressing Goal: RH STG MANAGE BOWEL W/MEDICATION W/ASSISTANCE Description: STG Manage Bowel with Medication with mod I Assistance. Outcome: Progressing   Problem: RH BLADDER ELIMINATION Goal: RH STG MANAGE BLADDER WITH ASSISTANCE Description: STG Manage Bladder With toileting Assistance Outcome: Progressing   Problem: RH SAFETY Goal: RH STG ADHERE TO SAFETY PRECAUTIONS W/ASSISTANCE/DEVICE Description: STG Adhere to Safety Precautions With cues Assistance/Device. Outcome: Progressing   Problem: RH PAIN MANAGEMENT Goal: RH STG PAIN MANAGED AT OR BELOW PT'S PAIN GOAL Description: Pain < 4 with prns Outcome: Progressing

## 2024-06-08 NOTE — Progress Notes (Signed)
 Patient ID: Donna Petty, female   DOB: 01/12/1936, 88 y.o.   MRN: [id number]  Have reviewed team conference with pt and family. Both aware and agreeable with targeted d/c date of 11/12 and goals of Supervision/Verbal cueing.  Set up with [facility] - can reinstate for PT/OT. Will set up family education with daughter

## 2024-06-08 NOTE — Progress Notes (Signed)
 Occupational Therapy Session Note  Patient Details  Name: Donna Petty MRN: [id number] Date of Birth: 04-07-1936  Session 1: Today's Date: 06/08/2024 OT Individual Time: 9167-9066 OT Individual Time Calculation (min): 61 min   Session 2: Today's Date: 06/08/2024 OT Individual Time: 8653-8569 OT Individual Time Calculation (min): 44 min   Short Term Goals: Week 1:  OT Short Term Goal 1 (Week 1): patient will complete toileting with mod A OT Short Term Goal 2 (Week 1): patient will complete LB dressing with mod A OT Short Term Goal 3 (Week 1): patient will complete standing during functional activities with min A for 45+ seconds   Session 1: Skilled Therapeutic Interventions/Progress Updates:  Patient agreeable to participate in OT session. Reports no pain level.   Patient participated in skilled OT session focusing on self care, dynamic balance, UE strength. Patient received in bed. Required max verbal encouragement during entirety of session due to some non compliance and aggravation. Patient bed mobility and wc transfer CG to SUP. Patient then able to complete grooming, and UB washing/ dressing with SU and max verbal cues for activity completion. Patient then max A for LB bathing today, with min to mod for LB dressing. Patient CG for all standing and transfers today. Patient then transported to therapy gym completed dynamic balance activities with  RW. Patient completed functional mobility 20 ft x2 focusing on visual attention and scanning to L side to avoid cones. Patient able to complete with good skill. Patient then completed UE strengthening with yellow theraband 2x10 for circuit including horizontal abduction, elbow flexion, elbow extension. Patient then completed functional mobility 60 ft back towards room with CG to SUP before requesting chair. Patient returned to bed SUP A transfer with all needs in reach alarm on.    Session 2: Skilled Therapeutic Interventions/Progress Updates:   Patient agreeable to participate in OT session. Reports no pain level. Patient received in bed with decreased mood. Patient reported she  wants to go to sleep and never wake up to OT. Reported to nursing and PA for psych consult. Patient able to complete transfer with CGA with RW. Attempted to use NuSTep however patient reported that she needs to use bathroom. Transported back to bathroom and completed toileting 3/3 mod to max A. Patient able to complete dynamic balance for standing for clothing balance with good skill one hand supported. Patient then able to complete functional mobility 80 ft with RW. Patient able returned to room with chair alarm on all needs in reach.    Therapy Documentation Precautions:  Precautions Precautions: Fall Recall of Precautions/Restrictions: Impaired Precaution/Restrictions Comments: L neglect Restrictions Weight Bearing Restrictions Per Provider Order: No  Therapy/Group: Individual Therapy  D'mariea L [place] Mielke 06/08/2024, 7:14 AM

## 2024-06-08 NOTE — Progress Notes (Signed)
 Occupational Therapist participated in the interdisciplinary team conference, providing clinical information regarding the patient's current status, treatment goals, and weekly focus, including any barriers that need to be addressed. Please see the Inpatient Rehabilitation Team Conference and Plan of Care Update for further details.   Donna Petty OTR/L

## 2024-06-08 NOTE — Patient Care Conference (Signed)
 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 Prentice Compton Social Worker Present: Waverly Gentry, LCSW-A Nurse Present: Barnie Ronde, RN PT Present: Izetta Seip, PT OT Present: Michaelyn Seip, OT SLP Present: Rosina Downy, SLP     Current Status/Progress Goal Weekly Team Focus  Bowel/Bladder     Incontinent of bladder at times     Incontinence managed with min assist    Toileting protocol  Swallow/Nutrition/ Hydration               ADL's   CG to mod depending transfers, toileitng max, LB dressing mod,   SUP to CG   L side innatention, ADL completion, participation in toileitng, balance    Mobility   Bed mob- CGA/min A; transfers (CGA to mod A) min A overall; amb 188 ft RW min A/CGA   supervison  DC 11/12; HHPT; Barriers- self-limiting behaviors, cognition, L inattention/neglect; Working on ambulation RW, stairs, activity tolerance, attention to L side; DME TBD    Communication                Safety/Cognition/ Behavioral Observations  mod cognitive deficits vs baseline vs participation   minA   problem solving, sustained attention to the L, STM/LTM, intellectual awareness    Pain      N/a           Skin      N/a           Discharge Planning:  Plans to discharge back home with daughter, Donna Petty (guardian) who will provide care. Had HH with [facility]...will plan to reinstate. Has a RW and rollator. Will set up family education for daughter.   Team Discussion: Patient admitted post right PCA CVA cognitive deficits and premorbid dementia.  Progress impaired by self limiting behaviors, reluctance to participate in therapy sessions and moderate cognitive deficits.   Patient on target to meet rehab goals: Yes, currently needs set up for upper body care and cues to stay on tasks. Able to ambulate up to 188' using a RW with CGA.   *See Care Plan and progress notes for long and short-term goals.   Revisions to Treatment Plan:  N/a   Teaching Needs: Safety, medications, dietary modification, transfers, toileting, etc.   Current Barriers to Discharge: Decreased caregiver support, Home enviroment access/layout, and Behavior  Possible Resolutions to Barriers: Family education HH follow up services     Medical Summary Current Status: right PCA infarct, HTN, CKD III, a flutter, insomnia, depression  Barriers to Discharge: Medical stability;Behavior/Mood   Possible Resolutions to [employer]: lexapro added, neuropsych consult, daily assessment of labs and pt data   Continued Need for Acute Rehabilitation Level of Care: The patient requires daily medical management by a  physician with specialized training in physical medicine and rehabilitation for the following reasons: Direction of a multidisciplinary physical rehabilitation program to maximize functional independence : Yes Medical management of patient stability for increased activity during participation in an intensive rehabilitation regime.: Yes Analysis of laboratory values and/or radiology reports with any subsequent need for medication adjustment and/or medical intervention. : Yes   I attest that I was present,  lead the team conference, and concur with the assessment and plan of the team.   Fredericka Sober B 06/08/2024, 3:51 PM

## 2024-06-08 NOTE — Progress Notes (Signed)
 Physical Therapist participated in the interdisciplinary team conference, providing clinical information regarding the patient's current status, treatment goals, and weekly focus, including any barriers that need to be addressed. Please see the Inpatient Rehabilitation Team Conference and Plan of Care Update for further details.     Comer Pines, DPT

## 2024-06-09 LAB — CBC
HCT: 39.1 % (ref 36.0–46.0)
Hemoglobin: 13 g/dL (ref 12.0–15.0)
MCH: 30.5 pg (ref 26.0–34.0)
MCHC: 33.2 g/dL (ref 30.0–36.0)
MCV: 91.8 fL (ref 80.0–100.0)
Platelets: 346 K/uL (ref 150–400)
RBC: 4.26 MIL/uL (ref 3.87–5.11)
RDW: 15.3 % (ref 11.5–15.5)
WBC: 7.3 K/uL (ref 4.0–10.5)
nRBC: 0 % (ref 0.0–0.2)

## 2024-06-09 LAB — BASIC METABOLIC PANEL WITH GFR
Anion gap: 10 (ref 5–15)
BUN: 18 mg/dL (ref 8–23)
CO2: 24 mmol/L (ref 22–32)
Calcium: 9 mg/dL (ref 8.9–10.3)
Chloride: 107 mmol/L (ref 98–111)
Creatinine, Ser: 1.27 mg/dL — ABNORMAL HIGH (ref 0.44–1.00)
GFR, Estimated: 41 mL/min — ABNORMAL LOW (ref >60)
Glucose, Bld: 91 mg/dL (ref 70–99)
Potassium: 4.4 mmol/L (ref 3.5–5.1)
Sodium: 141 mmol/L (ref 135–145)

## 2024-06-09 NOTE — Progress Notes (Signed)
 Occupational Therapy Session Note  Patient Details  Name: MCKINZEE SPIRITO MRN: [id number] Date of Birth: 10-12-1935  Today's Date: 06/09/2024 OT Individual Time: 1300-1356 OT Individual Time Calculation (min): 56 min    Short Term Goals: Week 1:  OT Short Term Goal 1 (Week 1): patient will complete toileting with mod A OT Short Term Goal 2 (Week 1): patient will complete LB dressing with mod A OT Short Term Goal 3 (Week 1): patient will complete standing during functional activities with min A for 45+ seconds  Skilled Therapeutic Interventions/Progress Updates:     Pt received resting in bed presenting to be tired with MOD motivation required to initiate participation in skilled OT session. Pt reporting 0/10 pain- OT offering intermittent rest breaks, repositioning, and therapeutic support to optimize participation in therapy session. Pt's DTR present at beginning of session inquiring about Pt's progress with education provided on Pt's current functional status, estimated d/c date of 11/12 at overall SUP level and follow-up OT/PT services. Pt's DTR receptive to education and plan, however expressing concerns that she will not be able to provide any physical assistance at d/c. Educated DTR that d/c date is flexible and Pt's LOS can be extended if she does not progress at quickly as possible to ensure safety at home. Pt declining to complete bath/dressing ADLs, requesting to use restroom. She transitioned to EOB with HOB elevated with CGA. She completed functional mobility EOB > elevated toilet seat > wc using RW with MIN A for balance and MOD verbal cues for head/trunk positioning 2/2 forward flexed posture. She was able to completed clothing management during toileting with MIN A to fully bring pants over waist and peri-care completed MAX A in standing position 2/2 to fatigue and decreased balance during posterior reaching- continent B&B documented in flowsheets. Remainder of session focused on  assisting Pt with hair wash at sink to improve moral and overall participation in therapy sessions. During hair washing, engaged Pt in light hearted conversation discussing her interests and daily routines to develop rapport with Pt with noted improvement in moral noted following. Pt fatigued at end of session, requesting to return to bed. Stand pivot wc > EOB using RW MIN A. EOB > supine MAX A. Pt was left resting in bed with call bell in reach, bed alarm on, and all needs met.    Therapy Documentation Precautions:  Precautions Precautions: Fall Recall of Precautions/Restrictions: Impaired Precaution/Restrictions Comments: L neglect Restrictions Weight Bearing Restrictions Per Provider Order: No   Therapy/Group: Individual Therapy  Katheryn SHAUNNA [place]ines 06/09/2024, 1:44 PM

## 2024-06-09 NOTE — Progress Notes (Signed)
 Patient ID: Donna Petty, female   DOB: [birth year], 88 y.o.   MRN: [id number]  Was able to speak to [facility] regarding discharge plan. She is aware of targeted discharge date on 11/12 considering patient's progress and goals of Supervision/Verbal cueing.  Will be present for sessions tomorrow beginning at 1pm.

## 2024-06-09 NOTE — Consult Note (Signed)
 [facility] Psychiatric Consult Initial  Patient Name: .Donna Petty  MRN: [id number]  DOB: 03-08-1936  Consult Order details:  Orders (From admission, onward)     Start     Ordered   06/08/24 1410  IP CONSULT TO PSYCHIATRY       Comments: Suicidal ideations patient making comments to staff about offing herself  Ordering Provider: Pegge Toribio PARAS, PA-C  Provider:  (Not yet assigned)  Question Answer Comment  Location [facility]   Reason for Consult? Suicidal ideations      06/08/24 1410   06/04/24 1256  IP CONSULT TO PSYCHIATRY       Ordering Provider: Urbano Albright, MD  Provider:  (Not yet assigned)  Question Answer Comment  Location [facility]   Reason for Consult? depression      06/04/24 1256             Mode of Visit: In person    Psychiatry Consult Evaluation  Service Date: June 09, 2024 LOS:  LOS: 7 days  Chief Complaint  I just want to die already   Primary Psychiatric Diagnoses  Adjustment disorder with depressed mood  Assessment  Donna Petty is a 88 y.o. female admitted: Medicallyfor 06/02/2024  4:08 PM for stroke requiring transfer to acute inpatient rehab due to impairment in physical mobility. She carries the psychiatric diagnoses of MDD and insomnia. She has past medical history history of depression, hypothyroidism, left ICA stenosis 50-70%, hyperlipidemia, memory deficits /vascular dementia maintained on Namenda , GERD, macular degeneration.   Patient historically has been experiencing depression for much of her life per her daughter.  Since hospitalization for stroke this month and substantial decline in her physical functioning and independence, patient has had worsening depression and is experiencing suicidal thoughts and has been agitated during hospitalization.  I suspect that her agitation and suicidal thoughts are a cry for help from patient that the care she is receiving is inconsistent with her  wants and needs.  Patient clearly states that she does not want to be in the hospital and wants to be at home.  Furthermore she states aspects of the hospital including blood draws, her diet with no salt, and relying on nurses for her support has led to her having thoughts to be dead.  Patient's wishes to not want this level of medical care and wanting to allow that and actually appear less consistent with depression that needs psychiatric support and pharmacologic treatment and more consistent with the need for further goals of care discussion that are more in alignment with patient's goals, which are difficult to align with given daughter is legal guardian has differing goals than patient on the scope of care.  Mother was agreeable to this plan, and repeat palliative care consult has been placed.   Psychiatrically, patient has been on mirtazapine  previously for depression/anxiety/sleep and during hospitalization has had addition of Lexapro, Xanax-->Ativan as needed, and low-dose Seroquel at bedtime.  Given patient has not required benzodiazepine, we recommend discontinuing this as they should generally be avoided in older individuals unless medically necessary.  Seroquel at bedtime seems convenient given the challenging sleep environment of the hospital but would recommend discontinuing this either now or at discharge unless there is a clear benefit.  We will transition Seroquel to more tolerable option low-dose doxepin to help with sleep maintenance since mirtazapine  would help with sleep onset moreover.  Regarding suicidal thoughts, we feel that palliative care consult would be most  helpful in addressing these concerns.  Realistically patient has decreased mobility and some cognitive impairment and does not appear to manage her medications so have a low suspicion that she would act on these thoughts in a way that we can avoid.  Best plan forward would be to continue to ensure patient does not have access to  lethal means such as medications, firearms, etc.   Diagnoses:  Active Hospital problems: Principal Problem:   Acute ischemic right posterior cerebral artery (PCA) stroke (HCC) Active Problems:   Adjustment disorder with depressed mood   Stage 3a chronic kidney disease (HCC)    Plan   ## Psychiatric Medication Recommendations:  Discontinue Ativan as patient has not required and to avoid benzodiazepines in older folks PENDING GUARDIAN CONSENT--Discontinue Seroquel in favor of doxepin for sleep maintenance and to avoid antipsychotics given risk of CVA in eldery  PENDING GUARDIAN CONSENT--Start doxepin (generic) 10 mg once daily at bedtime for sleep maintenance  Continue mirtazapine  15 mg once daily at bedtime for sleep onset, mood/anxiety  ## Medical Decision Making Capacity: Patient has a guardian and has thus been adjudicated incompetent; please involve patients guardian in medical decision making.   ## Further Work-up:  -- no recommendations -- most recent EKG on 05/26/2024 had QtC of 434 -- Pertinent labwork reviewed earlier this admission includes: normal TSH, lipid panel HDL 40 trigylceride 167, ethanol negative   ## Disposition:-- There are no psychiatric contraindications to discharge at this time. Suicidal thoughts are without plan or intent and are passive.   ## Behavioral / Environmental:  Delirium precautions Providing patient with encouragement and empathy Reconsult palliative care for reassessment of goals of care conversation including scope of comfort care    ## Safety and Observation Level:  - Based on my clinical evaluation, I estimate the patient to be at low risk of self harm in the current setting. - At this time, we recommend  routine. This decision is based on my review of the chart including patient's history and current presentation, interview of the patient, mental status examination, and consideration of suicide risk including evaluating suicidal ideation,  plan, intent, suicidal or self-harm behaviors, risk factors, and protective factors. This judgment is based on our ability to directly address suicide risk, implement suicide prevention strategies, and develop a safety plan while the patient is in the clinical setting. Please contact our team if there is a concern that risk level has changed.  CSSR Risk Category:C-SSRS RISK CATEGORY: Low Risk  Suicide Risk Assessment: Patient has following modifiable risk factors for suicide: triggering events and pain, medical illness (ie new dx of cancer), which we are addressing by encouraging continued conversation about patient's goals of care including prompting another palliative care consult for comfort care discussion and by starting this conversation with daughter personally this morning. Patient has following non-modifiable or demographic risk factors for suicide: None Patient has the following protective factors against suicide: Supportive family  Thank you for this consult request. Recommendations have been communicated to the primary team.  We will continue to follow and consent mother for medications above at this time.   Justino Cornish, MD       History of Present Illness  Relevant Aspects of [facility] Course:  Admitted on 06/02/2024 for right PCA large infarct with multiple small scattered infarcts in bilateral hemispheres with right P1 occlusion status post IR but unsuccessful due to tortuosity of vessel. They was medically stabilized and transferred to acute inpatient rehab.  Patient Report:  Patient reports that she wants to live anymore.  Reports that being the hospital is like being in hell.  She reports that the food is unsalted and that she does not like the way taste.  She reports is like being in prison.  [place] said that the food was pig slop.  Says that she just wants to go home.  Says that she does not like them coming in the morning and grabbing her arms and legs and poking  her needles.  Says that she pushes the button for the nurses when she needs to pee and that they do not come quick enough and it is really hard for her to hold it.  Reports that she wants denied but denies any intent or plan to actively harm herself.  Psych ROS:  Limited by patient's cognitive functioning.    Collateral, [name], daughter / legal guardian, [phone]: Daughter reports that she will be at the hospital closer to noon. Today discussed concerns with daughter about patient not wanting current scope of care, including quotes from patient about not wanting blood draws, the food being without salt, and not wanting to be in a hospital. Daughter agreeds to having another palliative care conversation around patient's GOC including comfort care discussion. Daughter requested that she receive an update from rehab team on their Plan of Care meeting.   Review of Systems  Reason unable to perform ROS: would not respond to these questions.  Constitutional:  Negative for fever.  Cardiovascular:  Negative for chest pain and palpitations.  Gastrointestinal:  Negative for constipation, diarrhea, nausea and vomiting.  Neurological:  Negative for dizziness, weakness and headaches.     Psychiatric and Social History  Psychiatric History:  Information collected from patient and daughter/legal guardian  Prev Dx/Sx: Adjustment disorder, MDD Current Psych Provider: Unknown probably PCP Home Meds (current): Prior to admission mirtazapine  15 mg once daily, started on escitalopram 10 mg in hospital, started on Xanax in hospital and transition to Ativan as needed in hospital for agitation, started on Seroquel 12.5 mg for agitation and then for sleep as needed in the hospital Previous Med Trials: Unknown Therapy: No recent  Prior Psych Hospitalization: No Prior Self Harm: No Prior Violence: No  Family Psych History: No Family Hx suicide: No  Social History:  Developmental Hx: Did not  discuss Educational Hx: Did not discuss Occupational Hx: Did not discuss Legal Hx: No Living Situation: Living with daughter who is legal guardian in [place] Spiritual Hx: Did not discuss Access to weapons/lethal means: Locked weapons at home out of access of patient.  Patient does not have access to her medications  Substance History Patient does not use substances currently including alcohol, nicotine, cannabis, other substances.  Patient is not has history of substantial substance use.  Exam Findings  Physical Exam:  Vital Signs:  Temp:  [98.4 F (36.9 C)-98.5 F (36.9 C)] 98.5 F (36.9 C) (11/06 1403) Pulse Rate:  [41-77] 77 (11/06 1403) Resp:  [16-18] 18 (11/06 1403) BP: (128-160)/(47-71) 155/65 (11/06 1403) SpO2:  [93 %-100 %] 100 % (11/06 1403) Blood pressure (!) 155/65, pulse 77, temperature 98.5 F (36.9 C), resp. rate 18, height 5' 1 (1.549 m), weight 74.3 kg, SpO2 100%. Body mass index is 30.95 kg/m.  Physical Exam Vitals and nursing note reviewed.  Pulmonary:     Effort: Pulmonary effort is normal.  Neurological:     Mental Status: She is alert.     Mental Status Exam: General Appearance: Laying in bed,  older frail lady, no one else present in the room, grooming appropriate to situation  Orientation: Did not assess  Memory: Poor memory overall but memory intact enough to have conversation about past events during hospital including how the nurses have interacted with her, the food, etc.  Concentration: Fair  Recall: Poor  Attention fair  Eye Contact:  Minimal  Speech:  Slow, clear understandable  Language:  Fair  Volume:  Decreased  Mood: Upset about medical care  Affect:  Depressed  Thought Process:  Coherent and Linear  Thought Content:  Logical  Suicidal Thoughts: Passive suicidal thoughts.  Homicidal Thoughts:  No  Judgement:  Impaired  Insight: Poor  Psychomotor Activity:  Decreased  Akathisia:  No  Fund of Knowledge:  Poor      Assets:   Housing Social Support  Cognition:  Impaired,  Moderate  ADL's:  Impaired  AIMS (if indicated):        Other History   These have been pulled in through the EMR, reviewed, and updated if appropriate.  Family History:  The patient's family history includes Hypertension in her father.  Medical History: Past Medical History:  Diagnosis Date   Acid reflux    Arthritis    Carotid artery disease    50-70% left internal carotid artery stenosis   Depression    Dyspnea    Hypothyroidism    Macular degeneration    Thyroid  condition    Visual changes     Surgical History: Past Surgical History:  Procedure Laterality Date   CATARACT EXTRACTION, BILATERAL     CHOLECYSTECTOMY     COLONOSCOPY     ESOPHAGOGASTRODUODENOSCOPY ENDOSCOPY     IR ANGIO VERTEBRAL SEL SUBCLAVIAN INNOMINATE UNI R MOD SED  05/24/2024   IR PERCUTANEOUS ART THROMBECTOMY/INFUSION INTRACRANIAL INC DIAG ANGIO  05/24/2024   IR US  GUIDE VASC ACCESS RIGHT  05/24/2024   RADIOLOGY WITH ANESTHESIA N/A 05/24/2024   Procedure: RADIOLOGY WITH ANESTHESIA;  Surgeon: Radiologist, Medication, MD;  Location: [facility] OR;  Service: Radiology;  Laterality: N/A;   TOTAL HIP ARTHROPLASTY Left 01/26/2024   Procedure: ARTHROPLASTY, HIP, TOTAL, ANTERIOR APPROACH;  Surgeon: Ernie Cough, MD;  Location: [facility];  Service: Orthopedics;  Laterality: Left;   TOTAL KNEE ARTHROPLASTY Left 06/26/2020   Procedure: TOTAL KNEE ARTHROPLASTY;  Surgeon: Ernie Cough, MD;  Location: [facility];  Service: Orthopedics;  Laterality: Left;  70 mins     Medications:   Current Facility-Administered Medications:    acetaminophen  (TYLENOL ) tablet 650 mg, 650 mg, Oral, Q4H PRN, 650 mg at 06/02/24 2210 **OR** acetaminophen  (TYLENOL ) 160 MG/5ML solution 650 mg, 650 mg, Per Tube, Q4H PRN **OR** acetaminophen  (TYLENOL ) suppository 650 mg, 650 mg, Rectal, Q4H PRN, Angiulli, Daniel J, PA-C   amLODipine (NORVASC) tablet 10 mg, 10 mg, Oral, Daily, Urbano Albright, MD, 10 mg  at 06/09/24 1053   apixaban (ELIQUIS) tablet 5 mg, 5 mg, Oral, BID, Angiulli, Daniel J, PA-C, 5 mg at 06/09/24 1053   atorvastatin (LIPITOR) tablet 20 mg, 20 mg, Oral, Q supper, Swartz, Zachary T, MD   calcium  carbonate (TUMS - dosed in mg elemental calcium ) chewable tablet 200 mg of elemental calcium , 1 tablet, Oral, TID PRN, Urbano Albright, MD, 200 mg of elemental calcium  at 06/08/24 1952   escitalopram (LEXAPRO) tablet 10 mg, 10 mg, Oral, Daily, Urbano Albright, MD, 10 mg at 06/09/24 1053   feeding supplement (ENSURE PLUS HIGH PROTEIN) liquid 237 mL, 237 mL, Oral, BID BM, Angiulli, Daniel J, PA-C, 237 mL at  06/09/24 1057   hydrALAZINE  (APRESOLINE ) tablet 10 mg, 10 mg, Oral, Q6H PRN, Urbano Albright, MD   levothyroxine  (SYNTHROID ) tablet 100 mcg, 100 mcg, Oral, Q0600, Angiulli, Daniel J, PA-C, 100 mcg at 06/09/24 9460   memantine  (NAMENDA ) tablet 5 mg, 5 mg, Oral, BID, Angiulli, Daniel J, PA-C, 5 mg at 06/09/24 1053   methocarbamol  (ROBAXIN ) tablet 500 mg, 500 mg, Oral, Q8H PRN, Angiulli, Daniel J, PA-C, 500 mg at 06/02/24 2209   mirtazapine  (REMERON ) tablet 15 mg, 15 mg, Oral, QHS, Angiulli, Daniel J, PA-C, 15 mg at 06/08/24 1952   pantoprazole  (PROTONIX ) EC tablet 40 mg, 40 mg, Oral, Q breakfast, Swartz, Zachary T, MD, 40 mg at 06/09/24 0542   polyethylene glycol (MIRALAX  / GLYCOLAX ) packet 17 g, 17 g, Oral, BID, Angiulli, Daniel J, PA-C, 17 g at 06/09/24 1057   QUEtiapine (SEROQUEL) tablet 12.5 mg, 12.5 mg, Oral, QHS PRN, Jacquetta Sharlot GRADE, NP, 12.5 mg at 06/06/24 2123   senna-docusate (Senokot-S) tablet 1 tablet, 1 tablet, Oral, QHS PRN, Angiulli, Toribio PARAS, PA-C  Allergies: No Known Allergies  Justino Cornish, MD

## 2024-06-09 NOTE — Progress Notes (Signed)
 PROGRESS NOTE   Subjective/Complaints: Pt in better spirits today, but still wants to go home. Taking a break with OT when I came in. Meds still aren't quite right  ROS: limited d/t cog,behavior  Objective:   No results found. Recent Labs    06/09/24 0502  WBC 7.3  HGB 13.0  HCT 39.1  PLT 346    Recent Labs    06/09/24 0502  NA 141  K 4.4  CL 107  CO2 24  GLUCOSE 91  BUN 18  CREATININE 1.27*  CALCIUM  9.0     Intake/Output Summary (Last 24 hours) at 06/09/2024 0958 Last data filed at 06/09/2024 0700 Gross per 24 hour  Intake 712 ml  Output --  Net 712 ml         Physical Exam: Vital Signs Blood pressure (!) 155/50, pulse 72, temperature 98.4 F (36.9 C), temperature source Oral, resp. rate 18, height 5' 1 (1.549 m), weight 74.3 kg, SpO2 93%.  Constitutional: No distress . Vital signs reviewed. HEENT: NCAT, EOMI, oral membranes moist Neck: supple Cardiovascular: RRR without murmur. No JVD    Respiratory/Chest: CTA Bilaterally without wheezes or rales. Normal effort    GI/Abdomen: BS +, non-tender, non-distended Ext: no clubbing, cyanosis, or edema Psych: flat, distant Skin: Many bruises throughout esp LUE distally and R inner thigh  Neuro:  alert and oriented x3, Right gaze preference but can look left.  L hemianopia  -had german measles as child affecting l eye and macular degeneration normal language  MOTOR: RUE and RLE 5/5, other than Ankle DF 4/5- may be effort limited LUE/LLE- 5-/5 throughout  other than Ankle DF 4/5- may be effort limited  No hypertonia noted   Prior neuro assessment is c/w today's exam 06/09/2024.    Assessment/Plan: 1. Functional deficits which require 3+ hours per day of interdisciplinary therapy in a comprehensive inpatient rehab setting. Physiatrist is providing close team supervision and 24 hour management of active medical problems listed below. Physiatrist and  rehab team continue to assess barriers to discharge/monitor patient progress toward functional and medical goals  Care Tool:  Bathing    Body parts bathed by patient: Right arm, Left arm, Chest, Front perineal area, Abdomen, Right upper leg, Left upper leg, Face   Body parts bathed by helper: Buttocks, Right lower leg, Left lower leg     Bathing assist Assist Level: Moderate Assistance - Patient 50 - 74%     Upper Body Dressing/Undressing Upper body dressing   What is the patient wearing?: Dress    Upper body assist Assist Level: Supervision/Verbal cueing    Lower Body Dressing/Undressing Lower body dressing      What is the patient wearing?: Incontinence brief     Lower body assist Assist for lower body dressing: Dependent - Patient 0%     Toileting Toileting    Toileting assist Assist for toileting: Maximal Assistance - Patient 25 - 49%     Transfers Chair/bed transfer  Transfers assist     Chair/bed transfer assist level: Moderate Assistance - Patient 50 - 74%     Locomotion Ambulation   Ambulation assist      Assist level: Minimal  Assistance - Patient > 75% Assistive device: Walker-rolling Max distance: 108   Walk 10 feet activity   Assist     Assist level: Minimal Assistance - Patient > 75% Assistive device: Walker-rolling   Walk 50 feet activity   Assist Walk 50 feet with 2 turns activity did not occur: Safety/medical concerns (weakness/fatigue/pain)  Assist level: Minimal Assistance - Patient > 75% Assistive device: Walker-rolling    Walk 150 feet activity   Assist Walk 150 feet activity did not occur: Safety/medical concerns (weakness/fatigue/pain)         Walk 10 feet on uneven surface  activity   Assist Walk 10 feet on uneven surfaces activity did not occur: Safety/medical concerns (weakness/fatigue/pain)         Wheelchair     Assist Is the patient using a wheelchair?: Yes Type of Wheelchair:  Manual Wheelchair activity did not occur: Refused  Wheelchair assist level: Contact Guard/Touching assist Max wheelchair distance: 50 ft    Wheelchair 50 feet with 2 turns activity    Assist    Wheelchair 50 feet with 2 turns activity did not occur: Refused       Wheelchair 150 feet activity     Assist  Wheelchair 150 feet activity did not occur: Refused       Blood pressure (!) 155/50, pulse 72, temperature 98.4 F (36.9 C), temperature source Oral, resp. rate 18, height 5' 1 (1.549 m), weight 74.3 kg, SpO2 93%.  Medical Problem List and Plan: 1. Functional deficits secondary to right PCA large infarct with multiple small scattered infarcts in bilateral hemispheres with R P1 occlusion status post IR but unsuccessful due to tortuosity of vessel             -patient may  shower             -ELOS/Goals: 14-16 days supervision of PT, OT and SLP            -Continue CIR therapies including PT, OT, and SLP   2.  Antithrombotics: -DVT/anticoagulation:  Pharmaceutical: Eliquis for A flutter             -antiplatelet therapy: N/A 3. Pain Management: Robaxin  500 mg every 8 hours as needed 4. Mood/Behavior/Sleep: Namenda  5 mg twice daily, Remeron  15 mg nightly, Xanax 0.25 mg twice daily as needed             -antipsychotic agents: Seroquel 12.5 mg nightly  -Started lexapro 10mg  daily  -followed by psychiatry--saw this am, note pending  -team provide ego support as possible  -have asked nurse to help arrange her medications as she would like them 5. Neuropsych/cognition: This patient is not capable of making decisions on her own behalf due to vascular dementia 6. Skin/Wound Care: Routine skin checks 7. Fluids/Electrolytes/Nutrition: encourage appropriate po 8.  Hypertension.  Continue Norvasc to 10mg  daily. Monitor with increased mobility    06/09/2024    3:22 AM 06/08/2024    8:26 PM 06/08/2024    2:52 PM  Vitals with BMI  Systolic 155 160 859  Diastolic 50 47 71   Pulse 72 74 60   -11/2 add PRN hydralazine  10mg  PRN SBP >160 11/5-6 bp borderline to controlled--no changes.  9.  Hyperlipidemia.  Lipitor 10.  GERD.  Protonix  11.  Hypothyroidism.  Synthroid  100 mcg daily 12. Hx of german measles of L eye as child and macular degeneration- not clear why cannot see out of L eye- could be old vs hemianopia 13. Vascular dementia- continue  namenda   14. Hx of L THA in 01/2024- and prior L TKA  15. Aflutter- continue Eliquis- doesn't need rate control per Cards  HR controlled   17. CKD3a: creatinine reviewed and has been stable      LOS: 7 days A FACE TO FACE EVALUATION WAS PERFORMED  Donna Petty 06/09/2024, 9:58 AM

## 2024-06-09 NOTE — Progress Notes (Signed)
 Physical Therapy Session Note  Patient Details  Name: Donna Petty MRN: [id number] Date of Birth: 1935/11/05  Today's Date: 06/09/2024 PT Individual Time: 1004-1100, 1415-1500 PT Individual Time Calculation (min): 56 min, 45 min  Short Term Goals: Week 1:  PT Short Term Goal 1 (Week 1): Pt will perform supine <> sit transfers with min A PT Short Term Goal 2 (Week 1): Pt will perform sit <> stand transfers with LRAD with min A PT Short Term Goal 3 (Week 1): Pt will ambulate 50 ft using LRAD with min A  Skilled Therapeutic Interventions/Progress Updates:     Treatment 1: Pt supine in bed upon arrival, denies pain, and agreeable to therapy. Session emphasized functional strengthening and activity tolerance/endurance with transfers and standing balance/NMR. Pt requested to use bathroom. Pt performed short ambulatory transfer bed <> BSC over toilet with CGA/min A and verbal cueing through doorway to avoid bumping into L side due to inattention. Pt transported to main gym dependent in [facility] for time/energy conservation. Pt performed sit to stand transfers EOM <> RW with CGA overall with verbal cueing to reach back for surface when sitting. Pt required mod A to L LE for knee extension in standing position. Pt worked on lateral weight shifts in RW with B UE support. Several rest breaks required due to fatigue. Pt encouraged to stand tall, however, pt with significant hip flexion posture in standing at baseline (?). At end of session, pt returned to bed with mod A for time management purposes. Bed alarm on and all needs within reach at end of session.  Treatment 2: Pt supine in bed upon arrival, denies pain, and agreeable to therapy. Session emphasized functional strengthening and activity tolerance/endurance with ambulation and stair negotiation. Pt transported to main gym dependent in [facility] for time/energy conservation. Pt ascended/descended 4-6 inch steps using B HR with heavy mod A. Verbal cueing required  for sequencing and to use step-to pattern due to LE weakness. Greater difficulty and assist required with descent - shakiness noted in B LEs due to weakness. Pt ambulated ~75 ft using RW with CGA overall, min A at times for obstacle negotiation, with WC in tow. Pt cued to communicate with PT when she needs to sit down so that PT can lock brakes on chair before pt sits down - pt was not receptive of this and continued to initiate sitting when she wanted to take breaks without communicating to PT. Pt utilized UBE x 3 min (fwd and bkwd) on level 1 for UE strengthening. At end of session, pt returned to supine in bed with mod A for time management purposes. Bed alarm on and all needs within reach at end of session.  Therapy Documentation Precautions:  Precautions Precautions: Fall Recall of Precautions/Restrictions: Impaired Precaution/Restrictions Comments: L neglect Restrictions Weight Bearing Restrictions Per Provider Order: No  Therapy/Group: Individual Therapy  [name], DPT 06/09/2024, 7:24 AM

## 2024-06-09 NOTE — Plan of Care (Signed)
  Problem: Consults Goal: RH STROKE PATIENT EDUCATION Description: See Patient Education module for education specifics  Outcome: Progressing Goal: Nutrition Consult-if indicated Outcome: Progressing   Problem: RH BOWEL ELIMINATION Goal: RH STG MANAGE BOWEL WITH ASSISTANCE Description: STG Manage Bowel with mod I Assistance. Outcome: Progressing Goal: RH STG MANAGE BOWEL W/MEDICATION W/ASSISTANCE Description: STG Manage Bowel with Medication with mod I Assistance. Outcome: Progressing   Problem: RH BLADDER ELIMINATION Goal: RH STG MANAGE BLADDER WITH ASSISTANCE Description: STG Manage Bladder With toileting Assistance Outcome: Progressing   Problem: RH SAFETY Goal: RH STG ADHERE TO SAFETY PRECAUTIONS W/ASSISTANCE/DEVICE Description: STG Adhere to Safety Precautions With cues Assistance/Device. Outcome: Progressing   Problem: RH PAIN MANAGEMENT Goal: RH STG PAIN MANAGED AT OR BELOW PT'S PAIN GOAL Description: Pain < 4 with prns Outcome: Progressing   Problem: RH Vision Goal: RH LTG Vision (Specify) Outcome: Progressing

## 2024-06-09 NOTE — Progress Notes (Signed)
 Occupational Therapy Session Note  Patient Details  Name: Donna Petty MRN: [id number] Date of Birth: [birth year]  Today's Date: 06/09/2024 OT Individual Time: 0833-0900 OT Individual Time Calculation (min): 27 min    Short Term Goals: Week 1:  OT Short Term Goal 1 (Week 1): patient will complete toileting with mod A OT Short Term Goal 2 (Week 1): patient will complete LB dressing with mod A OT Short Term Goal 3 (Week 1): patient will complete standing during functional activities with min A for 45+ seconds  Skilled Therapeutic Interventions/Progress Updates:      Therapy Documentation Precautions:  Precautions Precautions: Fall Recall of Precautions/Restrictions: Impaired Precaution/Restrictions Comments: L neglect Restrictions Weight Bearing Restrictions Per Provider Order: No General: Pt supine in bed upon OT arrival, agreeable to OT session. Handoff from psych MD.  Pain: no pain reported  Exercises: Pt completed the following exercise circuit in order to improve functional activity, strength and endurance to prepare for ADLs such as bathing. Pt completed the following exercises in supine position with no noted LOB/SOB and 3x10 repetitions on each exercise: -ankle pumps -heel slides -resisted leg extension -straight leg raises -shoulder abduction  Pt supine in bed with bed alarm activated, 2 bed rails up, call light within reach and 4Ps assessed.   Therapy/Group: Individual Therapy  [name] Hoe, OTD, OTR/L 06/09/2024, 9:40 AM

## 2024-06-10 DIAGNOSIS — Z515 Encounter for palliative care: Secondary | ICD-10-CM

## 2024-06-10 DIAGNOSIS — Z7189 Other specified counseling: Secondary | ICD-10-CM

## 2024-06-10 MED ORDER — QUETIAPINE FUMARATE 25 MG PO TABS
12.5000 mg | ORAL_TABLET | Freq: Every evening | ORAL | Status: DC | PRN
  Administered 2024-06-13 – 2024-06-14 (×2): 12.5 mg via ORAL
  Filled 2024-06-10 (×2): qty 1

## 2024-06-10 MED ORDER — DOXEPIN HCL 10 MG PO CAPS
10.0000 mg | ORAL_CAPSULE | Freq: Every day | ORAL | Status: DC
  Administered 2024-06-10 – 2024-06-14 (×5): 10 mg via ORAL
  Filled 2024-06-10 (×6): qty 1

## 2024-06-10 NOTE — Progress Notes (Signed)
 Speech Language Pathology Weekly Progress  Patient Details  Name: Donna Petty MRN: [id number] Date of Birth: Jun 16, 1936  Beginning of progress report period: June 03, 2024 End of progress report period: June 10, 2024   Short Term Goals: Week 1: SLP Short Term Goal 1 (Week 1): Patient will participate in further cognitive linguistic evaluation SLP Short Term Goal 1 - Progress (Week 1): Met SLP Short Term Goal 2 (Week 1): Patient will complete bedside swallow evaluation SLP Short Term Goal 2 - Progress (Week 1): Discontinued (comment) SLP Short Term Goal 3 (Week 1): Patient will recall cognitive and physical changes since admission given mod multimodal A SLP Short Term Goal 3 - Progress (Week 1): Not met SLP Short Term Goal 4 (Week 1): Patient will recall biographical information given mod multimodal A SLP Short Term Goal 4 - Progress (Week 1): Met    New Short Term Goals: Week 2: SLP Short Term Goal 1 (Week 2): STG = LTG due to ELOS  Weekly Progress Updates: Pt has made fair gains and has met 2 of 3 STGs this reporting period due to participation in further cognitive assessment and improved recall of biographical information. Currently, patient continues to require max A for awareness of cognitive changes.  Pt/family eduction ongoing. Pt would benefit from continued ST intervention to maximize cognition in order to maximize functional independence at d/c.    Intensity: Minumum of 1-2 x/day, 30 to 90 minutes Frequency: 3 to 5 out of 7 days Duration/Length of Stay: 11/12 Treatment/Interventions: Cognitive remediation/compensation;Internal/external aids;Cueing hierarchy;Functional tasks;Patient/family education;Therapeutic Activities    Shyler Hamill M.A., CCC-SLP 06/10/2024, 4:05 PM

## 2024-06-10 NOTE — Progress Notes (Signed)
 Physical Therapy Session Note  Patient Details  Name: Donna Petty MRN: [id number] Date of Birth: 03/06/1936  Today's Date: 06/10/2024 PT Individual Time: 0800-0858, 8984-8943 PT Individual Time Calculation (min): 58 min, 41 min  Short Term Goals: Week 1:  PT Short Term Goal 1 (Week 1): Pt will perform supine <> sit transfers with min A PT Short Term Goal 2 (Week 1): Pt will perform sit <> stand transfers with LRAD with min A PT Short Term Goal 3 (Week 1): Pt will ambulate 50 ft using LRAD with min A  Skilled Therapeutic Interventions/Progress Updates:     Treatment 1: Pt supine with HOB elevated upon arrival, denies pain, agreeable to therapy. Pt complained of indigestion pain during session, nursing notified of pt asking for TUMS. Session emphasized functional strengthening and endurance with ambulation, NMR, and stair negotiation. Pt performed supine to sit min A. Pt req total A to thread pants B LE. Pt performed sit <> stand transfers during session with CGA and verbal cueing for UE use for pushing off surface and reaching back for surface with UE. Stand pivot transfers using RW varied during session but overall min A progressing to CGA. Verbal cueing required for safety. Pt amb ~159 ft using RW with CGA overall and verbal cueing to maintain safe RW distance. Attempted to encourage pt to work on asc/desc stairs as she has stairs at home to enter, however, she refused. Pt performed 2x10 alternating toe/foot taps on 6 inch step with B UE and minA/CGA - seated rest break required between sets. Pt performed seated B UE flexion with weighted bar, 1x10 with 2# and 2x10 with 3#. Transported dependent in WC back to room for time management purposes. Pt remained in WC, seatbelt alarm set, and all needs within reach at end of session.  Treatment 2: Pt seated in WC upon arrival. Nursing present due to pt complaint of indigestion pains/burning in chest. Denies pain otherwise. Pt agreeable to work with  therapy for seated exercises. BP 123/53. Pt performed the following seated UE and LE strengthening exercises for carryover into functional tasks such as transfers and ambulation:  - Seated Ankle Pumps - 2 sets - 10 reps - Seated Long Arc Quad - 2 sets - 10 reps - Seated March - Attempted but increased indigestion pain - Seated Hip Abduction without Resistance - 2 sets - 10 reps - Seated Biceps Curl - 2 sets - 10 reps - Seated Shoulder Horizontal Abduction with Resistance - Attempted but increased indigestion pain  Pt requires significant verbal and tactile cueing and encouragement for participation, timing, and rep count. Pt provided pt with illustrated handout for HEP - placed in pt's binder - pt verbalized understanding. Educated pt on importance of improving strength in UE and LE for safe DC home. Pt remained in WC, seatbelt alarm set, and all needs within reach at end of session.  Therapy Documentation Precautions:  Precautions Precautions: Fall Recall of Precautions/Restrictions: Impaired Precaution/Restrictions Comments: L neglect Restrictions Weight Bearing Restrictions Per Provider Order: No  Therapy/Group: Individual Therapy  Donna Petty, DPT 06/10/2024, 7:32 AM

## 2024-06-10 NOTE — Progress Notes (Signed)
 Physical Therapy Session Note  Patient Details  Name: Donna Petty MRN: [id number] Date of Birth: 1935-11-24  Today's Date: 06/10/2024 PT Individual Time: 1335-1435 PT Individual Time Calculation (min): 60 min   Short Term Goals: Week 1:  PT Short Term Goal 1 (Week 1): Pt will perform supine <> sit transfers with min A PT Short Term Goal 2 (Week 1): Pt will perform sit <> stand transfers with LRAD with min A PT Short Term Goal 3 (Week 1): Pt will ambulate 50 ft using LRAD with min A  Skilled Therapeutic Interventions/Progress Updates: Pt presented in w/c with dgt present agreeable to therapy. Pt's daughter requesting for pt to take shower. Discussed differences between PT/OT and due to limited time with pt unable to perform. Advised if pt has OT can do shower tomorrow. Daughter also had some questions/concerns regarding shower entry upon d/c, advised to defer to OT. Noted pt has OT tomorrow with primary therapist and daughter agreeable to come during that time to discuss with OT and pt can practice. Pt then agreeable to perform ambulation in room. Pt stood from w/c with CGA and ambulated 35ft x 4 with RW and CGA. Pt required cues for safely navigating around objects initially and dgt was able to provide verbal cues appropriately. Pt demonstrated adequate safety with RW management during ambulation. Pt's daughter voiced concerns regarding stair management in preparation for d/c. Discussed family education with daughter which had not been set up yet. PTA was able to check schedule and daughter is agreeable to come in Monday 2:00 for PT family ed. Pt completed bed mobility with minA for LE management and was able to reposition in bed performing lateral bridges with increased time. Pt left in bed at end of session with call bell within reach and needs met.      Therapy Documentation Precautions:  Precautions Precautions: Fall Recall of Precautions/Restrictions: Impaired Precaution/Restrictions  Comments: L neglect Restrictions Weight Bearing Restrictions Per Provider Order: No General:   Vital Signs: Therapy Vitals Temp: 98.5 F (36.9 C) Temp Source: Oral Pulse Rate: 82 Resp: 18 BP: (!) 167/71 Patient Position (if appropriate): Lying Oxygen Therapy SpO2: 97 % O2 Device: Room Air Pain: Pain Assessment Pain Scale: 0-10 Pain Score: 0-No pain Mobility:   Locomotion :    Trunk/Postural Assessment :    Balance:   Exercises:   Other Treatments:      Therapy/Group: Individual Therapy  Freddye Cardamone 06/10/2024, 4:22 PM

## 2024-06-10 NOTE — Progress Notes (Signed)
 Occupational Therapy Session Note  Patient Details  Name: Donna Petty MRN: [id number] Date of Birth: January 02, 1936  Today's Date: 06/10/2024 OT Individual Time: 0900-0930 OT Individual Time Calculation (min): 30 min    Short Term Goals: Week 1:  OT Short Term Goal 1 (Week 1): patient will complete toileting with mod A OT Short Term Goal 2 (Week 1): patient will complete LB dressing with mod A OT Short Term Goal 3 (Week 1): patient will complete standing during functional activities with min A for 45+ seconds  Skilled Therapeutic Interventions/Progress Updates:      Therapy Documentation Precautions:  Precautions Precautions: Fall Recall of Precautions/Restrictions: Impaired Precaution/Restrictions Comments: L neglect Restrictions Weight Bearing Restrictions Per Provider Order: No General:  Pt seated in W/C upon OT arrival, agreeable to OT.  Pain: no pain reported  Exercises: Pt completed the following exercise circuit in order to improve functional activity, strength and endurance to prepare for ADLs such as bathing. Pt completed the following exercises in seated position with no noted LOB/SOB and 3x10 repetitions on each exercise: -triceps extensions -shoulder abduction -clam shells -leg press   Pt seated in W/C at end of session with W/C alarm donned, call light within reach and 4Ps assessed.    Therapy/Group: Individual Therapy  [name] Hoe, OTD, OTR/L 06/10/2024, 9:34 AM

## 2024-06-10 NOTE — Progress Notes (Signed)
 Occupational Therapy Weekly Progress Note  Patient Details  Name: Donna Petty MRN: [id number] Date of Birth: 07-13-1936  Beginning of progress report period: June 03, 2024 End of progress report period: June 10, 2024  Today's Date: 06/10/2024 OT Individual Time: 8884-8854 OT Individual Time Calculation (min): 30 min   15 minutes missed due to patient report of acid reflux/ nausea  Patient has met 2 of 3 short term goals.  Is progressing towards greater independence with BADL's, but reports daughter helped with dressing prior.  Patient continues to demonstrate the following deficits: muscle weakness and therefore will continue to benefit from skilled OT intervention to enhance overall performance with BADL.  Patient progressing toward long term goals..  Continue plan of care.  OT Short Term Goals Week 1:  OT Short Term Goal 1 (Week 1): patient will complete toileting with mod A OT Short Term Goal 1 - Progress (Week 1): Met OT Short Term Goal 2 (Week 1): patient will complete LB dressing with mod A OT Short Term Goal 2 - Progress (Week 1): Partly met OT Short Term Goal 3 (Week 1): patient will complete standing during functional activities with min A for 45+ seconds OT Short Term Goal 3 - Progress (Week 1): Met Week 2:  OT Short Term Goal 1 (Week 2): patient will complete LB dressing with Mod assist OT Short Term Goal 2 (Week 2): Patient will perform toilet transfer with close SBA OT Short Term Goal 3 (Week 2): Patient will perform grooming standing at sink with CGA  Skilled Therapeutic Interventions/Progress Updates: Patient agreeable for OT treatment.  Seen for functional transfer training and goal assessment. Patient able to stand to RW with CGA to perform ambulation into bathroom sit on toilet and stand. Patient reports not having far to ambulate for toileting at home.  Patient used RW for simple balance tasks- stood with forward flexed posture but able to reach with Ue's one  at a time for clothing management. Attempted to assess patient's ability to perform LE dressing, but patient reports daughter helps with sock and shoes and would not need to be independent with those things at home.  Patient with increasing report of acid reflux during treatment. Unable to complete last 15 minutes of treatment. Nursing aware of patient report. Will continue with skill OT POC working towards LTG's and safe discharge home.      Therapy Documentation Precautions:  Precautions Precautions: Fall Recall of Precautions/Restrictions: Impaired Precaution/Restrictions Comments: L neglect Restrictions Weight Bearing Restrictions Per Provider Order: No General:   Vital Signs: Therapy Vitals Pulse Rate: 82 BP: (!) 123/52 Pain:   ADL: ADL Equipment Provided: Reacher Eating: Set up Where Assessed-Eating: Wheelchair Grooming: Setup Where Assessed-Grooming: Sitting at sink Upper Body Bathing: Supervision/safety Where Assessed-Upper Body Bathing: Shower Lower Body Bathing: Moderate assistance Where Assessed-Lower Body Bathing: Shower Upper Body Dressing: Setup Where Assessed-Upper Body Dressing: Chair Lower Body Dressing: Maximal assistance Where Assessed-Lower Body Dressing: Chair Toileting: Moderate assistance Where Assessed-Toileting: Teacher, Adult Education: Curator Method: Surveyor, Minerals: Grab bars, Raised toilet seat Film/video Editor: Moderate assistance Film/video Editor Method: Warden/ranger: Emergency planning/management officer     Therapy/Group: Individual Therapy  Donna Petty 06/10/2024, 11:49 AM

## 2024-06-10 NOTE — Progress Notes (Signed)
 PROGRESS NOTE   Subjective/Complaints: Seems to have slept a little better last night. Still upset about being here. Has been working somewhat with therapies.   ROS: Limited due to cognitive/behavioral   Objective:   No results found. Recent Labs    06/09/24 0502  WBC 7.3  HGB 13.0  HCT 39.1  PLT 346    Recent Labs    06/09/24 0502  NA 141  K 4.4  CL 107  CO2 24  GLUCOSE 91  BUN 18  CREATININE 1.27*  CALCIUM  9.0    No intake or output data in the 24 hours ending 06/10/24 0738        Physical Exam: Vital Signs Blood pressure (!) 168/69, pulse 71, temperature 98.3 F (36.8 C), temperature source Oral, resp. rate 18, height 5' 1 (1.549 m), weight 74.3 kg, SpO2 98%.  Constitutional: No distress . Vital signs reviewed. HEENT: NCAT, EOMI, oral membranes moist Neck: supple Cardiovascular: RRR without murmur. No JVD    Respiratory/Chest: CTA Bilaterally without wheezes or rales. Normal effort    GI/Abdomen: BS +, non-tender, non-distended Ext: no clubbing, cyanosis, or edema Psych: anxious, flat at times  Skin: Many bruises throughout esp LUE distally and R inner thigh  Neuro:  alert and oriented x3, Right gaze preference but can look left.  L hemianopia  -had german measles as child affecting l eye and macular degeneration normal language  MOTOR: RUE and RLE 5/5, other than Ankle DF 4/5- may be effort limited LUE/LLE- 5-/5 throughout  other than Ankle DF 4/5- may be effort limited  No hypertonia noted   Prior neuro assessment is c/w today's exam 06/10/2024.    Assessment/Plan: 1. Functional deficits which require 3+ hours per day of interdisciplinary therapy in a comprehensive inpatient rehab setting. Physiatrist is providing close team supervision and 24 hour management of active medical problems listed below. Physiatrist and rehab team continue to assess barriers to discharge/monitor patient  progress toward functional and medical goals  Care Tool:  Bathing    Body parts bathed by patient: Right arm, Left arm, Chest, Front perineal area, Abdomen, Right upper leg, Left upper leg, Face   Body parts bathed by helper: Buttocks, Right lower leg, Left lower leg     Bathing assist Assist Level: Moderate Assistance - Patient 50 - 74%     Upper Body Dressing/Undressing Upper body dressing   What is the patient wearing?: Dress    Upper body assist Assist Level: Supervision/Verbal cueing    Lower Body Dressing/Undressing Lower body dressing      What is the patient wearing?: Incontinence brief     Lower body assist Assist for lower body dressing: Dependent - Patient 0%     Toileting Toileting    Toileting assist Assist for toileting: Maximal Assistance - Patient 25 - 49%     Transfers Chair/bed transfer  Transfers assist     Chair/bed transfer assist level: Moderate Assistance - Patient 50 - 74%     Locomotion Ambulation   Ambulation assist      Assist level: Minimal Assistance - Patient > 75% Assistive device: Walker-rolling Max distance: 108   Walk 10 feet activity  Assist     Assist level: Minimal Assistance - Patient > 75% Assistive device: Walker-rolling   Walk 50 feet activity   Assist Walk 50 feet with 2 turns activity did not occur: Safety/medical concerns (weakness/fatigue/pain)  Assist level: Minimal Assistance - Patient > 75% Assistive device: Walker-rolling    Walk 150 feet activity   Assist Walk 150 feet activity did not occur: Safety/medical concerns (weakness/fatigue/pain)         Walk 10 feet on uneven surface  activity   Assist Walk 10 feet on uneven surfaces activity did not occur: Safety/medical concerns (weakness/fatigue/pain)         Wheelchair     Assist Is the patient using a wheelchair?: Yes Type of Wheelchair: Manual Wheelchair activity did not occur: Refused  Wheelchair assist level:  Contact Guard/Touching assist Max wheelchair distance: 50 ft    Wheelchair 50 feet with 2 turns activity    Assist    Wheelchair 50 feet with 2 turns activity did not occur: Refused       Wheelchair 150 feet activity     Assist  Wheelchair 150 feet activity did not occur: Refused       Blood pressure (!) 168/69, pulse 71, temperature 98.3 F (36.8 C), temperature source Oral, resp. rate 18, height 5' 1 (1.549 m), weight 74.3 kg, SpO2 98%.  Medical Problem List and Plan: 1. 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  Antithrombotics: -DVT/anticoagulation:  Pharmaceutical: Eliquis for A flutter             -antiplatelet therapy: N/A 3. Pain Management: Robaxin  500 mg every 8 hours as needed 4. Mood/Behavior/Sleep: Namenda  5 mg twice daily, Remeron  15 mg nightly, Xanax 0.25 mg twice daily as needed             -antipsychotic agents: seroquel prn--dc  -Started lexapro 10mg  daily  11/7-followed by psychiatry--doxepin suggested for sleep--will start  -team providing ego support as possible 5. Neuropsych/cognition: This patient is not capable of making decisions on her own behalf due to vascular dementia 6. Skin/Wound Care: Routine skin checks 7. Fluids/Electrolytes/Nutrition: encourage appropriate po 8.  Hypertension.  Continue Norvasc to 10mg  daily. Monitor with increased mobility    06/10/2024    3:14 AM 06/09/2024    7:53 PM 06/09/2024    2:03 PM  Vitals with BMI  Systolic 168 147 844  Diastolic 69 57 65  Pulse 71 74 77   -11/2 add PRN hydralazine  10mg  PRN SBP >160 11/7 bp borderline to  controlled--will make no changes presently.  9.  Hyperlipidemia.  Lipitor 10.  GERD.  Protonix  11.  Hypothyroidism.  Synthroid  100 mcg daily 12. Hx of german measles of L eye as child and macular degeneration- not clear why cannot see out of L eye- could be old vs hemianopia 13. Vascular dementia- continue namenda   14. Hx of L THA in 01/2024- and prior L TKA  15. Aflutter- continue Eliquis- doesn't need rate control  per Cards  HR controlled   17. CKD3a: creatinine reviewed and has been stable      LOS: 8 days A FACE TO FACE EVALUATION WAS PERFORMED  Donna Petty 06/10/2024, 7:38 AM

## 2024-06-10 NOTE — Progress Notes (Signed)
 Palliative Medicine Inpatient Follow Up Note   HPI:  88 y.o. female  with past medical history of hypothyroidism, dementia, carotid artery disease, GERD  admitted on 05/24/2024 with left sided weakness. Code stroke was activated, MRI showed a large right acute PCA infarct and conditional scattered small bilateral acute cerebral and cerebellar infarcts. mechanical thrombectomy attempted for right PCA P1 occlusion but unsuccessful due to extreme vertebral artery tortuosity  Patient was manifesting right sided gaze with left hemianopsia and left-sided extinction on arrival to the ED.    The attending team requested a follow-up discussion with the patient and her daughter, Donna Petty, to revisit goals of care. This was prompted by the patient's ongoing verbal expressions of dissatisfaction and frustration about being hospitalized and receiving therapies.  Today's Discussion 06/10/2024  *Please note that this is a verbal dictation therefore any spelling or grammatical errors are due to the [employer] system interpretation.  Chart reviewed inclusive of vital signs, progress notes, laboratory results, and diagnostic images.   On June 09, 2024, the attending team requested a reconsult to revisit goals of care with the patient and her daughter, Donna Petty, due to the patient's ongoing verbal expressions of dissatisfaction about being hospitalized and receiving therapies. The patient is currently enrolled in CIR for physical, occupational, and speech therapy. During my bedside visit, the patient was seated comfortably in a chair without signs of distress and denied pain or discomfort. She expressed anticipation for her upcoming therapy session and stated she was "doing well." When asked about hospitalization and therapy, she remarked, "I guess it has to be like this."   Donna Petty (daughter) was present and reported that the patient has made significant progress, noting improved upper and lower extremity  strength and expressing hope for continued recovery to allow for a safe discharge home. I shared the team's concern that the patient's dissatisfaction with being hospitalized might indicate misalignment with her goals and introduced the option of a comfort-focused care pathway should she continue to express distress about therapy or medical interventions.  Donna Petty explained that the patient tends to be vocal but does not usually mean what she says, describing this as longstanding behavior they have managed for years. She acknowledged prior statements of suicidal ideation but attributed them to attention-seeking rather than intent. Donna Petty added that her mother is "spoiled" and often makes inappropriate remarks, including cursing, but does not truly mean them.  The goals of care remain unchanged. The plan is to continue current medical interventions, including Inpatient Rehabilitation (CIR) for physical, occupational, and speech therapy, with the hope of ongoing clinical improvement and eventual safe discharge home with her daughter. Donna Petty expressed that she is seeing progress and wishes to continue "taking it day by day." She stated that she is not ready for hospice at this time but is open to outpatient palliative care services. I reviewed potential treatment pathways, including comfort-focused care and hospice. While Donna Petty is not currently open to hospice, she is familiar with its services and speaks highly of them, noting positive experiences with other family members in the past.  Created space and opportunity for patient to explore thoughts feelings and fears regarding current medical situation.  Questions and concerns addressed   Palliative Support Provided.   Objective Assessment: Vital Signs Vitals:   06/09/24 1953 06/10/24 0314  BP: (!) 147/57 (!) 168/69  Pulse: 74 71  Resp: 18 18  Temp: 98.2 F (36.8 C) 98.3 F (36.8 C)  SpO2: 97% 98%    Intake/Output  Summary (Last 24 hours) at  06/10/2024 0956 Last data filed at 06/10/2024 0824 Gross per 24 hour  Intake 354 ml  Output --  Net 354 ml   Last Weight  Most recent update: 06/02/2024  5:11 PM    Weight  74.3 kg (163 lb 12.8 oz)             Physical Exam Vitals and nursing note reviewed.  Constitutional:      Appearance: Fatigued-looking HENT:     Head: Normocephalic and atraumatic.  Cardiovascular:     Rate and Rhythm: Normal rate.  Pulmonary:     Effort: Pulmonary effort is normal.  Abdominal:     General: Abdomen is flat.     Palpations: Abdomen is soft.    Musculoskeletal:     Cervical back: Normal range of motion.     Comments: Generalized weakness more observed on the left side.   Skin    General: Skin is warm and dry.  Neurological:     Mental Status: She is alert and oriented to person, place, and time.  Psychiatric:  NAD  SUMMARY OF RECOMMENDATIONS    Code Status: Maintain DNR-Limited. No heroic measures.  Continue current scope of care, including CIR for PT, OT, SLP in hopes for continued progress and eventually discharge home with daughter. Daughter not ready for comfort-focused or hospice approach, but fine with outpatient palliative services on discharge.  Will place [facility] consult for this. Encouraged the daughter to continue discussions with patient and the care team to ensure decisions align with the patient's values and goals.  Continue to provide psycho-social and emotional support to patient and family Palliative medicine team will continue to follow.    Symptom Management: Per Primary team Lexapro 10mg  daily Acetaminophen  PRN for pain Methocarbamol  PRN for muscle spasms Memantine  5mg  BID for cognitive decline Seroquel 12.5mg  PRN at bed time (if Mirtazapine  and Doxepin ineffective for sleep) Mirtazapine  at HS for insomnia Doxepin at HS for insomnia Palliative medicine is available to assist as needed.      Time Spent: 50 minutes  Detailed review of medical records (labs,  imaging, vital signs), medically appropriate exam, discussed with treatment team, counseling and education to patient, family, & staff, documenting clinical information, coordination of care.    ______________________________________________________________________________________ Kathlyne Bolder NP-C [place] [facility] Team Team Cell Phone: [phone] Please utilize secure chat with additional questions, if there is no response within 30 minutes please call the above phone number  Palliative Medicine Team providers are available by phone from 7am to 7pm daily and can be reached through the team cell phone.  Should this patient require assistance outside of these hours, please call the patient's attending physician.

## 2024-06-10 NOTE — Consult Note (Signed)
 [facility]  Patient Name: .Donna Petty  MRN: [id number]  DOB: August 26, 1935  Consult Order details:  Orders (From admission, onward)     Start     Ordered   06/08/24 1410  IP CONSULT TO PSYCHIATRY       Comments: Suicidal ideations patient making comments to staff about offing herself  Ordering Provider: Pegge Toribio PARAS, PA-C  Provider:  (Not yet assigned)  Question Answer Comment  Location [facility]   Reason for Consult? Suicidal ideations      06/08/24 1410   06/04/24 1256  IP CONSULT TO PSYCHIATRY       Ordering Provider: Urbano Albright, MD  Provider:  (Not yet assigned)  Question Answer Comment  Location [facility]   Reason for Consult? depression      06/04/24 1256             Mode of Visit: In person    Psychiatry Consult Evaluation  Service Date: June 10, 2024 LOS:  LOS: 8 days  Chief Complaint  I just want to die already   Primary Psychiatric Diagnoses  Adjustment disorder with depressed mood  Assessment  Donna Petty is a 88 y.o. female admitted: Medicallyfor 06/02/2024  4:08 PM for stroke requiring transfer to acute inpatient rehab due to impairment in physical mobility. She carries the psychiatric diagnoses of MDD and insomnia. She has past medical history history of depression, hypothyroidism, left ICA stenosis 50-70%, hyperlipidemia, memory deficits /vascular dementia maintained on Namenda , GERD, macular degeneration.   Patient historically has been experiencing depression for much of her life per her daughter.  Since hospitalization for stroke this month and substantial decline in her physical functioning and independence, patient has had worsening depression and is experiencing suicidal thoughts and has been agitated during hospitalization.  I suspect that her agitation and suicidal thoughts are a cry for help from patient that the care she is receiving is inconsistent with her  wants and needs.  Patient clearly states that she does not want to be in the hospital and wants to be at home.  Furthermore she states aspects of the hospital including blood draws, her diet with no salt, and relying on nurses for her support has led to her having thoughts to be dead.  Patient's wishes to not want this level of medical care and wanting to allow that and actually appear less consistent with depression that needs psychiatric support and pharmacologic treatment and more consistent with the need for further goals of care discussion that are more in alignment with patient's goals, which are difficult to align with given daughter is legal guardian has differing goals than patient on the scope of care.  Mother was agreeable to this plan, and repeat palliative care consult has been placed.   Psychiatrically, patient has been on mirtazapine  previously for depression/anxiety/sleep and during hospitalization has had addition of Lexapro, Xanax-->Ativan as needed, and low-dose Seroquel at bedtime.  Given patient has not required benzodiazepine, we recommend discontinuing this as they should generally be avoided in older individuals unless medically necessary.  Seroquel at bedtime seems convenient given the challenging sleep environment of the hospital but would recommend discontinuing this either now or at discharge unless there is a clear benefit.  We will transition Seroquel to more tolerable option low-dose doxepin to help with sleep maintenance since mirtazapine  would help with sleep onset moreover.  Regarding suicidal thoughts, we feel that palliative care consult would be most  helpful in addressing these concerns.  Realistically patient has decreased mobility and some cognitive impairment and does not appear to manage her medications so have a low suspicion that she would act on these thoughts in a way that we can avoid.  Best plan forward would be to continue to ensure patient does not have access to  lethal means such as medications, firearms, etc.   06/10/24 Per Gaynell patient's insomnia has only been evident in the hospital and from possible agitation and reports at home that she sleeps well with mirtazapine  alone.  She did not want to proceed with doxepin at this time.  She was agreeable to continuing patient's Seroquel while in the hospital since it seems to be helping but was agreeable to discontinue at discharge.  Educated her on blackbox warning for increased risk of CVA in the elderly and she voiced understanding.  Palliative able to speak with patient today and [name] wanting to continue current scope of care but may pursue outpatient palliative comfort focused measures in future.  Patient has low suicide risk given her lack of plan or intention and given no real ability to access lethal means including firearms or medications.  Psychiatry will sign off at this time.  Diagnoses:  Active Hospital problems: Principal Problem:   Acute ischemic right posterior cerebral artery (PCA) stroke (HCC) Active Problems:   Adjustment disorder with depressed mood   Stage 3a chronic kidney disease (HCC)    Plan   ## Psychiatric Medication Recommendations:  Continue mirtazapine  15 mg once daily at bedtime for sleep onset, mood/anxiety Continue Seroquel 12.5 mg once daily at bedtime as needed for insomnia/agitation while in hospital; please discontinue at discharge  ## Medical Decision Making Capacity: Patient has a guardian and has thus been adjudicated incompetent; please involve patients guardian in medical decision making.   ## Further Work-up:  -- no recommendations -- most recent EKG on 05/26/2024 had QtC of 434 -- Pertinent labwork reviewed earlier this admission includes: normal TSH, lipid panel HDL 40 trigylceride 167, ethanol negative   ## Disposition:-- There are no psychiatric contraindications to discharge at this time. Suicidal thoughts are without plan or intent and are passive.    ## Behavioral / Environmental:  Delirium precautions Providing patient with encouragement and empathy Reconsult palliative care for reassessment of goals of care conversation including scope of comfort care    ## Safety and Observation Level:  - Based on my clinical evaluation, I estimate the patient to be at low risk of self harm in the current setting. - At this time, we recommend  routine. This decision is based on my review of the chart including patient's history and current presentation, interview of the patient, mental status examination, and consideration of suicide risk including evaluating suicidal ideation, plan, intent, suicidal or self-harm behaviors, risk factors, and protective factors. This judgment is based on our ability to directly address suicide risk, implement suicide prevention strategies, and develop a safety plan while the patient is in the clinical setting. Please contact our team if there is a concern that risk level has changed.  CSSR Risk Category:C-SSRS RISK CATEGORY: Low Risk  Suicide Risk Assessment: Patient has following modifiable risk factors for suicide: triggering events and pain, medical illness (ie new dx of cancer), which we are addressing by encouraging continued conversation about patient's goals of care including prompting another palliative care consult for comfort care discussion and by starting this conversation with daughter personally this morning. Patient has following non-modifiable or demographic  risk factors for suicide: None Patient has the following protective factors against suicide: Supportive family  Thank you for this consult request. Recommendations have been communicated to the primary team.  We will sign off at this time.   Justino Cornish, MD       History of Present Illness  Relevant Aspects of [facility] Course:  Admitted on 06/02/2024 for right PCA large infarct with multiple small scattered infarcts in bilateral  hemispheres with right P1 occlusion status post IR but unsuccessful due to tortuosity of vessel. They was medically stabilized and transferred to acute inpatient rehab.  Patient Report:  Patient did not provide any information during interview and was more were silent.  Asked patient several questions and she did not have much response.  Legal guardian reports (in room): Reports that patient has been having insomnia in the past but recently she has been doing better on the mirtazapine  alone.  Reports at home that she sleeps well.  Reports that since being the hospital she had some issues and this led to them starting the Seroquel for agitation and sleep.  Provided information about doxepin as well as Seroquel including the benefits, risk, blackbox warnings.  Overall she was open to continuing current plan but with discontinuing Seroquel at discharge as patient appears to not need this medication at home.  Discussed that if patient was to need support around son in the future that doxepin 10 mg or lower would be a good option for her and is relatively safe as it much more tolerable at low-dose and at the antidepressant dose.  Asked [name] about the palliative care meeting and she did not provide much response and did not seem interested in discussing that with this provider.  Discussed with her my assessment of low suicide risk given the no plan, no intent, and no access to lethal means including firearms and medications.  [name] agreed and did not feel that her mother was unsafe and says that her mother has been having these thoughts for a long time and that they come and go and that they are more for attention and when she is upset with a situation.  Discussed that psychiatry will sign off and she was agreeable to this and understood that she can ask for primary team to reconsult with any concerns.   Review of Systems  Reason unable to perform ROS: Patient not responding to ROS.     Psychiatric and  Social History  Psychiatric History:  Information collected from patient and daughter/legal guardian  Prev Dx/Sx: Adjustment disorder, MDD Current Psych Provider: Unknown probably PCP Home Meds (current): Prior to admission mirtazapine  15 mg once daily, started on escitalopram 10 mg in hospital, started on Xanax in hospital and transition to Ativan as needed in hospital for agitation, started on Seroquel 12.5 mg for agitation and then for sleep as needed in the hospital Previous Med Trials: Unknown Therapy: No recent  Prior Psych Hospitalization: No Prior Self Harm: No Prior Violence: No  Family Psych History: No Family Hx suicide: No  Social History:  Developmental Hx: Did not discuss Educational Hx: Did not discuss Occupational Hx: Did not discuss Legal Hx: No Living Situation: Living with daughter who is legal guardian in [place] Spiritual Hx: Did not discuss Access to weapons/lethal means: Locked weapons at home out of access of patient.  Patient does not have access to her medications  Substance History Patient does not use substances currently including alcohol, nicotine, cannabis, other substances.  Patient  is not has history of substantial substance use.  Exam Findings  Physical Exam:  Vital Signs:  Temp:  [98.2 F (36.8 C)-98.5 F (36.9 C)] 98.5 F (36.9 C) (11/07 1422) Pulse Rate:  [71-82] 82 (11/07 1422) Resp:  [18] 18 (11/07 1422) BP: (123-168)/(52-71) 167/71 (11/07 1422) SpO2:  [97 %-98 %] 97 % (11/07 1422) Blood pressure (!) 167/71, pulse 82, temperature 98.5 F (36.9 C), temperature source Oral, resp. rate 18, height 5' 1 (1.549 m), weight 74.3 kg, SpO2 97%. Body mass index is 30.95 kg/m.  Physical Exam Vitals and nursing note reviewed.  Pulmonary:     Effort: Pulmonary effort is normal.  Neurological:     Mental Status: She is alert.     Mental Status Exam: General Appearance: Laying in bed, older frail lady, no one else present in the room,  grooming appropriate to situation  Orientation: Did not assess  Memory: Did not assess during this encounter  Concentration: Fair  Recall: Poor  Attention fair  Eye Contact:  Minimal  Speech:  Slow, clear understandable  Language:  Fair  Volume:  Decreased  Mood: Upset that the therapist is not here yet  Affect:  Depressed  Thought Process:  Coherent and Linear  Thought Content:  Logical  Suicidal Thoughts: Denies today  Homicidal Thoughts:  No  Judgement:  Impaired  Insight: Poor  Psychomotor Activity:  Decreased  Akathisia:  No  Fund of Knowledge:  Poor      Assets:  Housing Social Support  Cognition:  Impaired,  Moderate  ADL's:  Impaired  AIMS (if indicated):        Other History   These have been pulled in through the EMR, reviewed, and updated if appropriate.  Family History:  The patient's family history includes Hypertension in her father.  Medical History: Past Medical History:  Diagnosis Date   Acid reflux    Arthritis    Carotid artery disease    50-70% left internal carotid artery stenosis   Depression    Dyspnea    Hypothyroidism    Macular degeneration    Thyroid  condition    Visual changes     Surgical History: Past Surgical History:  Procedure Laterality Date   CATARACT EXTRACTION, BILATERAL     CHOLECYSTECTOMY     COLONOSCOPY     ESOPHAGOGASTRODUODENOSCOPY ENDOSCOPY     IR ANGIO VERTEBRAL SEL SUBCLAVIAN INNOMINATE UNI R MOD SED  05/24/2024   IR PERCUTANEOUS ART THROMBECTOMY/INFUSION INTRACRANIAL INC DIAG ANGIO  05/24/2024   IR US  GUIDE VASC ACCESS RIGHT  05/24/2024   RADIOLOGY WITH ANESTHESIA N/A 05/24/2024   Procedure: RADIOLOGY WITH ANESTHESIA;  Surgeon: Radiologist, Medication, MD;  Location: [facility] OR;  Service: Radiology;  Laterality: N/A;   TOTAL HIP ARTHROPLASTY Left 01/26/2024   Procedure: ARTHROPLASTY, HIP, TOTAL, ANTERIOR APPROACH;  Surgeon: Ernie Cough, MD;  Location: [facility];  Service: Orthopedics;  Laterality: Left;   TOTAL  KNEE ARTHROPLASTY Left 06/26/2020   Procedure: TOTAL KNEE ARTHROPLASTY;  Surgeon: Ernie Cough, MD;  Location: [facility];  Service: Orthopedics;  Laterality: Left;  70 mins     Medications:   Current Facility-Administered Medications:    acetaminophen  (TYLENOL ) tablet 650 mg, 650 mg, Oral, Q4H PRN, 650 mg at 06/02/24 2210 **OR** acetaminophen  (TYLENOL ) 160 MG/5ML solution 650 mg, 650 mg, Per Tube, Q4H PRN **OR** acetaminophen  (TYLENOL ) suppository 650 mg, 650 mg, Rectal, Q4H PRN, Angiulli, Daniel J, PA-C   amLODipine (NORVASC) tablet 10 mg, 10 mg, Oral, Daily, Urbano Albright,  MD, 10 mg at 06/10/24 9187   apixaban (ELIQUIS) tablet 5 mg, 5 mg, Oral, BID, Angiulli, Daniel J, PA-C, 5 mg at 06/10/24 9187   atorvastatin (LIPITOR) tablet 20 mg, 20 mg, Oral, Q supper, Swartz, Zachary T, MD, 20 mg at 06/09/24 1818   calcium  carbonate (TUMS - dosed in mg elemental calcium ) chewable tablet 200 mg of elemental calcium , 1 tablet, Oral, TID PRN, Angiulli, Daniel J, PA-C, 200 mg of elemental calcium  at 06/10/24 1315   doxepin (SINEQUAN) capsule 10 mg, 10 mg, Oral, QHS, Swartz, Zachary T, MD   escitalopram (LEXAPRO) tablet 10 mg, 10 mg, Oral, Daily, Urbano Albright, MD, 10 mg at 06/10/24 9187   feeding supplement (ENSURE PLUS HIGH PROTEIN) liquid 237 mL, 237 mL, Oral, BID BM, Angiulli, Daniel J, PA-C, 237 mL at 06/10/24 9095   hydrALAZINE  (APRESOLINE ) tablet 10 mg, 10 mg, Oral, Q6H PRN, Urbano Albright, MD   levothyroxine  (SYNTHROID ) tablet 100 mcg, 100 mcg, Oral, Q0600, Angiulli, Daniel J, PA-C, 100 mcg at 06/10/24 9441   memantine  (NAMENDA ) tablet 5 mg, 5 mg, Oral, BID, Angiulli, Daniel J, PA-C, 5 mg at 06/10/24 9187   methocarbamol  (ROBAXIN ) tablet 500 mg, 500 mg, Oral, Q8H PRN, Angiulli, Daniel J, PA-C, 500 mg at 06/02/24 2209   mirtazapine  (REMERON ) tablet 15 mg, 15 mg, Oral, QHS, Angiulli, Daniel J, PA-C, 15 mg at 06/09/24 2025   pantoprazole  (PROTONIX ) EC tablet 40 mg, 40 mg, Oral, Q breakfast,  Babs Hussar T, MD, 40 mg at 06/10/24 0558   polyethylene glycol (MIRALAX  / GLYCOLAX ) packet 17 g, 17 g, Oral, BID, Angiulli, Daniel J, PA-C, 17 g at 06/09/24 1057   QUEtiapine (SEROQUEL) tablet 12.5 mg, 12.5 mg, Oral, QHS PRN, Babs Hussar DASEN, MD   senna-docusate (Senokot-S) tablet 1 tablet, 1 tablet, Oral, QHS PRN, Angiulli, Toribio PARAS, PA-C  Allergies: No Known Allergies  Justino Cornish, MD

## 2024-06-10 NOTE — Progress Notes (Signed)
 Physical Therapy Weekly Progress Note  Patient Details  Name: Donna Petty MRN: [id number] Date of Birth: [birth year]  Beginning of progress report period: June 03, 2024 End of progress report period: June 11, 2024  Patient has met 2 of 3 short term goals and partially met remaining goal - pt is currently min A for supine to sit transfers but remains mod A for sit to supine transfers. Pt currently CGA overall for stand pivot transfers and ambulation with RW. Pt requires significant verbal cueing for proper use of RW and safety with mobility. Therapy progress limited by self-limiting behaviors, limited motivation, and depression - pt followed by neuropsych. Pt currently asc/desc 4-6 inch steps using  B HR with min A overall - pt has 6 steps to enter her home with B HR.  Patient continues to demonstrate the following deficits muscle weakness and muscle joint tightness, decreased cardiorespiratoy endurance, impaired timing and sequencing, decreased coordination, and decreased motor planning, decreased attention to left, left side neglect, and decreased motor planning, decreased initiation, decreased attention, decreased awareness, decreased problem solving, and decreased safety awareness, and decreased standing balance, decreased postural control, and decreased balance strategies and therefore will continue to benefit from skilled PT intervention to increase functional independence with mobility.  Patient progressing toward long term goals..  Continue plan of care.  PT Short Term Goals Week 1:  PT Short Term Goal 1 (Week 1): Pt will perform supine <> sit transfers with min A PT Short Term Goal 1 - Progress (Week 1): Partly met (min A for supine to sit; mod A (max at times) for sit to supine) PT Short Term Goal 2 (Week 1): Pt will perform sit <> stand transfers with LRAD with min A PT Short Term Goal 2 - Progress (Week 1): Met PT Short Term Goal 3 (Week 1): Pt will ambulate 50 ft using LRAD with  min A PT Short Term Goal 3 - Progress (Week 1): Met Week 2:  PT Short Term Goal 1 (Week 2): STG=LTG due to ELOS  Skilled Therapeutic Interventions/Progress Updates:      Therapy Documentation Precautions:  Precautions Precautions: Fall Recall of Precautions/Restrictions: Impaired Precaution/Restrictions Comments: L neglect Restrictions Weight Bearing Restrictions Per Provider Order: No  Therapy/Group: Individual Therapy  [name], DPT 06/10/2024, 7:33 AM

## 2024-06-11 MED ORDER — IRBESARTAN 75 MG PO TABS
75.0000 mg | ORAL_TABLET | Freq: Every day | ORAL | Status: DC
  Administered 2024-06-11 – 2024-06-12 (×2): 75 mg via ORAL
  Filled 2024-06-11 (×2): qty 1

## 2024-06-11 NOTE — Progress Notes (Signed)
 Occupational Therapy Session Note  Patient Details  Name: Donna Petty MRN: [id number] Date of Birth: Nov 08, 1935  Today's Date: 06/11/2024 OT Individual Time: 8540-8375 OT Individual Time Calculation (min): 85 min    Short Term Goals: Week 2:  OT Short Term Goal 1 (Week 2): patient will complete LB dressing with Mod assist OT Short Term Goal 2 (Week 2): Patient will perform toilet transfer with CGA OT Short Term Goal 3 (Week 2): Patient will perform grooming standing at sink with CGA  Skilled Therapeutic Interventions/Progress Updates:  Completed family training with daughter. Therapist educated family regarding self care levels, patient engagement . Education also provided on strategies and compensatory techniques to use if patient required assist with balance or LB dressing/ toileting. Education provided for gait belt use and handling technique. Patient daughter completed hands on training for B dressing and chair transfer with therapist providing VC as needed for technique and form. All education completed and all questions  answered. Patient educated on need for participation. Daughter expressed concern on assist level for at home and needing patient to do more . Daughter also relayed correct prior assist level of patient completing more. Patient daughter expressed not thinking wheelchair would fit inside home limiting patient mobility. Patient requested to complete ADL session. Patient SU to SUP UB dressing t shirt. Min to mod bra, patient mod A LB dressing, SU assist for grooming. Patient then requested walk following ADL session. Patient able to complete 15 feet before increased nausea, with vomiting. Reported to RN. Returned to room alarm on all needs in reach.    Daughter states she will call ahead night before to get therapy times to be in attendance to sessions to encourage participation, maximize encouragement, and further caregiver education.   Therapy Documentation Precautions:   Precautions Precautions: Fall Recall of Precautions/Restrictions: Impaired Precaution/Restrictions Comments: L neglect Restrictions Weight Bearing Restrictions Per Provider Order: (P) No  Therapy/Group: Individual Therapy  D'mariea L Davionte Lusby 06/11/2024, 4:46 PM

## 2024-06-11 NOTE — Progress Notes (Signed)
 Physical Therapy Session Note  Patient Details  Name: Donna Petty MRN: [id number] Date of Birth: 02-07-1936  Today's Date: 06/11/2024 PT Individual Time: 0806-0850 PT Individual Time Calculation (min): 44 min   Short Term Goals: Week 1:  PT Short Term Goal 1 (Week 1): Pt will perform supine <> sit transfers with min A PT Short Term Goal 2 (Week 1): Pt will perform sit <> stand transfers with LRAD with min A PT Short Term Goal 3 (Week 1): Pt will ambulate 50 ft using LRAD with min A  Skilled Therapeutic Interventions/Progress Updates:     Pt supine with HOB elevated and just finished breakfast upon arrival. Denies pain. Pt stated, I'm not scheduled for therapy. PT explained opening in schedule and asked pt if she would be open to working on stairs this AM. Pt agreeable. Pt performed supine to sit using hospital bed features with min A for L UE/LE and extra time and encouragement required. Sit to stands and stand pivot transfers performed with CGA and extra time. Pt transported dependent to main gym dependent in [facility] for energy conservation. Pt asc/desc 4-6 inch steps using B HR x 2 trials and required seated rest break in between due to fatigue and weakness - min A req for initial trial and mod A req for second rep with heavy mod for descent. Pt required verbal cueing for sequencing. Once back in room, pt transferred back to EOB with CGA and extra time, however, required mod/max A for sit to supine transfer due to self-limiting behaviors. Bed alarm set and all needs within reach at end of session.  Therapy Documentation Precautions:  Precautions Precautions: Fall Recall of Precautions/Restrictions: Impaired Precaution/Restrictions Comments: L neglect Restrictions Weight Bearing Restrictions Per Provider Order: No  Therapy/Group: Individual Therapy  [name], PT, DPT 06/11/2024, 8:47 AM

## 2024-06-11 NOTE — Progress Notes (Signed)
 PROGRESS NOTE   Subjective/Complaints: No new complaints this morning Appreciate palliative care following up with patient today Patient's chart reviewed- No issues reported overnight  ROS: Limited due to cognitive/behavioral   Objective:   No results found. Recent Labs    06/09/24 0502  WBC 7.3  HGB 13.0  HCT 39.1  PLT 346    Recent Labs    06/09/24 0502  NA 141  K 4.4  CL 107  CO2 24  GLUCOSE 91  BUN 18  CREATININE 1.27*  CALCIUM  9.0     Intake/Output Summary (Last 24 hours) at 06/11/2024 1309 Last data filed at 06/11/2024 0856 Gross per 24 hour  Intake 360 ml  Output --  Net 360 ml          Physical Exam: Vital Signs Blood pressure (!) 141/74, pulse 70, temperature 98.6 F (37 C), temperature source Oral, resp. rate 18, height 5' 1 (1.549 m), weight 74.3 kg, SpO2 100%.  Constitutional: No distress . Vital signs reviewed. Lying in bed  HEENT: NCAT, EOMI, oral membranes moist Neck: supple Cardiovascular: RRR without murmur. No JVD    Respiratory/Chest: CTA Bilaterally without wheezes or rales. Normal effort    GI/Abdomen: BS +, non-tender, non-distended Ext: no clubbing, cyanosis, or edema Psych: anxious, flat at times  Skin: Many bruises throughout esp LUE distally and R inner thigh  Neuro:  alert and oriented x3, Right gaze preference but can look left.  L hemianopia  -had german measles as child affecting l eye and macular degeneration normal language  MOTOR: RUE and RLE 5/5, other than Ankle DF 4/5- may be effort limited LUE/LLE- 5-/5 throughout  other than Ankle DF 4/5- may be effort limited  No hypertonia noted   Prior neuro assessment is c/w today's exam 06/11/2024.    Assessment/Plan: 1. Functional deficits which require 3+ hours per day of interdisciplinary therapy in a comprehensive inpatient rehab setting. Physiatrist is providing close team supervision and 24 hour  management of active medical problems listed below. Physiatrist and rehab team continue to assess barriers to discharge/monitor patient progress toward functional and medical goals  Care Tool:  Bathing    Body parts bathed by patient: Right arm, Left arm, Chest, Front perineal area, Abdomen, Right upper leg, Left upper leg, Face   Body parts bathed by helper: Buttocks, Right lower leg, Left lower leg     Bathing assist Assist Level: Moderate Assistance - Patient 50 - 74%     Upper Body Dressing/Undressing Upper body dressing   What is the patient wearing?: Dress    Upper body assist Assist Level: Supervision/Verbal cueing    Lower Body Dressing/Undressing Lower body dressing      What is the patient wearing?: Incontinence brief     Lower body assist Assist for lower body dressing: Dependent - Patient 0%     Toileting Toileting    Toileting assist Assist for toileting: Maximal Assistance - Patient 25 - 49%     Transfers Chair/bed transfer  Transfers assist     Chair/bed transfer assist level: Contact Guard/Touching assist (RW)     Locomotion Ambulation   Ambulation assist      Assist  level: Contact Guard/Touching assist Assistive device: Walker-rolling Max distance: 159   Walk 10 feet activity   Assist     Assist level: Contact Guard/Touching assist Assistive device: Walker-rolling   Walk 50 feet activity   Assist Walk 50 feet with 2 turns activity did not occur: Safety/medical concerns (weakness/fatigue/pain)  Assist level: Contact Guard/Touching assist Assistive device: Walker-rolling    Walk 150 feet activity   Assist Walk 150 feet activity did not occur: Safety/medical concerns (weakness/fatigue/pain)  Assist level: Contact Guard/Touching assist Assistive device: Walker-rolling    Walk 10 feet on uneven surface  activity   Assist Walk 10 feet on uneven surfaces activity did not occur: Safety/medical concerns  (weakness/fatigue/pain)         Wheelchair     Assist Is the patient using a wheelchair?: Yes Type of Wheelchair: Manual Wheelchair activity did not occur: Refused  Wheelchair assist level: Contact Guard/Touching assist Max wheelchair distance: 50 ft    Wheelchair 50 feet with 2 turns activity    Assist    Wheelchair 50 feet with 2 turns activity did not occur: Refused       Wheelchair 150 feet activity     Assist  Wheelchair 150 feet activity did not occur: Refused       Blood pressure (!) 141/74, pulse 70, temperature 98.6 F (37 C), temperature source Oral, resp. rate 18, height 5' 1 (1.549 m), weight 74.3 kg, SpO2 100%.  Medical Problem List and Plan: 1. 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  Antithrombotics: -DVT/anticoagulation:  Pharmaceutical: continue Eliquis for A flutter             -antiplatelet therapy: N/A  3. Pain Management: continue Robaxin  500 mg every 8 hours as needed 4. Mood/Behavior/Sleep: Namenda  5 mg twice daily, Remeron  15 mg nightly, Xanax 0.25 mg twice daily as needed             -antipsychotic agents: seroquel prn--dc  -Started lexapro 10mg  daily  11/7-followed by psychiatry--doxepin suggested for sleep--will start  -team providing ego support as possible 5. Neuropsych/cognition: This patient is not capable of making decisions on her own behalf due to vascular dementia 6. Skin/Wound Care: Routine skin checks 7. Fluids/Electrolytes/Nutrition: encourage appropriate po 8.  Hypertension.  Continue Norvasc to 10mg  daily. Monitor with increased  mobility    06/11/2024   11:25 AM 06/11/2024    6:05 AM 06/11/2024    4:41 AM  Vitals with BMI  Systolic 141 165 827  Diastolic 74 63 65  Pulse  70 69   -11/2 add PRN hydralazine  10mg  PRN SBP >160 Add avapro 75mg  daily .  9.  Hyperlipidemia. Continue Lipitor 10.  GERD.  Protonix  11.  Hypothyroidism.  Synthroid  100 mcg daily 12. Hx of german measles of L eye as child and macular degeneration- not clear why cannot see out of L eye- could be old vs hemianopia 13. Vascular dementia- continue namenda   14. Hx of L THA in  01/2024- and prior L TKA  15. Aflutter- continue Eliquis- doesn't need rate control per Cards  HR controlled   17. CKD3a: creatinine reviewed and has been stable      LOS: 9 days A FACE TO FACE EVALUATION WAS PERFORMED  Donna Petty P Benson Porcaro 06/11/2024, 1:09 PM

## 2024-06-11 NOTE — Progress Notes (Signed)
 Palliative Medicine Inpatient Follow Up Note   HPI:  88 y.o. female  with past medical history of hypothyroidism, dementia, carotid artery disease, GERD  admitted on 05/24/2024 with left sided weakness. Code stroke was activated, MRI showed a large right acute PCA infarct and conditional scattered small bilateral acute cerebral and cerebellar infarcts. mechanical thrombectomy attempted for right PCA P1 occlusion but unsuccessful due to extreme vertebral artery tortuosity  Patient was manifesting right sided gaze with left hemianopsia and left-sided extinction on arrival to the ED.    The attending team requested a follow-up discussion with the patient and her daughter, Donna Petty, to revisit goals of care. This was prompted by the patient's ongoing verbal expressions of dissatisfaction and frustration about being hospitalized and receiving therapies.  Today's Discussion 06/11/2024  *Please note that this is a verbal dictation therefore any spelling or grammatical errors are due to the Dragon Medical One system interpretation.  Chart reviewed inclusive of vital signs, progress notes, laboratory results, and diagnostic images.   I visited the patient at the bedside today and found her resting comfortably in a semi-Fowler's position. The patient denied any acute concerns at this time, including pain or discomfort. She reports her sleeping has been improved since she started to take the medicines to help her sleep.  She shared that she had just finished breakfast. When asked about her appetite, she stated that it was "so-so," noting that her lack of enthusiasm was partly due to not being fond of the hospital food.  I inquired about her feelings toward participating in therapy to help her regain strength and eventually return home with her daughter. She responded that it was "alright," but expressed a strong desire to be at home rather than in the hospital. I explained that achieving this goal would  require demonstrating some level of independence, which makes continued participation in the therapies ordered for her in the inpatient rehabilitation unit essential.  Goals of care remain unchanged, to continue current medical management and inpatient rehabilitation therapy with the aim of facilitating discharge home with her daughter.  Created space and opportunity for patient to explore thoughts feelings and fears regarding current medical situation.  Questions and concerns addressed   Palliative Support Provided.   Objective Assessment: Vital Signs Vitals:   06/11/24 0605 06/11/24 1125  BP: (!) 165/63 (!) 141/74  Pulse: 70   Resp:    Temp:    SpO2:      Intake/Output Summary (Last 24 hours) at 06/11/2024 1230 Last data filed at 06/11/2024 0856 Gross per 24 hour  Intake 360 ml  Output --  Net 360 ml   Last Weight  Most recent update: 06/02/2024  5:11 PM    Weight  74.3 kg (163 lb 12.8 oz)             Physical Exam Vitals and nursing note reviewed.  Constitutional:      Appearance: Fatigued-looking HENT:     Head: Normocephalic and atraumatic.  Cardiovascular:     Rate and Rhythm: Normal rate.  Pulmonary:     Effort: Pulmonary effort is normal.  Abdominal:     General: Abdomen is flat.     Palpations: Abdomen is soft.    Musculoskeletal:     Cervical back: Normal range of motion.     Comments: Generalized weakness more observed on the left side.   Skin    General: Skin is warm and dry.  Neurological:     Mental Status: She is  alert and oriented to person, place, and time.  Psychiatric:  NAD  SUMMARY OF RECOMMENDATIONS    Code Status: Maintain DNR-Limited. No heroic measures.  Continue current scope of care, including CIR for PT, OT, SLP in hopes for continued progress and eventually discharge home with daughter. Daughter not ready for comfort-focused or hospice approach, but fine with outpatient palliative services on discharge.  Will place [facility] consult  for this. Encouraged the daughter to continue discussions with patient and the care team to ensure decisions align with the patient's values and goals.  Continue to provide psycho-social and emotional support to patient and family Palliative medicine team will continue to follow.    Symptom Management: Per Primary team Lexapro 10mg  daily Acetaminophen  PRN for pain Methocarbamol  PRN for muscle spasms Memantine  5mg  BID for cognitive decline Seroquel 12.5mg  PRN at bed time (if Mirtazapine  and Doxepin ineffective for sleep) Mirtazapine  at HS for insomnia Doxepin at HS for insomnia Palliative medicine is available to assist as needed.      Time Spent: 35 minutes  Detailed review of medical records (labs, imaging, vital signs), medically appropriate exam, discussed with treatment team, counseling and education to patient, family, & staff, documenting clinical information, coordination of care.    ______________________________________________________________________________________ Kathlyne Bolder NP-C [place] Palliative Medicine Team Team Cell Phone: [phone] Please utilize secure chat with additional questions, if there is no response within 30 minutes please call the above phone number  Palliative Medicine Team providers are available by phone from 7am to 7pm daily and can be reached through the team cell phone.  Should this patient require assistance outside of these hours, please call the patient's attending physician.

## 2024-06-12 MED ORDER — IRBESARTAN 75 MG PO TABS
150.0000 mg | ORAL_TABLET | Freq: Every day | ORAL | Status: DC
  Administered 2024-06-13 – 2024-06-15 (×3): 150 mg via ORAL
  Filled 2024-06-12 (×3): qty 2

## 2024-06-12 NOTE — Progress Notes (Signed)
 Physical Therapy Session Note  Patient Details  Name: Donna Petty MRN: [id number] Date of Birth: 1935/09/24  Today's Date: 06/12/2024 PT Individual Time: 0902-1000 PT Individual Time Calculation (min): 58 min   Short Term Goals: Week 1:  PT Short Term Goal 1 (Week 1): Pt will perform supine <> sit transfers with min A PT Short Term Goal 1 - Progress (Week 1): Partly met (min A for supine to sit; mod A (max at times) for sit to supine) PT Short Term Goal 2 (Week 1): Pt will perform sit <> stand transfers with LRAD with min A PT Short Term Goal 2 - Progress (Week 1): Met PT Short Term Goal 3 (Week 1): Pt will ambulate 50 ft using LRAD with min A PT Short Term Goal 3 - Progress (Week 1): Met  Skilled Therapeutic Interventions/Progress Updates:      Pt supine in bed upon arrival. Pt agreeable to therapy. Pt denies any pain.   Utilized music throughout session for pt overall mood and emotional well-being.   Supine to sit with use of bed rails and HOB elevated with supervision and ++ time, verbal cues provided for L UE/LE positioning.   Pt navigated 2x4 6 inch steps with B handrails and step to gait, ascending leading with R LE, descending leading with L LE. Pt demonstrates heavy trunk flexion. Pt required CGA for ascending and light min A progressing to CGA for descending. Verbal cues provided for sequencing, and attention to L UE with descending.   Pt ambulated 127 feet with RW and CGA, verbal cues provided for upright posture, and safety with RW with emphasis on proximity. Pt required intermittent cuing for L LE positioning especially with navigating turns.   Pt performed stand pivot transfer WC to car simulator at height of SUV with RW and CGA, verbal cues provided for sequencing and use of L UE for correction of lateral trunk lean to L whne raising B LE into car.   Provided pt with seated rest breaks throughout session for fatigue.   Sit to supine with min A for management of L LE.  Pt scooted up in bed iwht use of bed rails and bridge technique, pt required min A for repositioning.   Pt supine in bed with all needs within reach and bed alarm on.               Therapy Documentation Precautions:  Precautions Precautions: Fall Recall of Precautions/Restrictions: Impaired Precaution/Restrictions Comments: L neglect Restrictions Weight Bearing Restrictions Per Provider Order: No  Therapy/Group: Individual Therapy  [facility] Doreene Orris, [place], DPT  06/12/2024, 7:27 AM

## 2024-06-12 NOTE — Plan of Care (Signed)
  Problem: RH BOWEL ELIMINATION Goal: RH STG MANAGE BOWEL WITH ASSISTANCE Description: STG Manage Bowel with mod I  Assistance. Outcome: Progressing   Problem: RH BLADDER ELIMINATION Goal: RH STG MANAGE BLADDER WITH ASSISTANCE Description: STG Manage Bladder With toileting Assistance Outcome: Progressing   Problem: RH SAFETY Goal: RH STG ADHERE TO SAFETY PRECAUTIONS W/ASSISTANCE/DEVICE Description: STG Adhere to Safety Precautions With cues Assistance/Device. Outcome: Progressing   Problem: RH PAIN MANAGEMENT Goal: RH STG PAIN MANAGED AT OR BELOW PT'S PAIN GOAL Description: Pain < 4 with prns Outcome: Progressing

## 2024-06-12 NOTE — Progress Notes (Signed)
 Palliative Medicine Inpatient Follow Up Note   HPI:  88 y.o. female  with past medical history of hypothyroidism, dementia, carotid artery disease, GERD  admitted on 05/24/2024 with left sided weakness. Code stroke was activated, MRI showed a large right acute PCA infarct and conditional scattered small bilateral acute cerebral and cerebellar infarcts. mechanical thrombectomy attempted for right PCA P1 occlusion but unsuccessful due to extreme vertebral artery tortuosity  Patient was manifesting right sided gaze with left hemianopsia and left-sided extinction on arrival to the ED.    The attending team requested a follow-up discussion with the patient and her daughter, [name], to revisit goals of care. This was prompted by the patient's ongoing verbal expressions of dissatisfaction and frustration about being hospitalized and receiving therapies.  Today's Discussion 06/12/2024  *Please note that this is a verbal dictation therefore any spelling or grammatical errors are due to the Dragon Medical One system interpretation.  Chart reviewed inclusive of vital signs, progress notes, laboratory results, and diagnostic images.   I visited the patient today and observed her actively participating in physical therapy in the therapy room. She appeared to be in good spirit, alert, oriented, and without any signs of acute distress.  She reports having a good night sleep. She was able to communicate her needs effectively and no new complaints raised.  The patient shared that her motivation for therapy is to achieve her goal of returning home with her daughter. According to the physical therapy team, she is progressing well, with a focus on improving ambulation and step-to-gait.  She reported no pain or discomfort during the session. Goals of care remain unchanged, continue inpatient rehabilitation with PT, OT, and ST, aiming for eventual discharge home with her daughter.  Created space and opportunity  for patient to explore thoughts feelings and fears regarding current medical situation.  Questions and concerns addressed   Palliative Support Provided.   Objective Assessment: Vital Signs Vitals:   06/12/24 0434 06/12/24 0846  BP: (!) 166/45 (!) 161/71  Pulse: 69   Resp: 17   Temp: 98.7 F (37.1 C)   SpO2: 94%     Intake/Output Summary (Last 24 hours) at 06/12/2024 1013 Last data filed at 06/12/2024 0830 Gross per 24 hour  Intake 720 ml  Output --  Net 720 ml   Last Weight  Most recent update: 06/02/2024  5:11 PM    Weight  74.3 kg (163 lb 12.8 oz)             Physical Exam Vitals and nursing note reviewed.  Constitutional:      Appearance: Fatigued-looking HENT:     Head: Normocephalic and atraumatic.  Cardiovascular:     Rate and Rhythm: Normal rate.  Pulmonary:     Effort: Pulmonary effort is normal.  Abdominal:     General: Abdomen is flat.     Palpations: Abdomen is soft.    Musculoskeletal:     Cervical back: Normal range of motion.     Comments: Generalized weakness more observed on the left side.   Skin    General: Skin is warm and dry.  Neurological:     Mental Status: She is alert and oriented to person, place, and time.  Psychiatric:  NAD  SUMMARY OF RECOMMENDATIONS    Code Status: Maintain DNR-Limited. No heroic measures.  Continue current scope of care, including CIR for PT, OT, SLP in hopes for continued progress and eventually discharge home with daughter. Daughter not ready for comfort-focused or  hospice approach, but fine with outpatient palliative services on discharge. TOC consult placed 06/10/2024. Encouraged the daughter to continue discussions with patient and the care team to ensure decisions align with the patient's values and goals.  Continue to provide psycho-social and emotional support to patient and family    Symptom Management: Per Primary team Lexapro 10mg  daily Acetaminophen  PRN for pain Methocarbamol  PRN for muscle  spasms Memantine  5mg  BID for cognitive decline Seroquel 12.5mg  PRN at bed time (if Mirtazapine  and Doxepin ineffective for sleep) Mirtazapine  at HS for insomnia Doxepin at HS for insomnia Palliative medicine is available to assist as needed.      Time Spent: 35 minutes  Detailed review of medical records (labs, imaging, vital signs), medically appropriate exam, discussed with treatment team, counseling and education to patient, family, & staff, documenting clinical information, coordination of care.    ______________________________________________________________________________________ Kathlyne Bolder NP-C [place] Palliative Medicine Team Team Cell Phone: [phone] Please utilize secure chat with additional questions, if there is no response within 30 minutes please call the above phone number  Palliative Medicine Team providers are available by phone from 7am to 7pm daily and can be reached through the team cell phone.  Should this patient require assistance outside of these hours, please call the patient's attending physician.

## 2024-06-12 NOTE — Progress Notes (Signed)
 PROGRESS NOTE   Subjective/Complaints: No new complaints this morning Asked to listen to gospel music, conveyed to nursing Appreciate palliative care following  ROS: Limited due to cognitive/behavioral   Objective:   No results found. No results for input(s): WBC, HGB, HCT, PLT in the last 72 hours.   No results for input(s): NA, K, CL, CO2, GLUCOSE, BUN, CREATININE, CALCIUM  in the last 72 hours.    Intake/Output Summary (Last 24 hours) at 06/12/2024 1350 Last data filed at 06/12/2024 1340 Gross per 24 hour  Intake 720 ml  Output --  Net 720 ml          Physical Exam: Vital Signs Blood pressure (!) 161/71, pulse 69, temperature 98.7 F (37.1 C), temperature source Oral, resp. rate 17, height 5' 1 (1.549 m), weight 74.3 kg, SpO2 94%.  Constitutional: No distress . Vital signs reviewed. Lying in bed  HEENT: NCAT, EOMI, oral membranes moist Neck: supple Cardiovascular: RRR without murmur. No JVD    Respiratory/Chest: CTA Bilaterally without wheezes or rales. Normal effort    GI/Abdomen: BS +, non-tender, non-distended Ext: no clubbing, cyanosis, or edema Psych: anxious, flat at times  Skin: Many bruises throughout esp LUE distally and R inner thigh  Neuro:  alert and oriented x3, Right gaze preference but can look left.  L hemianopia  -had german measles as child affecting l eye and macular degeneration normal language  MOTOR: RUE and RLE 5/5, other than Ankle DF 4/5- may be effort limited LUE/LLE- 5-/5 throughout  other than Ankle DF 4/5- may be effort limited, stable 11/9  No hypertonia noted   Prior neuro assessment is c/w today's exam 06/12/2024.    Assessment/Plan: 1. Functional deficits which require 3+ hours per day of interdisciplinary therapy in a comprehensive inpatient rehab setting. Physiatrist is providing close team supervision and 24 hour management of active medical  problems listed below. Physiatrist and rehab team continue to assess barriers to discharge/monitor patient progress toward functional and medical goals  Care Tool:  Bathing    Body parts bathed by patient: Right arm, Left arm, Chest, Front perineal area, Abdomen, Right upper leg, Left upper leg, Face   Body parts bathed by helper: Buttocks, Right lower leg, Left lower leg     Bathing assist Assist Level: Moderate Assistance - Patient 50 - 74%     Upper Body Dressing/Undressing Upper body dressing   What is the patient wearing?: Dress    Upper body assist Assist Level: Supervision/Verbal cueing    Lower Body Dressing/Undressing Lower body dressing      What is the patient wearing?: Incontinence brief     Lower body assist Assist for lower body dressing: Dependent - Patient 0%     Toileting Toileting    Toileting assist Assist for toileting: Maximal Assistance - Patient 25 - 49%     Transfers Chair/bed transfer  Transfers assist     Chair/bed transfer assist level: Contact Guard/Touching assist     Locomotion Ambulation   Ambulation assist      Assist level: Contact Guard/Touching assist Assistive device: Walker-rolling Max distance: 159   Walk 10 feet activity   Assist  Assist level: Contact Guard/Touching assist Assistive device: Walker-rolling   Walk 50 feet activity   Assist Walk 50 feet with 2 turns activity did not occur: Safety/medical concerns (weakness/fatigue/pain)  Assist level: Contact Guard/Touching assist Assistive device: Walker-rolling    Walk 150 feet activity   Assist Walk 150 feet activity did not occur: Safety/medical concerns (weakness/fatigue/pain)  Assist level: Contact Guard/Touching assist Assistive device: Walker-rolling    Walk 10 feet on uneven surface  activity   Assist Walk 10 feet on uneven surfaces activity did not occur: Safety/medical concerns (weakness/fatigue/pain)          Wheelchair     Assist Is the patient using a wheelchair?: Yes Type of Wheelchair: Manual Wheelchair activity did not occur: Refused  Wheelchair assist level: Contact Guard/Touching assist Max wheelchair distance: 50 ft    Wheelchair 50 feet with 2 turns activity    Assist    Wheelchair 50 feet with 2 turns activity did not occur: Refused       Wheelchair 150 feet activity     Assist  Wheelchair 150 feet activity did not occur: Refused       Blood pressure (!) 161/71, pulse 69, temperature 98.7 F (37.1 C), temperature source Oral, resp. rate 17, height 5' 1 (1.549 m), weight 74.3 kg, SpO2 94%.  Medical Problem List and Plan: 1. 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  Antithrombotics: -DVT/anticoagulation:  Pharmaceutical: continue Eliquis for A flutter             -antiplatelet therapy: N/A  3. Pain Management: continue Robaxin  500 mg every 8 hours as needed 4. Mood/Behavior/Sleep: Namenda  5 mg twice daily, Remeron  15 mg nightly, Xanax 0.25 mg twice daily as needed             -antipsychotic agents: seroquel prn--dc  -Started lexapro 10mg  daily  11/7-followed by psychiatry--doxepin suggested for sleep--will start  -team providing ego support as possible 5. Neuropsych/cognition: This patient is not capable of making decisions on her own behalf due to vascular dementia 6. Skin/Wound Care: Routine skin checks 7. Fluids/Electrolytes/Nutrition: encourage appropriate po 8.  Hypertension.  Continue Norvasc to 10mg  daily. Monitor with increased mobility    06/12/2024    8:46 AM  06/12/2024    4:34 AM 06/11/2024    7:50 PM  Vitals with BMI  Systolic 161 166 850  Diastolic 71 45 54  Pulse  69 75   -11/2 add PRN hydralazine  10mg  PRN SBP >160 Increase Avapro to 150mg  daily .  9.  Hyperlipidemia. conitnue Lipitor 10.  GERD.  Protonix  11.  Hypothyroidism.  Synthroid  100 mcg daily 12. Hx of german measles of L eye as child and macular degeneration- not clear why cannot see out of L eye- could be old vs hemianopia 13. Vascular dementia- continue namenda   14. Hx of L THA in 01/2024- and prior L TKA  15. Aflutter- continue Eliquis- doesn't need rate control per Cards  HR controlled  17. CKD3a: creatinine reviewed and has been stable      LOS: 10 days A FACE TO FACE EVALUATION WAS PERFORMED  Donna Petty P [employer] 06/12/2024, 1:50 PM

## 2024-06-13 LAB — CBC
HCT: 38.5 % (ref 36.0–46.0)
Hemoglobin: 12.7 g/dL (ref 12.0–15.0)
MCH: 30.6 pg (ref 26.0–34.0)
MCHC: 33 g/dL (ref 30.0–36.0)
MCV: 92.8 fL (ref 80.0–100.0)
Platelets: 310 K/uL (ref 150–400)
RBC: 4.15 MIL/uL (ref 3.87–5.11)
RDW: 15.6 % — ABNORMAL HIGH (ref 11.5–15.5)
WBC: 7.4 K/uL (ref 4.0–10.5)
nRBC: 0 % (ref 0.0–0.2)

## 2024-06-13 LAB — BASIC METABOLIC PANEL WITH GFR
Anion gap: 9 (ref 5–15)
BUN: 19 mg/dL (ref 8–23)
CO2: 25 mmol/L (ref 22–32)
Calcium: 8.9 mg/dL (ref 8.9–10.3)
Chloride: 108 mmol/L (ref 98–111)
Creatinine, Ser: 1.29 mg/dL — ABNORMAL HIGH (ref 0.44–1.00)
GFR, Estimated: 40 mL/min — ABNORMAL LOW (ref >60)
Glucose, Bld: 90 mg/dL (ref 70–99)
Potassium: 4.1 mmol/L (ref 3.5–5.1)
Sodium: 142 mmol/L (ref 135–145)

## 2024-06-13 NOTE — Progress Notes (Signed)
 Occupational Therapy Session Note  Patient Details  Name: Donna Petty MRN: [id number] Date of Birth: [birth year]  Today's Date: 06/13/2024 OT Individual Time: 9169-9069 OT Individual Time Calculation (min): 60 min    Short Term Goals: Week 1:  OT Short Term Goal 1 (Week 1): patient will complete toileting with mod A OT Short Term Goal 1 - Progress (Week 1): Met OT Short Term Goal 2 (Week 1): patient will complete LB dressing with mod A OT Short Term Goal 2 - Progress (Week 1): Partly met OT Short Term Goal 3 (Week 1): patient will complete standing during functional activities with min A for 45+ seconds OT Short Term Goal 3 - Progress (Week 1): Met Week 2:  OT Short Term Goal 1 (Week 2): patient will complete LB dressing with Mod assist OT Short Term Goal 2 (Week 2): Patient will perform toilet transfer with CGA OT Short Term Goal 3 (Week 2): Patient will perform grooming standing at sink with CGA  Skilled Therapeutic Interventions/Progress Updates:    Pt received in bed and agreeable to a shower.  See ADL documentation below.   she would benefit from practice with TUB transfers (she said she stepped over PLOF but might need a tub bench)  also could use long sponge for feet and practice with reacher for pants.   She continues to need cues to push up from sitting surface to stand vs pushing up from RW.  Pt ambulates with CGA with severely kyphotic forward posture.  Pt had a good awareness of what needed to occur and remembered some important details.   Pt resting in recliner with seat alarm on.   Therapy Documentation Precautions:  Precautions Precautions: Fall Recall of Precautions/Restrictions: Impaired Precaution/Restrictions Comments: L neglect Restrictions Weight Bearing Restrictions Per Provider Order: No   Pain: Pain Assessment Pain Score: 0-No pain ADL: ADL Equipment Provided: Reacher Eating: Set up Where Assessed-Eating: Wheelchair Grooming: Setup Where  Assessed-Grooming: Sitting at sink Upper Body Bathing: Supervision/safety Where Assessed-Upper Body Bathing: Shower Lower Body Bathing: Minimal assistance Where Assessed-Lower Body Bathing: Shower Upper Body Dressing: Setup Where Assessed-Upper Body Dressing: Chair Lower Body Dressing: Moderate assistance Where Assessed-Lower Body Dressing: Chair Toileting: Moderate assistance Where Assessed-Toileting: Teacher, Adult Education: Furniture Conservator/restorer Method: Proofreader: Grab bars, Raised toilet seat Film/video Editor: Administrator, Arts Method: Designer, Industrial/product: Emergency planning/management officer, Grab bars   Therapy/Group: Individual Therapy  Acie Custis 06/13/2024, 9:29 AM

## 2024-06-13 NOTE — Progress Notes (Signed)
 Occupational Therapy Session Note  Patient Details  Name: CHARLESETTA MILLIRON MRN: [id number] Date of Birth: 1936-01-27  Today's Date: 06/13/2024 OT Individual Time: 1030-1100 OT Individual Time Calculation (min): 30 min    Short Term Goals: Week 2:  OT Short Term Goal 1 (Week 2): patient will complete LB dressing with Mod assist OT Short Term Goal 2 (Week 2): Patient will perform toilet transfer with CGA OT Short Term Goal 3 (Week 2): Patient will perform grooming standing at sink with CGA  Skilled Therapeutic Interventions/Progress Updates:      Therapy Documentation Precautions:  Precautions Precautions: Fall Recall of Precautions/Restrictions: Impaired Precaution/Restrictions Comments: L neglect Restrictions Weight Bearing Restrictions Per Provider Order: No General: Pt seated in recliner upon OT arrival, agreeable to OT.  Pain: no pain reported  Other Treatments: OT providing skilled intervention on ADLs, DME needs and PLOF. Pt reporting used to use RW to step backward over tub threashold into shower and shower standing. OT discussing potential for use of TTB for increased safety d/t decreased activity tolerance and weakness. OT showing pt examples of set up in bathroom for pt's increased understanding. OT also educating pt on use of DME for increased energy conservation strategies and safety. Pt reporting her daughter purchased seat for shower but pt unable to recall any details of item.   Pt seated in recliner at end of session with W/C alarm donned, call light within reach and 4Ps assessed.    Therapy/Group: Individual Therapy  [name] Hoe, OTD, OTR/L 06/13/2024, 7:37 PM

## 2024-06-13 NOTE — Plan of Care (Signed)
 Remains incontinent of bladder at [facility]

## 2024-06-13 NOTE — Plan of Care (Signed)
  Problem: RH BOWEL ELIMINATION Goal: RH STG MANAGE BOWEL WITH ASSISTANCE Description: STG Manage Bowel with mod I  Assistance. Outcome: Progressing   Problem: RH BLADDER ELIMINATION Goal: RH STG MANAGE BLADDER WITH ASSISTANCE Description: STG Manage Bladder With toileting Assistance Outcome: Progressing   Problem: RH SAFETY Goal: RH STG ADHERE TO SAFETY PRECAUTIONS W/ASSISTANCE/DEVICE Description: STG Adhere to Safety Precautions With cues Assistance/Device. Outcome: Progressing   Problem: RH PAIN MANAGEMENT Goal: RH STG PAIN MANAGED AT OR BELOW PT'S PAIN GOAL Description: Pain < 4 with prns Outcome: Progressing

## 2024-06-13 NOTE — Progress Notes (Signed)
 Physical Therapy Session Note  Patient Details  Name: Donna Petty MRN: [id number] Date of Birth: 07/15/1936  Today's Date: 06/13/2024 PT Individual Time: 1416-1526 PT Individual Time Calculation (min): 70 min   Short Term Goals: Week 2:  PT Short Term Goal 1 (Week 2): STG=LTG due to ELOS  Skilled Therapeutic Interventions/Progress Updates:      Pt presents in recliner with her daughter, [name], at the bedside. Focused session on family education and training to help prepare for DC home in a mobile home with 6 steps to enter. [name] reports not feeling ready for patient to DC on scheduled date of Wednesday the 12th. Primary team is aware of this.   Sit<>stand to RW with CGA from recliner. Ambulates with CGA and RW throughout session with a flexed posture (baseline), decreased gait speed, and decreased step lengths. Distances of 57' + 39' + 25' throughout the session.   Stair training reviewed with 6 steps and 2 hand rails. She navigated x4 + x4 steps with CGA and 2 hand rails. Step-to pattern for both directions. Educated daughter on guarding and positioning for stair navigation.   Car transfer completed with car height simulating their large SUV [employer]). Daughter reports patient typically uses the runner on the car to step onto to complete the car transfer. Used a 8 block to simulate runner but difficult to accurately simulate. Pt needing minA for car transfer given the step, has difficulty turning to sit while standing on the step and holding the car door frame/handles. Daughter reports they have a [facility] but fear it's too low to the ground for patient to safely get out of. Educated them on lower car height may be more safe despite having more difficulty getting out of.   Next, reviewed mobility in ADL apartment. Practiced bed transfers while using a step-stool since this is how patient enters the bed at home. Patient completed this transfer at [facility] level, but relied on the bed  rail and assist for balance. Discussed strategies to lower bed height (remove box spring) and purchase bed rails for stability.   Finally, reviewed tub transfers with the daughter. Daughter reports the bathroom is too small to accommodate a tub-bench. Daughter reports that patient would typically step backwards into the bathroom. Daughter also confirms there is no hand rails or grab bars in the bathroom. Education provided on safety techniques, likely need for tub bench and/or grab bars, but will defer to primary OT.   Pt returned to her room and she was assisted to recliner with CGA and RW. Cues needed for controlled sitting to safely sit. Left with her needs met and chair pad alarm on.   Therapy Documentation Precautions:  Precautions Precautions: Fall Recall of Precautions/Restrictions: Impaired Precaution/Restrictions Comments: L neglect Restrictions Weight Bearing Restrictions Per Provider Order: No General:      Therapy/Group: Individual Therapy  Sherlean SHAUNNA Perks 06/13/2024, 7:51 AM

## 2024-06-13 NOTE — Progress Notes (Signed)
 PROGRESS NOTE   Subjective/Complaints: No new complaints or concerns this morning.  ROS: Limited due to cognitive/behavioral   Objective:   No results found. Recent Labs    06/13/24 0502  WBC 7.4  HGB 12.7  HCT 38.5  PLT 310     Recent Labs    06/13/24 0502  NA 142  K 4.1  CL 108  CO2 25  GLUCOSE 90  BUN 19  CREATININE 1.29*  CALCIUM  8.9      Intake/Output Summary (Last 24 hours) at 06/13/2024 1649 Last data filed at 06/13/2024 1247 Gross per 24 hour  Intake 981 ml  Output --  Net 981 ml          Physical Exam: Vital Signs Blood pressure 124/63, pulse 76, temperature 98.4 F (36.9 C), temperature source Oral, resp. rate 16, height 5' 1 (1.549 m), weight 74.3 kg, SpO2 99%.  Constitutional: No distress . Vital signs reviewed.  Sitting in wheelchair working with therapy HEENT: NCAT, EOMI, oral membranes a little dry Neck: supple Cardiovascular: RRR without murmur. No JVD    Respiratory/Chest: CTA Bilaterally without wheezes or rales. Normal effort    GI/Abdomen: BS +, non-tender, non-distended Ext: no clubbing, cyanosis, or edema Psych: anxious, flat at times  Skin: Many bruises throughout esp LUE distally and R inner thigh  Neuro:  alert and oriented x3, Right gaze preference but can look left.  L hemianopia  -had german measles as child affecting l eye and macular degeneration normal language  MOTOR: RUE and RLE 5/5, other than Ankle DF 4/5- may be effort limited LUE/LLE- 5-/5 throughout  other than Ankle DF 4/5- may be effort limited, stable 11/9  No hypertonia noted   Prior neuro assessment is c/w today's exam 06/13/2024.    Assessment/Plan: 1. Functional deficits which require 3+ hours per day of interdisciplinary therapy in a comprehensive inpatient rehab setting. Physiatrist is providing close team supervision and 24 hour management of active medical problems listed  below. Physiatrist and rehab team continue to assess barriers to discharge/monitor patient progress toward functional and medical goals  Care Tool:  Bathing    Body parts bathed by patient: Right arm, Left arm, Chest, Front perineal area, Abdomen, Right upper leg, Left upper leg, Face, Buttocks   Body parts bathed by helper: Right lower leg, Left lower leg     Bathing assist Assist Level: Minimal Assistance - Patient > 75%     Upper Body Dressing/Undressing Upper body dressing   What is the patient wearing?: Bra, Pull over shirt    Upper body assist Assist Level: Supervision/Verbal cueing (min a for bra fastening)    Lower Body Dressing/Undressing Lower body dressing      What is the patient wearing?: Incontinence brief, Pants     Lower body assist Assist for lower body dressing: Moderate Assistance - Patient 50 - 74%     Toileting Toileting    Toileting assist Assist for toileting: Maximal Assistance - Patient 25 - 49%     Transfers Chair/bed transfer  Transfers assist     Chair/bed transfer assist level: Contact Guard/Touching assist     Locomotion Ambulation   Ambulation assist  Assist level: Contact Guard/Touching assist Assistive device: Walker-rolling Max distance: 159   Walk 10 feet activity   Assist     Assist level: Contact Guard/Touching assist Assistive device: Walker-rolling   Walk 50 feet activity   Assist Walk 50 feet with 2 turns activity did not occur: Safety/medical concerns (weakness/fatigue/pain)  Assist level: Contact Guard/Touching assist Assistive device: Walker-rolling    Walk 150 feet activity   Assist Walk 150 feet activity did not occur: Safety/medical concerns (weakness/fatigue/pain)  Assist level: Contact Guard/Touching assist Assistive device: Walker-rolling    Walk 10 feet on uneven surface  activity   Assist Walk 10 feet on uneven surfaces activity did not occur: Safety/medical concerns  (weakness/fatigue/pain)         Wheelchair     Assist Is the patient using a wheelchair?: Yes Type of Wheelchair: Manual Wheelchair activity did not occur: Refused  Wheelchair assist level: Contact Guard/Touching assist Max wheelchair distance: 50 ft    Wheelchair 50 feet with 2 turns activity    Assist    Wheelchair 50 feet with 2 turns activity did not occur: Refused       Wheelchair 150 feet activity     Assist  Wheelchair 150 feet activity did not occur: Refused       Blood pressure 124/63, pulse 76, temperature 98.4 F (36.9 C), temperature source Oral, resp. rate 16, height 5' 1 (1.549 m), weight 74.3 kg, SpO2 99%.  Medical Problem List and Plan: 1. Functional deficits secondary to right PCA large infarct with multiple small scattered infarcts in bilateral hemispheres with R P1 occlusion status post IR but unsuccessful due to tortuosity of vessel             -patient may  shower             -ELOS/Goals: 14-16 days supervision of PT, OT and SLP            -Continue CIR therapies including PT, OT, and SLP   -agree with palliative care reassessment of goals of care-patient remains DNR we will plan for continued scope of care  - Expected discharge 11/12, although may potentially need to be extended couple days.  Will discuss with primary therapy team tomorrow  2.  Antithrombotics: -DVT/anticoagulation:  Pharmaceutical: continue Eliquis for A flutter             -antiplatelet therapy: N/A  3. Pain Management: continue Robaxin  500 mg every 8 hours as needed 4. Mood/Behavior/Sleep: Namenda  5 mg twice daily, Remeron  15 mg nightly, Xanax 0.25 mg twice daily as needed             -antipsychotic agents: seroquel prn--dc  -Started lexapro 10mg  daily  11/7-followed by psychiatry--doxepin suggested for sleep--will start  -team providing ego support as possible 5. Neuropsych/cognition: This patient is not capable of making decisions on her own behalf due to  vascular dementia 6. Skin/Wound Care: Routine skin checks 7. Fluids/Electrolytes/Nutrition: encourage appropriate po 8.  Hypertension.  Continue Norvasc to 10mg  daily. Monitor with increased mobility    06/13/2024    1:14 PM 06/13/2024    4:02 AM 06/12/2024    7:50 PM  Vitals with BMI  Systolic 124 181 858  Diastolic 63 91 51  Pulse 76 71 76   -11/2 add PRN hydralazine  10mg  PRN SBP >160 Increase Avapro to 150mg  daily -11/10 BP labile continue to monitor trend .  9.  Hyperlipidemia. conitnue Lipitor 10.  GERD.  Protonix  11.  Hypothyroidism.  Synthroid  100  mcg daily 12. Hx of german measles of L eye as child and macular degeneration- not clear why cannot see out of L eye- could be old vs hemianopia 13. Vascular dementia- continue namenda   14. Hx of L THA in 01/2024- and prior L TKA  15. Aflutter- continue Eliquis- doesn't need rate control per Cards  11/10 HR remains well controlled   17. CKD3a: creatinine reviewed and has been stable  - 11/10 creatinine/BUN stable at 1.29/19      LOS: 11 days A FACE TO FACE EVALUATION WAS PERFORMED  Murray Collier 06/13/2024, 4:49 PM

## 2024-06-13 NOTE — Progress Notes (Signed)
 Speech Language Pathology Daily Session Note  Patient Details  Name: Donna Petty MRN: [id number] Date of Birth: [birth year]  Today's Date: 06/13/2024 SLP Individual Time: 1240-1310 SLP Individual Time Calculation (min): 30 min  Short Term Goals: Week 2: SLP Short Term Goal 1 (Week 2): STG = LTG due to ELOS  Skilled Therapeutic Interventions: Skilled therapy session focused on cognitive goals. SLP facilitated session by educating patient re: stroke s/sx (BEFAST). After education was provided, patient recalled 5/5 s/sx with minA along with emergency phone number. Patient agreed she has noticed some changes in cognitive skills, which is an improvement from prior sessions where she reported she is at baseline. SLP then prompted patient to review schedule to recall activities completed this AM and remaining sessions. Patient recalled OT this morning and interpreted PM schedule independently. Patient left in chair with alarm set and call bell in reach. Continue POC.   Pain denies  Therapy/Group: Individual Therapy  Catrinia Racicot M.A., CCC-SLP 06/13/2024, 7:41 AM

## 2024-06-14 ENCOUNTER — Other Ambulatory Visit (HOSPITAL_COMMUNITY): Payer: Self-pay

## 2024-06-14 MED ORDER — AMLODIPINE BESYLATE 10 MG PO TABS
10.0000 mg | ORAL_TABLET | Freq: Every day | ORAL | 0 refills | Status: DC
  Filled 2024-06-14: qty 30, 30d supply, fill #0

## 2024-06-14 MED ORDER — OMEPRAZOLE 40 MG PO CPDR
40.0000 mg | DELAYED_RELEASE_CAPSULE | Freq: Every day | ORAL | 0 refills | Status: AC
  Filled 2024-06-14: qty 30, 30d supply, fill #0

## 2024-06-14 MED ORDER — APIXABAN 5 MG PO TABS
5.0000 mg | ORAL_TABLET | Freq: Two times a day (BID) | ORAL | 0 refills | Status: AC
  Filled 2024-06-14: qty 60, 30d supply, fill #0

## 2024-06-14 MED ORDER — ATORVASTATIN CALCIUM 20 MG PO TABS
20.0000 mg | ORAL_TABLET | Freq: Every day | ORAL | 0 refills | Status: AC
  Filled 2024-06-14: qty 30, 30d supply, fill #0

## 2024-06-14 MED ORDER — DOXEPIN HCL 10 MG PO CAPS
10.0000 mg | ORAL_CAPSULE | Freq: Every day | ORAL | 0 refills | Status: DC
  Filled 2024-06-14: qty 30, 30d supply, fill #0

## 2024-06-14 MED ORDER — LEVOTHYROXINE SODIUM 100 MCG PO TABS
100.0000 ug | ORAL_TABLET | Freq: Every day | ORAL | 0 refills | Status: AC
  Filled 2024-06-14: qty 30, 30d supply, fill #0

## 2024-06-14 MED ORDER — CETIRIZINE HCL 10 MG PO TABS
10.0000 mg | ORAL_TABLET | Freq: Every day | ORAL | 0 refills | Status: AC
  Filled 2024-06-14: qty 30, 30d supply, fill #0

## 2024-06-14 MED ORDER — ESCITALOPRAM OXALATE 10 MG PO TABS
10.0000 mg | ORAL_TABLET | Freq: Every day | ORAL | 0 refills | Status: AC
  Filled 2024-06-14: qty 30, 30d supply, fill #0

## 2024-06-14 MED ORDER — IRBESARTAN 150 MG PO TABS
150.0000 mg | ORAL_TABLET | Freq: Every day | ORAL | 0 refills | Status: AC
  Filled 2024-06-14: qty 30, 30d supply, fill #0

## 2024-06-14 MED ORDER — ONDANSETRON HCL 4 MG PO TABS
4.0000 mg | ORAL_TABLET | Freq: Three times a day (TID) | ORAL | Status: DC | PRN
  Administered 2024-06-14: 4 mg via ORAL
  Filled 2024-06-14: qty 1

## 2024-06-14 MED ORDER — METHOCARBAMOL 500 MG PO TABS
500.0000 mg | ORAL_TABLET | Freq: Three times a day (TID) | ORAL | 0 refills | Status: DC | PRN
  Filled 2024-06-14: qty 30, 10d supply, fill #0

## 2024-06-14 MED ORDER — MEMANTINE HCL 5 MG PO TABS
5.0000 mg | ORAL_TABLET | Freq: Two times a day (BID) | ORAL | 0 refills | Status: AC
  Filled 2024-06-14: qty 60, 30d supply, fill #0

## 2024-06-14 MED ORDER — MIRTAZAPINE 15 MG PO TABS
15.0000 mg | ORAL_TABLET | Freq: Every day | ORAL | 0 refills | Status: AC
  Filled 2024-06-14: qty 30, 30d supply, fill #0

## 2024-06-14 NOTE — Plan of Care (Signed)
  Problem: RH Bed Mobility Goal: LTG Patient will perform bed mobility with assist (PT) Description: LTG: Patient will perform bed mobility with assistance, with/without cues (PT). Outcome: Not Met (add Reason) Flowsheets (Taken 06/14/2024 1735) LTG: Pt will perform bed mobility with assistance level of: (partially met; sit to supine min A - self-limiting behaviors/weakness) --   Problem: RH Balance Goal: LTG Patient will maintain dynamic sitting balance (PT) Description: LTG:  Patient will maintain dynamic sitting balance with assistance during mobility activities (PT) Outcome: Completed/Met Goal: LTG Patient will maintain dynamic standing balance (PT) Description: LTG:  Patient will maintain dynamic standing balance with assistance during mobility activities (PT) Outcome: Completed/Met   Problem: Sit to Stand Goal: LTG:  Patient will perform sit to stand with assistance level (PT) Description: LTG:  Patient will perform sit to stand with assistance level (PT) Outcome: Completed/Met   Problem: RH Bed to Chair Transfers Goal: LTG Patient will perform bed/chair transfers w/assist (PT) Description: LTG: Patient will perform bed to chair transfers with assistance (PT). Outcome: Completed/Met   Problem: RH Car Transfers Goal: LTG Patient will perform car transfers with assist (PT) Description: LTG: Patient will perform car transfers with assistance (PT). Outcome: Completed/Met   Problem: RH Ambulation Goal: LTG Patient will ambulate in controlled environment (PT) Description: LTG: Patient will ambulate in a controlled environment, # of feet with assistance (PT). Outcome: Completed/Met Goal: LTG Patient will ambulate in home environment (PT) Description: LTG: Patient will ambulate in home environment, # of feet with assistance (PT). Outcome: Completed/Met   Problem: RH Stairs Goal: LTG Patient will ambulate up and down stairs w/assist (PT) Description: LTG: Patient will ambulate up  and down # of stairs with assistance (PT) Outcome: Completed/Met   Problem: RH Wheelchair Mobility Goal: LTG Patient will propel w/c in controlled environment (PT) Description: LTG: Patient will propel wheelchair in controlled environment, # of feet with assist (PT) Outcome: Not Applicable Flowsheets (Taken 06/14/2024 1735) LTG: Pt will propel w/c in controlled environ  assist needed:: (pt not going home with WC) -- Goal: LTG Patient will propel w/c in home environment (PT) Description: LTG: Patient will propel wheelchair in home environment, # of feet with assistance (PT). Outcome: Not Applicable Flowsheets (Taken 06/14/2024 1735) LTG: Pt will propel w/c in home environ  assist needed:: (pt not going home with WC) --

## 2024-06-14 NOTE — Progress Notes (Signed)
 Inpatient Rehabilitation Discharge Medication Review by a Pharmacist  A complete drug regimen review was completed for this patient to identify any potential clinically significant medication issues.  High Risk Drug Classes Is patient taking? Indication by Medication  Antipsychotic No   Anticoagulant Yes Apixaban - Afib   Antibiotic No   Opioid No   Antiplatelet No   Hypoglycemics/insulin No   Vasoactive Medication Yes Irbesartan, amlodipine - HTN  Chemotherapy No   Other Yes Doxepin, mirtazapine  - mood, sleep Escitalopram - depression Atorvastatin - HLD Levothyroxine  - low thyroid  Memantine  - dementia Methocarbamol  - prn muscle spasms Omeprazole  - reflux      Type of Medication Issue Identified Description of Issue Recommendation(s)  Drug Interaction(s) (clinically significant)     Duplicate Therapy     Allergy     No Medication Administration End Date     Incorrect Dose     Additional Drug Therapy Needed     Significant med changes from prior encounter (inform family/care partners about these prior to discharge).    Other       Clinically significant medication issues were identified that warrant physician communication and completion of prescribed/recommended actions by midnight of the next day:  No  Name of provider notified for urgent issues identified:   Provider Method of Notification:     Pharmacist comments: None  Time spent performing this drug regimen review (minutes):  20 minutes  Thank you. Olam Monte, PharmD

## 2024-06-14 NOTE — Plan of Care (Signed)
  Problem: RH Vision Goal: RH LTG Vision (Specify) Outcome: Progressing   Problem: Consults Goal: RH GENERAL PATIENT EDUCATION Description: See Patient Education module for education specifics. Outcome: Progressing Goal: Skin Care Protocol Initiated - if Braden Score 18 or less Description: If consults are not indicated, leave blank or document N/A Outcome: Progressing Goal: Nutrition Consult-if indicated Outcome: Progressing Goal: Diabetes Guidelines if Diabetic/Glucose > 140 Description: If diabetic or lab glucose is > 140 mg/dl - Initiate Diabetes/Hyperglycemia Guidelines & Document Interventions  Outcome: Progressing   Problem: RH BOWEL ELIMINATION Goal: RH STG MANAGE BOWEL WITH ASSISTANCE Description: STG Manage Bowel with Assistance. Outcome: Progressing Goal: RH STG MANAGE BOWEL W/MEDICATION W/ASSISTANCE Description: STG Manage Bowel with Medication with Assistance. Outcome: Progressing   Problem: RH BLADDER ELIMINATION Goal: RH STG MANAGE BLADDER WITH ASSISTANCE Description: STG Manage Bladder With Assistance Outcome: Progressing Goal: RH STG MANAGE BLADDER WITH MEDICATION WITH ASSISTANCE Description: STG Manage Bladder With Medication With Assistance. Outcome: Progressing Goal: RH STG MANAGE BLADDER WITH EQUIPMENT WITH ASSISTANCE Description: STG Manage Bladder With Equipment With Assistance Outcome: Progressing   Problem: RH SKIN INTEGRITY Goal: RH STG SKIN FREE OF INFECTION/BREAKDOWN Outcome: Progressing Goal: RH STG MAINTAIN SKIN INTEGRITY WITH ASSISTANCE Description: STG Maintain Skin Integrity With Assistance. Outcome: Progressing Goal: RH STG ABLE TO PERFORM INCISION/WOUND CARE W/ASSISTANCE Description: STG Able To Perform Incision/Wound Care With Assistance. Outcome: Progressing   Problem: RH SAFETY Goal: RH STG ADHERE TO SAFETY PRECAUTIONS W/ASSISTANCE/DEVICE Description: STG Adhere to Safety Precautions With Assistance/Device. Outcome:  Progressing Goal: RH STG DECREASED RISK OF FALL WITH ASSISTANCE Description: STG Decreased Risk of Fall With Assistance. Outcome: Progressing   Problem: RH PAIN MANAGEMENT Goal: RH STG PAIN MANAGED AT OR BELOW PT'S PAIN GOAL Outcome: Progressing   Problem: RH KNOWLEDGE DEFICIT GENERAL Goal: RH STG INCREASE KNOWLEDGE OF SELF CARE AFTER HOSPITALIZATION Outcome: Progressing   Problem: RH Vision Goal: RH LTG Vision (Specify) Outcome: Progressing   Problem: RH Pre-functional/Other (Specify) Goal: RH LTG Pre-functional (Specify) Outcome: Progressing Goal: RH LTG Interdisciplinary (Specify) 1 Description: RH LTG Interdisciplinary (Specify)1 Outcome: Progressing Goal: RH LTG Interdisciplinary (Specify) 2 Description: RH LTG Interdisciplinary (Specify) 2 Outcome: Progressing

## 2024-06-14 NOTE — Plan of Care (Signed)
  Problem: RH Problem Solving Goal: LTG Patient will demonstrate problem solving for (SLP) Description: LTG:  Patient will demonstrate problem solving for basic/complex daily situations with cues  (SLP) Outcome: Completed/Met   Problem: RH Memory Goal: LTG Patient will use memory compensatory aids to (SLP) Description: LTG:  Patient will use memory compensatory aids to recall biographical/new, daily complex information with cues (SLP) Outcome: Completed/Met   Problem: RH Attention Goal: LTG Patient will demonstrate this level of attention during functional activites (SLP) Description: LTG:  Patient will will demonstrate this level of attention during functional activites (SLP) Outcome: Completed/Met   Problem: RH Awareness Goal: LTG: Patient will demonstrate awareness during functional activites type of (SLP) Description: LTG: Patient will demonstrate awareness during functional activites type of (SLP) Outcome: Not Met (cont to require occasional modA)

## 2024-06-14 NOTE — Progress Notes (Addendum)
 Patient ID: Donna Petty, female   DOB: 1935/08/14, 88 y.o.   MRN: [id number]  Spoke with patient's daughter [name] regarding an update. She is now feeling concerned about her mother discharging and would like to speak with the doctor about it. SW informed Dr. Murray.   SW will order DME as applicable and will reinstate HH.   UPDATE:  3-1 commode ordered through [employer]. Should be delivered between today and tomorrow morning prior to discharge.

## 2024-06-14 NOTE — Progress Notes (Signed)
 Physical Therapy Discharge Summary  Patient Details  Name: Donna Petty MRN: [id number] Date of Birth: September 08, 1935  Date of Discharge from PT service:June 14, 2024  Patient has met 8 of 11 long term goals due to improved activity tolerance, improved balance, improved postural control, increased strength, decreased pain, ability to compensate for deficits, and improved attention.  Patient to discharge at an ambulatory level Supervision/CGA.   Patient's care partner is independent to provide the necessary physical and cognitive assistance at discharge.  Reasons goals not met: Pt did not meet her bed mobility goal due to sit to supine transfers being min A due to self-limiting behaviors. Pt did not meet her WC mobility goals and therapy focus was on ambulation as WC will not fit inside her mobile home.  Recommendation:  Patient will benefit from ongoing skilled PT services in home health setting to continue to advance safe functional mobility, address ongoing impairments in strength, endurance, transfers, ambulation, balance, L inattention, stair negotiation, and minimize fall risk.  Equipment: Pt has RW, rollator, and cane.  Reasons for discharge: treatment goals met and discharge from hospital  Patient/family agrees with progress made and goals achieved: Yes  PT Discharge Precautions/Restrictions Precautions Precautions: Fall Recall of Precautions/Restrictions: Impaired Precaution/Restrictions Comments: L neglect Restrictions Weight Bearing Restrictions Per Provider Order: No Vital Signs Therapy Vitals Pulse Rate: 65 BP: 136/71 Patient Position (if appropriate): Sitting Pain Pain Assessment Pain Scale: 0-10 Pain Score: 0-No pain Pain Interference Pain Interference Pain Effect on Sleep: 1. Rarely or not at all Pain Interference with Therapy Activities: 1. Rarely or not at all Pain Interference with Day-to-Day Activities: 1. Rarely or not at all Vision/Perception  Vision  - History Ability to See in Adequate Light: 0 Adequate Perception Perception: Impaired Preception Impairment Details: Inattention/Neglect Perception-Other Comments: L  Cognition Overall Cognitive Status: Within Functional Limits for tasks assessed Arousal/Alertness: Awake/alert Orientation Level: Oriented to person Attention: Sustained Sustained Attention: Appears intact Memory: Impaired Awareness: Impaired Problem Solving: Impaired Safety/Judgment: Impaired Sensation Sensation Light Touch: Impaired by gross assessment Proprioception: Impaired by gross assessment Additional Comments: Light touch limited in L lateral thigh and heel Coordination Gross Motor Movements are Fluid and Coordinated: No Motor  Motor Motor: Hemiplegia;Abnormal postural alignment and control Motor - Skilled Clinical Observations: L hemiparesis and L inattention  Mobility Bed Mobility Bed Mobility: Rolling Right;Rolling Left;Supine to Sit;Sit to Supine Rolling Right: Independent with assistive device (bed rails) Rolling Left: Independent with assistive device (bed rails) Supine to Sit: Supervision/Verbal cueing (bed rails) Sit to Supine: Minimal Assistance - Patient > 75% (for L LE) Transfers Transfers: Sit to Stand;Stand to Sit;Stand Pivot Transfers Sit to Stand: Supervision/Verbal cueing (RW; verbal cueing for push off) Stand to Sit: Supervision/Verbal cueing (RW; verbal cueing for controlled descent) Stand Pivot Transfers: Supervision/Verbal cueing Stand Pivot Transfer Details: Verbal cues for precautions/safety;Verbal cues for technique;Verbal cues for sequencing;Verbal cues for gait pattern;Verbal cues for safe use of DME/AE Transfer (Assistive device): Rolling walker Locomotion  Gait Ambulation: Yes Gait Assistance: Supervision/Verbal cueing;Contact Guard/Touching assist (overall close SBA with verbal cues; CGA for longer distances) Gait Distance (Feet): 150 Feet Assistive device: Rolling  walker Gait Assistance Details: Verbal cues for safe use of DME/AE;Verbal cues for gait pattern;Verbal cues for sequencing;Verbal cues for technique;Verbal cues for precautions/safety Gait Assistance Details: also req cues for attention to L Gait Gait: Yes Gait Pattern: Impaired Gait Pattern: Step-to pattern;Decreased step length - right;Decreased step length - left;Decreased stance time - right;Decreased stance time - left;Decreased stride length;Decreased hip/knee  flexion - right;Decreased hip/knee flexion - left;Decreased dorsiflexion - right;Decreased dorsiflexion - left;Trunk flexed;Decreased trunk rotation;Poor foot clearance - right;Poor foot clearance - left Gait velocity: decreased Stairs / Additional Locomotion Stairs: Yes Stairs Assistance: Contact Guard/Touching assist (veral cues for safety) Stair Management Technique: Two rails Number of Stairs: 8 Height of Stairs: 6 Ramp: Minimal Assistance - Patient >75% Curb: Contact Guard/Touching assist Pick up small object from the floor assist level: Supervision/Verbal cueing Pick up small object from the floor assistive device: [employer] Wheelchair Mobility: No  Trunk/Postural Assessment  Cervical Assessment Cervical Assessment: Exceptions to [facility] (forward head) Thoracic Assessment Thoracic Assessment: Exceptions to [facility] (rounded shoulder; kyphotic) Lumbar Assessment Lumbar Assessment: Exceptions to [facility] (sacral sitting; posterior pelvic tilt) Postural Control Postural Control: Deficits on evaluation  Balance Balance Balance Assessed: Yes Static Sitting Balance Static Sitting - Balance Support: Feet supported;No upper extremity supported Dynamic Sitting Balance Dynamic Sitting - Balance Support: Feet supported Dynamic Sitting - Level of Assistance: 6: Modified independent (Device/Increase time) Static Standing Balance Static Standing - Balance Support: Bilateral upper extremity supported Static Standing -  Level of Assistance: 5: Stand by assistance Dynamic Standing Balance Dynamic Standing - Balance Support: Right upper extremity supported;Left upper extremity supported Dynamic Standing - Level of Assistance: 5: Stand by assistance Extremity Assessment  RLE Assessment RLE Assessment: Exceptions to [facility] General Strength Comments: Ankle DF/PF 4-/5; knee ext 4/5; knee flex 4-/5; hip grossly 4-/5 LLE Assessment LLE Assessment: Exceptions to [facility] General Strength Comments: DF 2-/5; PF 3-/5; knee ext 3/5; knee flex 3-/5; hip grossly 3-/5   [name], PT, DPT 06/14/2024, 10:28 AM

## 2024-06-14 NOTE — Progress Notes (Signed)
 Physical Therapy Session Note  Patient Details  Name: Donna Petty MRN: [id number] Date of Birth: 1935-09-13  Today's Date: 06/14/2024 PT Individual Time: 0900-1000 PT Individual Time Calculation (min): 60 min  Short Term Goals: Week 2:  PT Short Term Goal 1 (Week 2): STG=LTG due to ELOS  Skilled Therapeutic Interventions/Progress Updates:     Pt seated in WC upon arrival. Pt complaining of indigestion, requesting TUMS from nursing. Denies pain otherwise. Agreeable to therapy. Session emphasized transfers, ambulation, bed mobility, and DC planning. Pt transported dependent in [facility] to main gym for time/energy conservation. Pt asc/desc 8-6 inch steps using B HR with CGA and verbal cueing. Pt ambulated ~150 ft using RW with SBA from initial ~25 ft and CGA for remaining distance. Verbal cueing required to maintain safe distance from RW. Pt participated in stand pivot transfers WC <> car simulator on lower setting with CGA overall and verbal cueing. PT recommends that pt go home in [facility] vs higher SUV. Pt asc/desc 10 ft ramp using RW with min A. Standing in RW with supervision, pt utilized reacher to pick cone up from floor with min A for coordination. Upon return to room, pt transferred back to EOB CGA. Pt required min A for sit to supine for L LE - pt sat for several minutes without initiation of task despite verbal cueing for task - pt did not begin the transfer until PT provided assist for L LE. Pt performed bilat rolling in bed with mod I. Pt used bed rails for all bed mobility. Significant verbal cueing and encouragement required throughout session due to self-limiting behaviors. Pt supine, bed alarm on, and all needs within reach at end of session.  Therapy Documentation Precautions:  Precautions Precautions: Fall Recall of Precautions/Restrictions: Impaired Precaution/Restrictions Comments: L neglect Restrictions Weight Bearing Restrictions Per Provider Order: No  Therapy/Group:  Individual Therapy  [name], PT, DPT 06/14/2024, 10:02 AM

## 2024-06-14 NOTE — Progress Notes (Deleted)
 Inpatient Rehabilitation Discharge Medication Review by a Pharmacist  A complete drug regimen review was completed for this patient to identify any potential clinically significant medication issues.  High Risk Drug Classes Is patient taking? Indication by Medication  Antipsychotic No   Anticoagulant Yes Apixaban - Afib   Antibiotic No   Opioid No   Antiplatelet No   Hypoglycemics/insulin No   Vasoactive Medication Yes Irbesartan, amlodipine - HTN  Chemotherapy No   Other Yes Doxepin, mirtazapine  - mood, sleep Escitalopram - depression Atorvastatin - HLD Levothyroxine  - low thyroid  Memantine  - dementia Methocarbamol  - prn muscle spasms Omeprazole  - reflux      Type of Medication Issue Identified Description of Issue Recommendation(s)  Drug Interaction(s) (clinically significant)     Duplicate Therapy     Allergy     No Medication Administration End Date     Incorrect Dose     Additional Drug Therapy Needed     Significant med changes from prior encounter (inform family/care partners about these prior to discharge).    Other       Clinically significant medication issues were identified that warrant physician communication and completion of prescribed/recommended actions by midnight of the next day:  No  Name of provider notified for urgent issues identified:   Provider Method of Notification:     Pharmacist comments: None  Time spent performing this drug regimen review (minutes):  20 minutes  Thank you. Olam Monte, PharmD

## 2024-06-14 NOTE — Progress Notes (Signed)
 Speech Language Pathology Daily Session Note  Patient Details  Name: Donna Petty MRN: [id number] Date of Birth: [birth year]  Today's Date: 06/14/2024 SLP Individual Time: 1300-1335 SLP Individual Time Calculation (min): 35 min  Short Term Goals: Week 2: SLP Short Term Goal 1 (Week 2): STG = LTG due to ELOS  Skilled Therapeutic Interventions: Skilled therapy session focused on cognitive goals. Patient oriented x4. SLP facilitated session by targeting problem solving and memory goals. Patient recalled [facility] stroke s/sx educated in prior session independently. Patient agreeable to participating in hospital navigation task and transferred to [facility] from bed. Patient with x1 episode of emesis, though reports wanting to continue activity. Patient began to navigate around hospital with minA to sustain attention to L while reading signs. Patient then reporting increased nausea and requested return to room. Nursing aware of emesis. Remaining 25 minutes of session missed due to patient illness. Patient left in [facility] with alarm set and call bell in reach. Continue POC  Pain Emesis - nursing aware  Therapy/Group: Individual Therapy  Chayanne Filippi M.A., CCC-SLP 06/14/2024, 12:12 PM

## 2024-06-14 NOTE — Plan of Care (Signed)
°  Problem: Consults Goal: RH GENERAL PATIENT EDUCATION Description: See Patient Education module for education specifics. Outcome: Progressing Goal: Skin Care Protocol Initiated - if Braden Score 18 or less Description: If consults are not indicated, leave blank or document N/A Outcome: Progressing Goal: Nutrition Consult-if indicated Outcome: Progressing Goal: Diabetes Guidelines if Diabetic/Glucose > 140 Description: If diabetic or lab glucose is > 140 mg/dl - Initiate Diabetes/Hyperglycemia Guidelines & Document Interventions  Outcome: Progressing   Problem: RH BOWEL ELIMINATION Goal: RH STG MANAGE BOWEL WITH ASSISTANCE Description: STG Manage Bowel with Assistance. Outcome: Progressing Goal: RH STG MANAGE BOWEL W/MEDICATION W/ASSISTANCE Description: STG Manage Bowel with Medication with Assistance. Outcome: Progressing   Problem: RH BLADDER ELIMINATION Goal: RH STG MANAGE BLADDER WITH ASSISTANCE Description: STG Manage Bladder With Assistance Outcome: Progressing Goal: RH STG MANAGE BLADDER WITH MEDICATION WITH ASSISTANCE Description: STG Manage Bladder With Medication With Assistance. Outcome: Progressing Goal: RH STG MANAGE BLADDER WITH EQUIPMENT WITH ASSISTANCE Description: STG Manage Bladder With Equipment With Assistance Outcome: Progressing   Problem: RH SKIN INTEGRITY Goal: RH STG SKIN FREE OF INFECTION/BREAKDOWN Outcome: Progressing Goal: RH STG MAINTAIN SKIN INTEGRITY WITH ASSISTANCE Description: STG Maintain Skin Integrity With Assistance. Outcome: Progressing Goal: RH STG ABLE TO PERFORM INCISION/WOUND CARE W/ASSISTANCE Description: STG Able To Perform Incision/Wound Care With Assistance. Outcome: Progressing   Problem: RH SAFETY Goal: RH STG ADHERE TO SAFETY PRECAUTIONS W/ASSISTANCE/DEVICE Description: STG Adhere to Safety Precautions With Assistance/Device. Outcome: Progressing Goal: RH STG DECREASED RISK OF FALL WITH ASSISTANCE Description: STG  Decreased Risk of Fall With Assistance. Outcome: Progressing   Problem: RH PAIN MANAGEMENT Goal: RH STG PAIN MANAGED AT OR BELOW PT'S PAIN GOAL Outcome: Progressing   Problem: RH KNOWLEDGE DEFICIT GENERAL Goal: RH STG INCREASE KNOWLEDGE OF SELF CARE AFTER HOSPITALIZATION Outcome: Progressing   Problem: RH Vision Goal: RH LTG Vision (Specify) Outcome: Progressing   Problem: RH Pre-functional/Other (Specify) Goal: RH LTG Pre-functional (Specify) Outcome: Progressing Goal: RH LTG Interdisciplinary (Specify) 1 Description: RH LTG Interdisciplinary (Specify)1 Outcome: Progressing Goal: RH LTG Interdisciplinary (Specify) 2 Description: RH LTG Interdisciplinary (Specify) 2  Outcome: Progressing   

## 2024-06-14 NOTE — Progress Notes (Signed)
 Occupational Therapy Session Note  Patient Details  Name: WESTON FULCO MRN: [id number] Date of Birth: 02-Apr-1936  Today's Date: 06/14/2024 OT Individual Time: 9269-9154 OT Individual Time Calculation (min): 75 min    Short Term Goals: Week 2:  OT Short Term Goal 1 (Week 2): patient will complete LB dressing with Mod assist OT Short Term Goal 2 (Week 2): Patient will perform toilet transfer with CGA OT Short Term Goal 3 (Week 2): Patient will perform grooming standing at sink with CGA  Skilled Therapeutic Interventions/Progress Updates:    Patient agreeable to participate in OT session. Reports 0/10 pain level.   Patient participated in skilled OT session focusing on ADL completion, d/c planning, functional mobility. Patient received in bed. Completed bed mobility with SUP A. Transfer to wc SUP A. Patient then able to complete all grooming self care at sink. UB bathing and dressing SU to SUP. LB bathing and dressing CG to SUP. Patient then able to complete all grooming mod I to SU sitting in wc at sink. Patient requested to complete toileting. Patient completed functional ambulation into bathroom sup A with RW for toilet transfer. Toileting completed with CG to min A for 3/3 utilizing grab bars. Patient then returned to wc via ambulation to wash hands sitting at sink. Patient required increased rest breaks and increased time today 2/2 fatigue, however able to complete with lower level of assistance. Returned to wc all needs in reach alarm on.   Therapy Documentation Precautions:  Precautions Precautions: Fall Recall of Precautions/Restrictions: Impaired Precaution/Restrictions Comments: L neglect Restrictions Weight Bearing Restrictions Per Provider Order: No  Therapy/Group: Individual Therapy  D'mariea L Falisa Lamora 06/14/2024, 8:44 AM

## 2024-06-14 NOTE — Progress Notes (Signed)
 PROGRESS NOTE   Subjective/Complaints: No new complaints/concerns noted today. Daughter coming for family education today.   ROS: Limited due to cognitive/behavioral   Objective:   No results found. Recent Labs    06/13/24 0502  WBC 7.4  HGB 12.7  HCT 38.5  PLT 310     Recent Labs    06/13/24 0502  NA 142  K 4.1  CL 108  CO2 25  GLUCOSE 90  BUN 19  CREATININE 1.29*  CALCIUM  8.9      Intake/Output Summary (Last 24 hours) at 06/14/2024 1243 Last data filed at 06/13/2024 1912 Gross per 24 hour  Intake 600 ml  Output --  Net 600 ml          Physical Exam: Vital Signs Blood pressure 136/71, pulse 65, temperature 97.9 F (36.6 C), temperature source Oral, resp. rate 18, height 5' 1 (1.549 m), weight 74.3 kg, SpO2 95%.  Constitutional: No distress . Vital signs reviewed.  Sitting in wheelchair working with therapy in her room HEENT: NCAT, EOMI, oral membranes moist Neck: supple Cardiovascular: RRR without murmur. No JVD    Respiratory/Chest: CTA Bilaterally without wheezes or rales. Normal effort    GI/Abdomen: BS +, non-tender, non-distended Ext: no clubbing, cyanosis, or edema Psych: Flat Skin: Many bruises throughout esp LUE distally and R inner thigh  Neuro:  alert and oriented x3, Right gaze preference but can look left.  L hemianopia  -had german measles as child affecting l eye and macular degeneration normal language  MOTOR: RUE and RLE 5/5, other than Ankle DF 4/5- may be effort limited LUE/LLE- 5-/5 throughout  other than Ankle DF 4/5- may be effort limited, stable 11/9  No hypertonia noted   Prior neuro assessment is c/w today's exam 06/14/2024.    Assessment/Plan: 1. Functional deficits which require 3+ hours per day of interdisciplinary therapy in a comprehensive inpatient rehab setting. Physiatrist is providing close team supervision and 24 hour management of active medical  problems listed below. Physiatrist and rehab team continue to assess barriers to discharge/monitor patient progress toward functional and medical goals  Care Tool:  Bathing    Body parts bathed by patient: Right arm, Left arm, Chest, Front perineal area, Abdomen, Right upper leg, Left upper leg, Face, Buttocks, Right lower leg, Left lower leg   Body parts bathed by helper: Right lower leg, Left lower leg     Bathing assist Assist Level: Supervision/Verbal cueing     Upper Body Dressing/Undressing Upper body dressing   What is the patient wearing?: Pull over shirt    Upper body assist Assist Level: Independent with assistive device    Lower Body Dressing/Undressing Lower body dressing      What is the patient wearing?: Incontinence brief, Pants     Lower body assist Assist for lower body dressing: Contact Guard/Touching assist     Toileting Toileting    Toileting assist Assist for toileting: Contact Guard/Touching assist     Transfers Chair/bed transfer  Transfers assist     Chair/bed transfer assist level: Supervision/Verbal cueing     Locomotion Ambulation   Ambulation assist      Assist level: Contact Guard/Touching assist  Assistive device: Walker-rolling Max distance: 159   Walk 10 feet activity   Assist     Assist level: Supervision/Verbal cueing Assistive device: Walker-rolling   Walk 50 feet activity   Assist Walk 50 feet with 2 turns activity did not occur: Safety/medical concerns (weakness/fatigue/pain)  Assist level: Contact Guard/Touching assist Assistive device: Walker-rolling    Walk 150 feet activity   Assist Walk 150 feet activity did not occur: Safety/medical concerns (weakness/fatigue/pain)  Assist level: Contact Guard/Touching assist Assistive device: Walker-rolling    Walk 10 feet on uneven surface  activity   Assist Walk 10 feet on uneven surfaces activity did not occur: Safety/medical concerns  (weakness/fatigue/pain)   Assist level: Minimal Assistance - Patient > 75% Assistive device: Walker-rolling   Wheelchair     Assist Is the patient using a wheelchair?: No Type of Wheelchair: Manual Wheelchair activity did not occur: Refused  Wheelchair assist level: Contact Guard/Touching assist Max wheelchair distance: 50 ft    Wheelchair 50 feet with 2 turns activity    Assist    Wheelchair 50 feet with 2 turns activity did not occur: Refused       Wheelchair 150 feet activity     Assist  Wheelchair 150 feet activity did not occur: Refused       Blood pressure 136/71, pulse 65, temperature 97.9 F (36.6 C), temperature source Oral, resp. rate 18, height 5' 1 (1.549 m), weight 74.3 kg, SpO2 95%.  Medical Problem List and Plan: 1. Functional deficits secondary to right PCA large infarct with multiple small scattered infarcts in bilateral hemispheres with R P1 occlusion status post IR but unsuccessful due to tortuosity of vessel             -patient may  shower             -ELOS/Goals: 14-16 days supervision of PT, OT and SLP            -Continue CIR therapies including PT, OT, and SLP   -agree with palliative care reassessment of goals of care-patient remains DNR we will plan for continued scope of care  - Expected discharge 11/12, called her daughter to answer questions.  Daughter asking about possibility of more days.  Will discuss with team today  2.  Antithrombotics: -DVT/anticoagulation:  Pharmaceutical: continue Eliquis for A flutter             -antiplatelet therapy: N/A  3. Pain Management: continue Robaxin  500 mg every 8 hours as needed 4. Mood/Behavior/Sleep: Namenda  5 mg twice daily, Remeron  15 mg nightly, Xanax 0.25 mg twice daily as needed             -antipsychotic agents: seroquel prn--dc  -Started lexapro 10mg  daily  11/7-followed by psychiatry--doxepin suggested for sleep--will start  -team providing ego support as possible  -11/10  appears to be doing a little better from mood perspective than last week continue current regimen 5. Neuropsych/cognition: This patient is not capable of making decisions on her own behalf due to vascular dementia 6. Skin/Wound Care: Routine skin checks 7. Fluids/Electrolytes/Nutrition: encourage appropriate po 8.  Hypertension.  Continue Norvasc to 10mg  daily. Monitor with increased mobility    06/14/2024    7:00 AM 06/14/2024    4:09 AM 06/13/2024    8:18 PM  Vitals with BMI  Systolic 136 173 875  Diastolic 71 69 67  Pulse 65 52 83   -11/2 add PRN hydralazine  10mg  PRN SBP >160 Increase Avapro to 150mg  daily -11/11 BP remains  a little labile but overall stable with intermittent elevations.  Continue to monitor trend  9.  Hyperlipidemia. conitnue Lipitor 10.  GERD.  Protonix  11.  Hypothyroidism.  Synthroid  100 mcg daily 12. Hx of german measles of L eye as child and macular degeneration- not clear why cannot see out of L eye- could be old vs hemianopia 13. Vascular dementia- continue namenda   14. Hx of L THA in 01/2024- and prior L TKA  15. Aflutter- continue Eliquis- doesn't need rate control per Cards  11/11 heart rate stable overall   17. CKD3a: creatinine reviewed and has been stable  - 11/10 creatinine/BUN stable at 1.29/19      LOS: 12 days A FACE TO FACE EVALUATION WAS PERFORMED  Murray Collier 06/14/2024, 12:43 PM

## 2024-06-14 NOTE — Progress Notes (Signed)
 Speech Language Pathology Discharge Summary  Patient Details  Name: Donna Petty MRN: [id number] Date of Birth: [birth year]  Date of Discharge from SLP service:June 14, 2024   Patient has met 3 of 4 long term goals.  Patient to discharge at [facility] level.  Reasons goals not met: cont to require increased awareness to recall cognitive deficits   Clinical Impression/Discharge Summary: Pt has made good gains and has met 3 of 4 LTG's this admission due to improved memory, attention and problem solving skills. Pt is currently an overall minA for cognitive tasks with the exception of intellectual awareness of cognitive changes. Patient gains were limited by inconsistent participation in ST tasks. Pt/family education complete and pt will discharge home with 24 hour supervision from friends/family/etc. Pt would benefit from f/u ST services to maximize cognition in order to maximize functional independence.   Care Partner:  Caregiver Able to Provide Assistance: Yes  Type of Caregiver Assistance: Cognitive  Recommendation:  Home Health SLP  Rationale for SLP Follow Up: Maximize cognitive function and independence   Equipment: n/a   Reasons for discharge: Discharged from hospital   Patient/Family Agrees with Progress Made and Goals Achieved: Yes    Adolphe Fortunato M.A., CCC-SLP 06/14/2024, 1:48 PM

## 2024-06-14 NOTE — Progress Notes (Signed)
 Occupational Therapy Discharge Summary  Patient Details  Name: Donna Petty MRN: [id number] Date of Birth: 1935-11-15  Date of Discharge from OT service:June 14, 2024   Patient has met 10 of 11 long term goals due to improved activity tolerance, improved balance, postural control, ability to compensate for deficits, improved attention, improved awareness, and improved coordination.  Patient to discharge at overall Supervision level.  Patient's care partner is independent to provide the necessary physical assistance at discharge.    Reasons goals not met: patient continues to require physical assistance for LB dressing due to trouble utilizing AE and bending. Patient at a CG to min A level with LB dressing.   Recommendation:  Patient will benefit from ongoing skilled OT services in home health setting to continue to advance functional skills in the area of BADL and Reduce care partner burden.  Equipment: No equipment provided  Reasons for discharge: treatment goals met and discharge from hospital  Patient/family agrees with progress made and goals achieved: Yes  OT Discharge Precautions/Restrictions  Precautions Precautions: Fall Recall of Precautions/Restrictions: Impaired Precaution/Restrictions Comments: L neglect Restrictions Weight Bearing Restrictions Per Provider Order: No General   Vital Signs   Pain Pain Assessment Pain Scale: 0-10 Pain Score: 0-No pain ADL ADL Equipment Provided: Reacher Eating: Set up Where Assessed-Eating: Wheelchair Grooming: Setup Where Assessed-Grooming: Sitting at sink Upper Body Bathing: Modified independent Where Assessed-Upper Body Bathing: Sitting at sink Lower Body Bathing: Supervision/safety Where Assessed-Lower Body Bathing: Sitting at sink Upper Body Dressing: Setup Where Assessed-Upper Body Dressing: Chair Lower Body Dressing: Contact guard Where Assessed-Lower Body Dressing: Chair Toileting: Contact guard Where  Assessed-Toileting: Teacher, Adult Education: Furniture Conservator/restorer Method: Proofreader: Grab bars, Raised toilet seat Tub/Shower Transfer: Minimal Nurse, Mental Health: Insurance Underwriter: Administrator, Arts Method: Designer, Industrial/product: Emergency planning/management officer, Grab bars Vision Baseline Vision/History: 1 Wears glasses Vision Assessment?: Vision impaired- to be further tested in functional context Perception  Perception: Impaired Perception-Other Comments: L Praxis Praxis: WFL Cognition Cognition Overall Cognitive Status: Within Functional Limits for tasks assessed Arousal/Alertness: Awake/alert Memory: Impaired Memory Impairment: Decreased long term memory Decreased Long Term Memory: Verbal basic Attention: Sustained Sustained Attention: Appears intact Awareness: Impaired Problem Solving: Impaired Safety/Judgment: Impaired Brief Interview for Mental Status (BIMS) Repetition of Three Words (First Attempt): 3 Temporal Orientation: Year: Correct Temporal Orientation: Month: Accurate within 5 days Temporal Orientation: Day: Correct Recall: Sock: Yes, no cue required Recall: Blue: Yes, no cue required Recall: Bed: Yes, no cue required BIMS Summary Score: 15 Sensation Sensation Light Touch: Impaired by gross assessment Proprioception: Impaired by gross assessment Additional Comments: Light touch limited in L lateral thigh and heel Coordination Gross Motor Movements are Fluid and Coordinated: No Fine Motor Movements are Fluid and Coordinated: No Coordination and Movement Description: undershooting Motor  Motor Motor: Hemiplegia;Abnormal postural alignment and control Motor - Skilled Clinical Observations: L hemiparesis and L inattention Mobility  Bed Mobility Bed Mobility: Rolling Right;Rolling Left;Supine to Sit;Sit to Supine Rolling Right: Independent with assistive  device Rolling Left: Independent with assistive device Supine to Sit: Supervision/Verbal cueing Sit to Supine: Minimal Assistance - Patient > 75% Transfers Sit to Stand: Supervision/Verbal cueing Stand to Sit: Supervision/Verbal cueing  Trunk/Postural Assessment  Cervical Assessment Cervical Assessment: Exceptions to [facility] Thoracic Assessment Thoracic Assessment: Exceptions to [facility] Lumbar Assessment Lumbar Assessment: Exceptions to [facility] Postural Control Postural Control: Deficits on evaluation  Balance Balance Balance Assessed: Yes Static Sitting Balance Static Sitting -  Balance Support: Feet supported;No upper extremity supported Dynamic Sitting Balance Dynamic Sitting - Balance Support: Feet supported Dynamic Sitting - Level of Assistance: 6: Modified independent (Device/Increase time) Static Standing Balance Static Standing - Balance Support: Bilateral upper extremity supported Static Standing - Level of Assistance: 5: Stand by assistance Dynamic Standing Balance Dynamic Standing - Balance Support: Right upper extremity supported;Left upper extremity supported Dynamic Standing - Level of Assistance: 5: Stand by assistance Extremity/Trunk Assessment RUE Assessment RUE Assessment: Within Functional Limits Active Range of Motion (AROM) Comments: WFL LUE Assessment LUE Assessment: Exceptions to [facility] Active Range of Motion (AROM) Comments: [facility] Strength Comments: 5/5- decreased coordination   Donna Petty 06/14/2024, 12:32 PM

## 2024-06-15 ENCOUNTER — Other Ambulatory Visit (HOSPITAL_COMMUNITY): Payer: Self-pay

## 2024-06-15 NOTE — Progress Notes (Signed)
 PROGRESS NOTE   Subjective/Complaints: No new concerns today. DC scheduled for today.   ROS: Denies CP, SOB, Abdominal pian, new motor or sensory changes, headache  Objective:   No results found. Recent Labs    06/13/24 0502  WBC 7.4  HGB 12.7  HCT 38.5  PLT 310     Recent Labs    06/13/24 0502  NA 142  K 4.1  CL 108  CO2 25  GLUCOSE 90  BUN 19  CREATININE 1.29*  CALCIUM  8.9      Intake/Output Summary (Last 24 hours) at 06/15/2024 1232 Last data filed at 06/15/2024 0749 Gross per 24 hour  Intake 236 ml  Output --  Net 236 ml          Physical Exam: Vital Signs Blood pressure (!) 146/51, pulse 71, temperature 98.4 F (36.9 C), temperature source Oral, resp. rate 17, height 5' 1 (1.549 m), weight 74.3 kg, SpO2 94%.  Constitutional: No distress . Vital signs reviewed.  Laying in bed HEENT: NCAT, EOMI, oral membranes moist Neck: supple Cardiovascular: RRR without murmur. No JVD    Respiratory/Chest: CTA Bilaterally without wheezes or rales. Normal effort    GI/Abdomen: BS +, non-tender, non-distended Ext: no clubbing, cyanosis, or edema Psych: remains very flat Skin: Many bruises throughout esp LUE distally and R inner thigh  Neuro:  alert and oriented x3, Right gaze preference but can look left.  L hemianopia  -had german measles as child affecting l eye and macular degeneration normal language  MOTOR: RUE and RLE 5/5, other than Ankle DF 4/5- may be effort limited LUE/LLE- 5-/5 throughout  other than Ankle DF 4/5- may be effort limited, stable 11/12  No hypertonia noted   Prior neuro assessment is c/w today's exam 06/15/2024.    Assessment/Plan: 1. Functional deficits which require 3+ hours per day of interdisciplinary therapy in a comprehensive inpatient rehab setting. Physiatrist is providing close team supervision and 24 hour management of active medical problems listed  below. Physiatrist and rehab team continue to assess barriers to discharge/monitor patient progress toward functional and medical goals  Care Tool:  Bathing    Body parts bathed by patient: Right arm, Left arm, Chest, Front perineal area, Abdomen, Right upper leg, Left upper leg, Face, Buttocks, Right lower leg, Left lower leg   Body parts bathed by helper: Right lower leg, Left lower leg     Bathing assist Assist Level: Supervision/Verbal cueing     Upper Body Dressing/Undressing Upper body dressing   What is the patient wearing?: Pull over shirt    Upper body assist Assist Level: Independent with assistive device    Lower Body Dressing/Undressing Lower body dressing      What is the patient wearing?: Incontinence brief, Pants     Lower body assist Assist for lower body dressing: Contact Guard/Touching assist     Toileting Toileting    Toileting assist Assist for toileting: Contact Guard/Touching assist     Transfers Chair/bed transfer  Transfers assist     Chair/bed transfer assist level: Supervision/Verbal cueing     Locomotion Ambulation   Ambulation assist      Assist level: Contact Guard/Touching assist  Assistive device: Walker-rolling Max distance: 159   Walk 10 feet activity   Assist     Assist level: Supervision/Verbal cueing Assistive device: Walker-rolling   Walk 50 feet activity   Assist Walk 50 feet with 2 turns activity did not occur: Safety/medical concerns (weakness/fatigue/pain)  Assist level: Contact Guard/Touching assist Assistive device: Walker-rolling    Walk 150 feet activity   Assist Walk 150 feet activity did not occur: Safety/medical concerns (weakness/fatigue/pain)  Assist level: Contact Guard/Touching assist Assistive device: Walker-rolling    Walk 10 feet on uneven surface  activity   Assist Walk 10 feet on uneven surfaces activity did not occur: Safety/medical concerns  (weakness/fatigue/pain)   Assist level: Minimal Assistance - Patient > 75% Assistive device: Walker-rolling   Wheelchair     Assist Is the patient using a wheelchair?: No Type of Wheelchair: Manual Wheelchair activity did not occur: Orthoptist level: Contact Guard/Touching assist Max wheelchair distance: 50 ft    Wheelchair 50 feet with 2 turns activity    Assist    Wheelchair 50 feet with 2 turns activity did not occur: Refused       Wheelchair 150 feet activity     Assist  Wheelchair 150 feet activity did not occur: Refused       Blood pressure (!) 146/51, pulse 71, temperature 98.4 F (36.9 C), temperature source Oral, resp. rate 17, height 5' 1 (1.549 m), weight 74.3 kg, SpO2 94%.  Medical Problem List and Plan: 1. Functional deficits secondary to right PCA large infarct with multiple small scattered infarcts in bilateral hemispheres with R P1 occlusion status post IR but unsuccessful due to tortuosity of vessel             -patient may  shower             -ELOS/Goals: 14-16 days supervision of PT, OT and SLP            -Continue CIR therapies including PT, OT, and SLP   -agree with palliative care reassessment of goals of care-patient remains DNR we will plan for continued scope of care  - Expected discharge 11/12, called her daughter to answer questions.  Daughter asking about possibility of more days- Discussed with therapy, not felt to benefit from additional days of therapy  -DC home today  2.  Antithrombotics: -DVT/anticoagulation:  Pharmaceutical: continue Eliquis for A flutter             -antiplatelet therapy: N/A  3. Pain Management: continue Robaxin  500 mg every 8 hours as needed 4. Mood/Behavior/Sleep: Namenda  5 mg twice daily, Remeron  15 mg nightly, Xanax 0.25 mg twice daily as needed             -antipsychotic agents: seroquel prn--dc  -Started lexapro 10mg  daily  11/7-followed by psychiatry--doxepin suggested for  sleep--will start  -team providing ego support as possible  -11/10 appears to be doing a little better from mood perspective than last week continue current regimen  -11/12  Denies SI or HI, f/u PCP  5. Neuropsych/cognition: This patient is not capable of making decisions on her own behalf due to vascular dementia 6. Skin/Wound Care: Routine skin checks 7. Fluids/Electrolytes/Nutrition: encourage appropriate po 8.  Hypertension.  Continue Norvasc to 10mg  daily. Monitor with increased mobility      06/15/2024    5:06 AM 06/14/2024    7:08 PM 06/14/2024    3:13 PM  Vitals with BMI  Systolic 146 131 859  Diastolic 51 69 58  Pulse 71 75 78   -11/2 add PRN hydralazine  10mg  PRN SBP >160 Increase Avapro to 150mg  daily -11/11 BP remains a little labile but overall stable with intermittent elevations.  Continue to monitor trend -11/12 fair control, f/u PCP  9.  Hyperlipidemia. conitnue Lipitor 10.  GERD.  Protonix  11.  Hypothyroidism.  Synthroid  100 mcg daily 12. Hx of german measles of L eye as child and macular degeneration- not clear why cannot see out of L eye- could be old vs hemianopia 13. Vascular dementia- continue namenda   14. Hx of L THA in 01/2024- and prior L TKA  15. Aflutter- continue Eliquis- doesn't need rate control per Cards  11/11-12 heart rate stable overall   17. CKD3a: creatinine reviewed and has been stable  - 11/10 creatinine/BUN stable at 1.29/19  -F/u PCP for continued monitoring       LOS: 13 days A FACE TO FACE EVALUATION WAS PERFORMED  Murray Collier 06/15/2024, 12:32 PM

## 2024-06-15 NOTE — Plan of Care (Signed)
 Problem: RH Dressing Goal: LTG Patient will perform lower body dressing w/assist (OT) Description: LTG: Patient will perform lower body dressing with assist, with/without cues in positioning using equipment (OT) Outcome: Not Met (add Reason)   Problem: RH Balance Goal: LTG: Patient will maintain dynamic sitting balance (OT) Description: LTG:  Patient will maintain dynamic sitting balance with assistance during activities of daily living (OT) Outcome: Completed/Met Goal: LTG Patient will maintain dynamic standing with ADLs (OT) Description: LTG:  Patient will maintain dynamic standing balance with assist during activities of daily living (OT)  Outcome: Completed/Met   Problem: Sit to Stand Goal: LTG:  Patient will perform sit to stand in prep for activites of daily living with assistance level (OT) Description: LTG:  Patient will perform sit to stand in prep for activites of daily living with assistance level (OT) Outcome: Completed/Met   Problem: RH Eating Goal: LTG Patient will perform eating w/assist, cues/equip (OT) Description: LTG: Patient will perform eating with assist, with/without cues using equipment (OT) Outcome: Completed/Met   Problem: RH Grooming Goal: LTG Patient will perform grooming w/assist,cues/equip (OT) Description: LTG: Patient will perform grooming with assist, with/without cues using equipment (OT) Outcome: Completed/Met   Problem: RH Bathing Goal: LTG Patient will bathe all body parts with assist levels (OT) Description: LTG: Patient will bathe all body parts with assist levels (OT) Outcome: Completed/Met   Problem: RH Dressing Goal: LTG Patient will perform upper body dressing (OT) Description: LTG Patient will perform upper body dressing with assist, with/without cues (OT). Outcome: Completed/Met   Problem: RH Toileting Goal: LTG Patient will perform toileting task (3/3 steps) with assistance level (OT) Description: LTG: Patient will perform  toileting task (3/3 steps) with assistance level (OT)  Outcome: Completed/Met   Problem: RH Vision Goal: RH LTG Vision (Specify) Outcome: Completed/Met   Problem: RH Toilet Transfers Goal: LTG Patient will perform toilet transfers w/assist (OT) Description: LTG: Patient will perform toilet transfers with assist, with/without cues using equipment (OT) Outcome: Completed/Met   Problem: RH Balance Goal: LTG: Patient will maintain dynamic sitting balance (OT) Description: LTG:  Patient will maintain dynamic sitting balance with assistance during activities of daily living (OT) Outcome: Completed/Met Goal: LTG Patient will maintain dynamic standing with ADLs (OT) Description: LTG:  Patient will maintain dynamic standing balance with assist during activities of daily living (OT)  Outcome: Completed/Met   Problem: Sit to Stand Goal: LTG:  Patient will perform sit to stand in prep for activites of daily living with assistance level (OT) Description: LTG:  Patient will perform sit to stand in prep for activites of daily living with assistance level (OT) Outcome: Completed/Met   Problem: RH Eating Goal: LTG Patient will perform eating w/assist, cues/equip (OT) Description: LTG: Patient will perform eating with assist, with/without cues using equipment (OT) Outcome: Completed/Met   Problem: RH Grooming Goal: LTG Patient will perform grooming w/assist,cues/equip (OT) Description: LTG: Patient will perform grooming with assist, with/without cues using equipment (OT) Outcome: Completed/Met   Problem: RH Bathing Goal: LTG Patient will bathe all body parts with assist levels (OT) Description: LTG: Patient will bathe all body parts with assist levels (OT) Outcome: Completed/Met   Problem: RH Dressing Goal: LTG Patient will perform upper body dressing (OT) Description: LTG Patient will perform upper body dressing with assist, with/without cues (OT). Outcome: Completed/Met Goal: LTG Patient  will perform lower body dressing w/assist (OT) Description: LTG: Patient will perform lower body dressing with assist, with/without cues in positioning using equipment (OT) Outcome: Completed/Met  Problem: RH Toileting Goal: LTG Patient will perform toileting task (3/3 steps) with assistance level (OT) Description: LTG: Patient will perform toileting task (3/3 steps) with assistance level (OT)  Outcome: Completed/Met   Problem: RH Vision Goal: RH LTG Vision (Specify) Outcome: Completed/Met   Problem: RH Functional Use of Upper Extremity Goal: LTG Patient will use RT/LT upper extremity as a (OT) Description: LTG: Patient will use right/left upper extremity as a stabilizer/gross assist/diminished/nondominant/dominant level with assist, with/without cues during functional activity (OT) Outcome: Completed/Met   Problem: RH Simple Meal Prep Goal: LTG Patient will perform simple meal prep w/assist (OT) Description: LTG: Patient will perform simple meal prep with assistance, with/without cues (OT). Outcome: Completed/Met   Problem: RH Full Meal Prep Goal: LTG Patient will perform full meal prep w/assist (OT) Description: LTG: Patient will perform full meal prep with assistance, with/without cues (OT). Outcome: Completed/Met   Problem: RH Laundry Goal: LTG Patient will perform laundry w/assist, cues (OT) Description: LTG: Patient will perform laundry with assistance, with/without cues (OT). Outcome: Completed/Met   Problem: RH Light Housekeeping Goal: LTG Patient will perform light housekeeping w/assist (OT) Description: LTG: Patient will perform light housekeeping with assistance, with/without cues (OT). Outcome: Completed/Met   Problem: RH Toilet Transfers Goal: LTG Patient will perform toilet transfers w/assist (OT) Description: LTG: Patient will perform toilet transfers with assist, with/without cues using equipment (OT) Outcome: Completed/Met   Problem: RH Tub/Shower  Transfers Goal: LTG Patient will perform tub/shower transfers w/assist (OT) Description: LTG: Patient will perform tub/shower transfers with assist, with/without cues using equipment (OT) Outcome: Completed/Met   Problem: RH Furniture Transfers Goal: LTG Patient will perform furniture transfers w/assist (OT/PT) Description: LTG: Patient will perform furniture transfers  with assistance (OT/PT). Outcome: Completed/Met   Problem: RH Memory Goal: LTG Patient will demonstrate ability for day to day recall/carry over during activities of daily living with assistance level (OT) Description: LTG:  Patient will demonstrate ability for day to day recall/carry over during activities of daily living with assistance level (OT). Outcome: Completed/Met   Problem: RH Attention Goal: LTG Patient will demonstrate this level of attention during functional activites (OT) Description: LTG:  Patient will demonstrate this level of attention during functional activites  (OT) Outcome: Completed/Met   Problem: RH Awareness Goal: LTG: Patient will demonstrate awareness during functional activites type of (OT) Description: LTG: Patient will demonstrate awareness during functional activites type of (OT) Outcome: Completed/Met   Problem: RH Pre-functional/Other (Specify) Goal: RH LTG OT (Specify) 1 Description: RH LTG OT (Specify) 1 Outcome: Completed/Met Goal: RH LTG OT (Specify) 2 Description: RH LTG OT (Specify) 2 Outcome: Completed/Met

## 2024-06-15 NOTE — Progress Notes (Signed)
 Cancelled order for 3-1 commode with [employer] online and with laison Mitch.  [employer] will deliver 3-1 commode to patient this morning.

## 2024-06-16 NOTE — Progress Notes (Addendum)
 Inpatient Rehabilitation Care Coordinator Discharge Note   Patient Details  Name: Donna Petty MRN: [id number] Date of Birth: 08-Apr-1936   Discharge location: Home with daughter Donna Petty providing care  Length of Stay: 12 days  Discharge activity level: Supervision/Verbal cueing  Home/community participation: Homebound  Patient response un:Yzjouy Literacy - How often do you need to have someone help you when you read instructions, pamphlets, or other written material from your doctor or pharmacy?: Patient unable to respond  Patient response un:Dnrpjo Isolation - How often do you feel lonely or isolated from those around you?: Patient unable to respond  Services provided included: MD, RD, PT, OT, SLP, RN, CM, TR, Pharmacy, SW  Financial Services:  Field Seismologist Utilized: Private Insurance [employer] / [employer]  Choices offered to/list presented to: Daughter - Donna Petty  Follow-up services arranged:  Home Health Home Health Agency: [facility]         Patient response to transportation need: Is the patient able to respond to transportation needs?: Yes In the past 12 months, has lack of transportation kept you from medical appointments or from getting medications?: No In the past 12 months, has lack of transportation kept you from meetings, work, or from getting things needed for daily living?: No   Patient/Family verbalized understanding of follow-up arrangements:  Yes  Individual responsible for coordination of the follow-up plan: Daughter Donna Petty  Confirmed correct DME delivered: Donna Petty  Donna Petty 06/16/2024    Comments (or additional information): Declined 3-1 commode and will purchase bed rail oop.   Summary of Stay    Date/Time Discharge Planning CSW  06/14/24 1656 Plans to discharge back home with daughter, Donna Petty (guardian) who will provide care. [facility] reinstated. Has a RW and rollator - ordered BSC. DS  06/08/24 1023 Plans to  discharge back home with daughter, Donna Petty (guardian) who will provide care. Had HH with [facility]...will plan to reinstate. Has a RW and rollator. Will set up family education for daughter. DS  06/07/24 1106 Plans to discharge back home with daughter, Donna Petty (guardian) who will provide care. Had [facility] 01/2024. Has a RW and rollator. Will await therapy follow-up recommendations. DS       Donna Petty  Donna Petty

## 2024-06-17 DIAGNOSIS — F02818 Dementia in other diseases classified elsewhere, unspecified severity, with other behavioral disturbance: Secondary | ICD-10-CM | POA: Diagnosis not present

## 2024-06-17 DIAGNOSIS — K219 Gastro-esophageal reflux disease without esophagitis: Secondary | ICD-10-CM | POA: Diagnosis not present

## 2024-06-17 DIAGNOSIS — I69398 Other sequelae of cerebral infarction: Secondary | ICD-10-CM | POA: Diagnosis not present

## 2024-06-17 DIAGNOSIS — F0154 Vascular dementia, unspecified severity, with anxiety: Secondary | ICD-10-CM | POA: Diagnosis not present

## 2024-06-17 DIAGNOSIS — N1831 Chronic kidney disease, stage 3a: Secondary | ICD-10-CM | POA: Diagnosis not present

## 2024-06-17 DIAGNOSIS — H547 Unspecified visual loss: Secondary | ICD-10-CM | POA: Diagnosis not present

## 2024-06-17 DIAGNOSIS — G894 Chronic pain syndrome: Secondary | ICD-10-CM | POA: Diagnosis not present

## 2024-06-17 DIAGNOSIS — G4700 Insomnia, unspecified: Secondary | ICD-10-CM | POA: Diagnosis not present

## 2024-06-17 DIAGNOSIS — I69354 Hemiplegia and hemiparesis following cerebral infarction affecting left non-dominant side: Secondary | ICD-10-CM | POA: Diagnosis not present

## 2024-06-17 DIAGNOSIS — F01518 Vascular dementia, unspecified severity, with other behavioral disturbance: Secondary | ICD-10-CM | POA: Diagnosis not present

## 2024-06-17 DIAGNOSIS — I4892 Unspecified atrial flutter: Secondary | ICD-10-CM | POA: Diagnosis not present

## 2024-06-17 DIAGNOSIS — E669 Obesity, unspecified: Secondary | ICD-10-CM | POA: Diagnosis not present

## 2024-06-17 DIAGNOSIS — H5347 Heteronymous bilateral field defects: Secondary | ICD-10-CM | POA: Diagnosis not present

## 2024-06-17 DIAGNOSIS — E785 Hyperlipidemia, unspecified: Secondary | ICD-10-CM | POA: Diagnosis not present

## 2024-06-17 DIAGNOSIS — Z96642 Presence of left artificial hip joint: Secondary | ICD-10-CM | POA: Diagnosis not present

## 2024-06-17 DIAGNOSIS — I129 Hypertensive chronic kidney disease with stage 1 through stage 4 chronic kidney disease, or unspecified chronic kidney disease: Secondary | ICD-10-CM | POA: Diagnosis not present

## 2024-06-17 DIAGNOSIS — F4321 Adjustment disorder with depressed mood: Secondary | ICD-10-CM | POA: Diagnosis not present

## 2024-06-17 DIAGNOSIS — F411 Generalized anxiety disorder: Secondary | ICD-10-CM | POA: Diagnosis not present

## 2024-06-17 DIAGNOSIS — F0153 Vascular dementia, unspecified severity, with mood disturbance: Secondary | ICD-10-CM | POA: Diagnosis not present

## 2024-06-17 DIAGNOSIS — E039 Hypothyroidism, unspecified: Secondary | ICD-10-CM | POA: Diagnosis not present

## 2024-06-17 DIAGNOSIS — Z6832 Body mass index (BMI) 32.0-32.9, adult: Secondary | ICD-10-CM | POA: Diagnosis not present

## 2024-06-17 DIAGNOSIS — F0284 Dementia in other diseases classified elsewhere, unspecified severity, with anxiety: Secondary | ICD-10-CM | POA: Diagnosis not present

## 2024-06-17 DIAGNOSIS — H3530 Unspecified macular degeneration: Secondary | ICD-10-CM | POA: Diagnosis not present

## 2024-06-17 DIAGNOSIS — F0283 Dementia in other diseases classified elsewhere, unspecified severity, with mood disturbance: Secondary | ICD-10-CM | POA: Diagnosis not present

## 2024-06-22 DIAGNOSIS — I69354 Hemiplegia and hemiparesis following cerebral infarction affecting left non-dominant side: Secondary | ICD-10-CM | POA: Diagnosis not present

## 2024-06-22 DIAGNOSIS — H5347 Heteronymous bilateral field defects: Secondary | ICD-10-CM | POA: Diagnosis not present

## 2024-06-22 DIAGNOSIS — R600 Localized edema: Secondary | ICD-10-CM | POA: Diagnosis not present

## 2024-06-22 DIAGNOSIS — N1831 Chronic kidney disease, stage 3a: Secondary | ICD-10-CM | POA: Diagnosis not present

## 2024-06-22 DIAGNOSIS — Z8673 Personal history of transient ischemic attack (TIA), and cerebral infarction without residual deficits: Secondary | ICD-10-CM | POA: Diagnosis not present

## 2024-06-22 DIAGNOSIS — R413 Other amnesia: Secondary | ICD-10-CM | POA: Diagnosis not present

## 2024-06-22 DIAGNOSIS — I129 Hypertensive chronic kidney disease with stage 1 through stage 4 chronic kidney disease, or unspecified chronic kidney disease: Secondary | ICD-10-CM | POA: Diagnosis not present

## 2024-06-22 DIAGNOSIS — R2689 Other abnormalities of gait and mobility: Secondary | ICD-10-CM | POA: Diagnosis not present

## 2024-06-22 DIAGNOSIS — Z09 Encounter for follow-up examination after completed treatment for conditions other than malignant neoplasm: Secondary | ICD-10-CM | POA: Diagnosis not present

## 2024-06-22 DIAGNOSIS — R509 Fever, unspecified: Secondary | ICD-10-CM | POA: Diagnosis not present

## 2024-07-10 NOTE — Progress Notes (Deleted)
  Cardiology Office Note:   Date:  07/10/2024  ID:  TAWNIE Petty, DOB Dec 07, 1935, MRN [id number] PCP: Corlis Pagan, NP  [facility] Providers Cardiologist:  Lynwood Schilling, MD {  History of Present Illness:   Donna Petty is a 88 y.o. female who we saw hi the hospital recently when sh had a TIA.   She was seen remotely by Dr. Court in 2015 for carotid artery disease.  Presented to the ED on 05/24/24 with complaints of left-sided weakness.  Code stroke was activated and she was noted to be hypertensive with BP of 190 systolic.  She was found to have a right sided gaze with left hemianopsia and left-sided extinction on arrival to the ED.  CT of the head with no hemorrhage.  Found to have right PCA P1 occlusion and mechanical thrombectomy was attempted.  Unfortunately the procedure was unsuccessful due to extreme tortuosity of the vertebral artery.  MRI showed a large right acute PCA infarct and conditional scattered small bilateral acute cerebral and cerebellar infarcts.  She was started on aspirin  and Plavix  via neurology.  Echocardiogram 10/22 with LVEF of 60 to 65%, no regional wall motion normality, mild asymmetric LVH of the basal septal segment, normal RV, mild MR.    Developed new onset atrial flutter the evening of 05/25/24. In review of telemetry this has been rate controlled in the 60-70s. She was unaware of the rhythm. Denies any palpitations or fluttering PTA.   ***  ROS: ***  Studies Reviewed:    EKG:       ***  Risk Assessment/Calculations:   {Does this patient have ATRIAL FIBRILLATION?:[id number]} No BP recorded.  {Refresh Note OR Click here to enter BP  :1}***        Physical Exam:   VS:  There were no vitals taken for this visit.   Wt Readings from Last 3 Encounters:  06/02/24 163 lb 12.8 oz (74.3 kg)  05/24/24 170 lb 10.2 oz (77.4 kg)  01/26/24 171 lb (77.6 kg)     GEN: Well nourished, well developed in no acute distress NECK: No JVD; No carotid  bruits CARDIAC: ***RR, *** murmurs, rubs, gallops RESPIRATORY:  Clear to auscultation without rales, wheezing or rhonchi  ABDOMEN: Soft, non-tender, non-distended EXTREMITIES:  No edema; No deformity   ASSESSMENT AND PLAN:   New onset atrial flutter:  ***  -- developed the evening of 10/22, rates well controlled in the 60-70 range -- she is asymptomatic, no palpitations/fluttering PTA -- echo with normal LV function, normal RV -- ChadsVasc of 5, therefore would recommendation starting Eliquis  5mg  BID once clear from neurology standpoint, 3-5 days -- no need for AVN at this time   Acute CVA, right PCA territory:  ***  -- mechanical thrombectomy attempted but unsuccessful -- currently on ASA, plavix  but now with dx of atrial flutter would transition to Eliquis  once ok with neurology -- on statin    HLD:  ***  -- LDL 95, HDL 40 -- on atorvastatin  40mg  daily    Hypothyroidism:  ***  -- check TSH -- continue synthroid    HTN:  ***  -- continue amlodipine  5mg  daily     Follow up ***  Signed, Lynwood Schilling, MD

## 2024-07-11 ENCOUNTER — Ambulatory Visit: Admitting: Cardiology

## 2024-07-11 ENCOUNTER — Encounter: Admitting: Physical Medicine & Rehabilitation

## 2024-07-11 DIAGNOSIS — I4892 Unspecified atrial flutter: Secondary | ICD-10-CM

## 2024-07-11 DIAGNOSIS — I10 Essential (primary) hypertension: Secondary | ICD-10-CM

## 2024-07-11 DIAGNOSIS — E785 Hyperlipidemia, unspecified: Secondary | ICD-10-CM

## 2024-07-14 ENCOUNTER — Encounter: Admitting: Physical Medicine & Rehabilitation

## 2024-07-15 ENCOUNTER — Encounter: Admitting: Physical Medicine & Rehabilitation

## 2024-07-15 ENCOUNTER — Encounter: Payer: Self-pay | Admitting: Physical Medicine & Rehabilitation

## 2024-07-15 VITALS — BP 165/90 | HR 76 | Ht 61.0 in

## 2024-07-15 DIAGNOSIS — I4892 Unspecified atrial flutter: Secondary | ICD-10-CM | POA: Diagnosis present

## 2024-07-15 DIAGNOSIS — F0390 Unspecified dementia without behavioral disturbance: Secondary | ICD-10-CM | POA: Insufficient documentation

## 2024-07-15 DIAGNOSIS — F4321 Adjustment disorder with depressed mood: Secondary | ICD-10-CM | POA: Insufficient documentation

## 2024-07-15 DIAGNOSIS — I63531 Cerebral infarction due to unspecified occlusion or stenosis of right posterior cerebral artery: Secondary | ICD-10-CM | POA: Diagnosis not present

## 2024-07-15 DIAGNOSIS — M7989 Other specified soft tissue disorders: Secondary | ICD-10-CM | POA: Diagnosis present

## 2024-07-15 NOTE — Progress Notes (Signed)
 Subjective:    Donna Petty ID: Donna Petty, female    DOB: 1936-01-18, 88 y.o.   MRN: [id number]  HPI  Donna Petty is here for follow-up after completing treatment at [facility].  Donna Petty continues to work with home PT and OT and also is performing daily exercises. Donna Petty walks with a walker inside the house, with Donna Petty ambulation limited to walking from Donna Petty recliner to the kitchen, around the table, and to Donna Petty bedroom before returning. Donna Petty cannot walk long distances. There have been no reported falls with walker use. Donna Petty requires assistance getting in and out of bed and utilizes a bed rail. Prior to Donna Petty stroke, Donna Petty used a walker mainly for outings following hip surgery but used a cane or no assistive device indoors.   Regarding activities of daily living, Donna Petty eats independently and can get to the bathroom on Donna Petty own but needs assistance with wiping, especially after bowel movements. Donna Petty also needs assistance with showering and is currently performing bird baths due to bathroom constraints, including a tight space and a cracked bathtub. Occupational therapy working on the use activities. Donna Petty denies current pain and takes one Tylenol  at night for management.  Donna Petty has been experiencing issues with bowel control, with one to two small, formed, loose bowel movements daily. Donna Petty occasionally has accidents due to a delayed recognition of the need to defecate. Donna Petty is not currently using any stool softeners or laxatives.  Donna Petty mood is reported as improved, and Donna Petty appears quieter, denying any sadness or depression. Donna Petty continues antidepressant as advised by Donna Petty primary care physician. Donna Petty exhibits some confusion during exercise instructions, requiring re-demonstration, but remains oriented to person, place, and time. Donna Petty has a history of memory problems/dementia.  Donna Petty reports continued left leg swelling, which makes shoe fitting difficult.  Donna Petty denies pain in the left leg. Donna Petty was previously on Lasix , which was discontinued,  and daughter reports Donna Petty primary care physician advised awaiting a cardiologist's recommendation before restarting. 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   Donna Petty neurologist appointment is scheduled. Donna Petty cardiologist appointment was rescheduled due to snow and is now approximately one month away.   Pain Inventory Average Pain 0 Pain Right Now 0  BOWEL Number of stools per week: 7  BLADDER Pads  Mobility use a walker ability to climb steps?  yes do you drive?  no  Function retired I need assistance with the following:  dressing, bathing, and toileting  Neuro/Psych bladder control problems bowel control problems trouble walking  Prior Studies Any changes since last visit?  yes  has seen Donna Petty PCP, has appt with Neuro 12/23 and the cardiologist appt is 1/23  Physicians involved in your care Any changes since last visit?  no   Family History  Problem Relation Age of Onset   Hypertension Father    Social History   Socioeconomic History   Marital status: Widowed    Spouse name: Not on file   Number of children: Not on file   Years of education: Not on file   Highest education level: Not on file  Occupational History   Not on file  Tobacco Use   Smoking status: Never   Smokeless tobacco: Never  Vaping Use   Vaping status: Never Used  Substance and Sexual Activity   Alcohol use: No   Drug use: No   Sexual activity: Not on file  Other Topics Concern  Not on file  Social History Narrative   Not on file   Social Drivers of Health   Tobacco Use: Low Risk (07/15/2024)   Donna Petty History    Smoking Tobacco Use: Never    Smokeless Tobacco Use: Never    Passive Exposure: Not on file  Financial Resource Strain: Not on file  Food Insecurity: No Food Insecurity (05/25/2024)   Epic    Worried About Programme Researcher, Broadcasting/film/video in the Last Year: Never true    Ran Out of Food in the Last Year: Never  true  Transportation Needs: No Transportation Needs (05/25/2024)   Epic    Lack of Transportation (Medical): No    Lack of Transportation (Non-Medical): No  Physical Activity: Not on file  Stress: Not on file  Social Connections: Moderately Integrated (05/25/2024)   Social Connection and Isolation Panel    Frequency of Communication with Friends and Family: Three times a week    Frequency of Social Gatherings with Friends and Family: Three times a week    Attends Religious Services: More than 4 times per year    Active Member of Clubs or Organizations: Yes    Attends Banker Meetings: More than 4 times per year    Marital Status: Widowed  Depression (PHQ2-9): Low Risk (07/15/2024)   Depression (PHQ2-9)    PHQ-2 Score: 3  Alcohol Screen: Not on file  Housing: Low Risk (05/25/2024)   Epic    Unable to Pay for Housing in the Last Year: No    Number of Times Moved in the Last Year: 0    Homeless in the Last Year: No  Utilities: Not At Risk (05/25/2024)   Epic    Threatened with loss of utilities: No  Health Literacy: Not on file   Past Surgical History:  Procedure Laterality Date   CATARACT EXTRACTION, BILATERAL     CHOLECYSTECTOMY     COLONOSCOPY     ESOPHAGOGASTRODUODENOSCOPY ENDOSCOPY     IR ANGIO VERTEBRAL SEL SUBCLAVIAN INNOMINATE UNI R MOD SED  05/24/2024   IR PERCUTANEOUS ART THROMBECTOMY/INFUSION INTRACRANIAL INC DIAG ANGIO  05/24/2024   IR US  GUIDE VASC ACCESS RIGHT  05/24/2024   RADIOLOGY WITH ANESTHESIA N/A 05/24/2024   Procedure: RADIOLOGY WITH ANESTHESIA;  Surgeon: Radiologist, Medication, MD;  Location: [facility] OR;  Service: Radiology;  Laterality: N/A;   TOTAL HIP ARTHROPLASTY Left 01/26/2024   Procedure: ARTHROPLASTY, HIP, TOTAL, ANTERIOR APPROACH;  Surgeon: Ernie Cough, MD;  Location: [facility] ORS;  Service: Orthopedics;  Laterality: Left;   TOTAL KNEE ARTHROPLASTY Left 06/26/2020   Procedure: TOTAL KNEE ARTHROPLASTY;  Surgeon: Ernie Cough, MD;  Location:  [facility] ORS;  Service: Orthopedics;  Laterality: Left;  70 mins   Past Medical History:  Diagnosis Date   Acid reflux    Arthritis    Carotid artery disease    50-70% left internal carotid artery stenosis   Depression    Dyspnea    Hypothyroidism    Macular degeneration    Thyroid  condition    Visual changes    BP (!) 165/90   Pulse 76   Ht 5' 1 (1.549 m)   SpO2 90%   BMI 30.95 kg/m   Opioid Risk Score:   Fall Risk Score:  `1  Depression screen [facility] 2/9     07/15/2024    2:05 PM  Depression screen PHQ 2/9  Decreased Interest 1  Down, Depressed, Hopeless 0  PHQ - 2 Score 1  Altered sleeping 0  Tired,  decreased energy 2  Change in appetite 0  Feeling bad or failure about yourself  0  Trouble concentrating 0  Moving slowly or fidgety/restless 0  Suicidal thoughts 0  PHQ-9 Score 3     Review of Systems  Respiratory:  Positive for shortness of breath.   Cardiovascular:  Positive for leg swelling.  Gastrointestinal:        Bowel control  Genitourinary:        Bladder control  Musculoskeletal:  Positive for gait problem.  Neurological:  Positive for weakness.  All other systems reviewed and are negative.      Objective:   Physical Exam  Gen: no distress, normal appearing HEENT: oral mucosa pink and moist, NCAT Chest: normal effort, normal rate of breathing Abd: soft, non-distended Ext: Mild R > L edema Psych: A little flat but cooperative Skin: No breakdown noted, some small areas of bruising noted on Donna Petty  Neuro:  alert and oriented x3, right gaze preference, usually answers in 1-2 word answers.  Follows simple commands  No hypertonia noted   No hyperreflexia noted in upper and lower Petty Hoffman's negative, no ankle clonus   MOTOR: RUE 5 out of 5  RLE 5 out of 5 with hip flexion knee extension and ankle plantarflexion 5 out of 5, ankle dorsiflexion 4+ out of 5 LUE 5 out of 5 LLE-  HF and ADF 4/5, knee extension and ankle  plantarflexion 4+ out of 5   Ambulates with a walker very hunched over-chronic due to back issues   Prior neuro assessment is c/w today's exam 06/15/2024.     Assessment & Plan:   1. Right PCA large infarct with multiple small scattered infarcts in bilateral hemispheres with R P1 occlusion status post IR but unsuccessful due to tortuosity of vessel - Continue home PT and OT  -Discussed calling office for follow-up visit if spasticity and muscle tightness develop 2.  Depressed mood/dementia.  Mood appears to be improved since Donna Petty has discharged home.  Donna Petty indicates Donna Petty is happier being at the home environment.  No SI or HI -On Lexapro  and Namenda   3.  Hypertension.  -BP elevated today however daughter reports blood pressure was well-controlled at home yesterday.  Donna Petty thinks it is from coming into the clinic.   - Donna Petty reports amlodipine  dose was decreased by PCP. -Continue to monitor and follow-up with PCP if remains elevated.  4. Aflutter- continue Eliquis  -Donna Petty has follow-up with cardiology scheduled 5.  Left leg swelling -No Homans sign, not particularly tender. -Discussed we could check vascular study for possible DVT, Donna Petty and daughter would like to hold off.  Donna Petty is currently on Eliquis  already. -Leg elevation, could retry compression stockings if blood pressure stable  Follow-up as needed

## 2024-07-25 NOTE — Progress Notes (Unsigned)
 " [facility] [address] [place]. [place] [address] [phone]       HOSPITAL FOLLOW UP NOTE  [name] Date of Birth:  05-Feb-1936 Medical Record Number:  [id number]   Reason for Referral:  hospital stroke follow up    SUBJECTIVE:   CHIEF COMPLAINT:  No chief complaint on file.   HPI:   Donna Petty is a 88 y.o. female with history of hypothyroidism, macular degeneration and depression who presented to ED on 05/24/2024 with left hemianopsia, right gaze deviation and left hemiparesis.  Stroke workup revealed right PCA large infarct with multiple small scattered infarcts in bilateral hemispheres with right P1 occlusion s/p unsuccessful IR, etiology cardioembolic from newly diagnosed atrial flutter.  She was placed on Eliquis  *** for a flutter and advised outpatient cardiology follow-up.  Further stroke workup showed right ICA origin plaque with 60% stenosis, bilateral cervical ICA tortuosity and possible FMD, moderate left ICA siphon stenosis and right M2 branch stenosis.  EF 60 to 65%.  LDL 95.  A1c 5.4.  She was started on atorvastatin  20 mg daily.  She was started on Seroquel  and Xanax  as needed for vascular dementia with anxiety and agitation, continued home memantine  and Remeron .  Therapies recommended CIR for functional decline.        PERTINENT IMAGING  code Stroke CT head No acute abnormality. Small vessel disease. ASPECTS 10.    CTA head & neck right PCA P1 segment occlusion, right ICA origin plaque with 60% stenosis, bilateral cervical ICA tortuosity and possible FMD, moderate left ICA siphon stenosis, mild right M2 branch stenoses S/p IR but unsuccessful due to extreme tortuous VA MRI large acute right PCA infarct, additional scattered small acute bilateral cerebral and cerebellar infarcts, moderate cerebral atrophy and chronic small vessel ischemic disease CT repeat R large PCA infarct stable, no hemorrahge 2D Echo EF 60-65%, LA normal   LDL 95 HgbA1c 5.4    ROS:   14 system review of systems performed and negative with exception of ***  PMH:  Past Medical History:  Diagnosis Date   Acid reflux    Arthritis    Carotid artery disease    50-70% left internal carotid artery stenosis   Depression    Dyspnea    Hypothyroidism    Macular degeneration    Thyroid  condition    Visual changes     PSH:  Past Surgical History:  Procedure Laterality Date   CATARACT EXTRACTION, BILATERAL     CHOLECYSTECTOMY     COLONOSCOPY     ESOPHAGOGASTRODUODENOSCOPY ENDOSCOPY     IR ANGIO VERTEBRAL SEL SUBCLAVIAN INNOMINATE UNI R MOD SED  05/24/2024   IR PERCUTANEOUS ART THROMBECTOMY/INFUSION INTRACRANIAL INC DIAG ANGIO  05/24/2024   IR US  GUIDE VASC ACCESS RIGHT  05/24/2024   RADIOLOGY WITH ANESTHESIA N/A 05/24/2024   Procedure: RADIOLOGY WITH ANESTHESIA;  Surgeon: Radiologist, Medication, MD;  Location: [facility] OR;  Service: Radiology;  Laterality: N/A;   TOTAL HIP ARTHROPLASTY Left 01/26/2024   Procedure: ARTHROPLASTY, HIP, TOTAL, ANTERIOR APPROACH;  Surgeon: Ernie Cough, MD;  Location: [facility] ORS;  Service: Orthopedics;  Laterality: Left;   TOTAL KNEE ARTHROPLASTY Left 06/26/2020   Procedure: TOTAL KNEE ARTHROPLASTY;  Surgeon: Ernie Cough, MD;  Location: [facility];  Service: Orthopedics;  Laterality: Left;  70 mins    Social History:  Social History   Socioeconomic History   Marital status: Widowed    Spouse name: Not on file   Number of children: Not  on file   Years of education: Not on file   Highest education level: Not on file  Occupational History   Not on file  Tobacco Use   Smoking status: Never   Smokeless tobacco: Never  Vaping Use   Vaping status: Never Used  Substance and Sexual Activity   Alcohol use: No   Drug use: No   Sexual activity: Not on file  Other Topics Concern   Not on file  Social History Narrative   Not on file   Social Drivers of Health   Tobacco Use: Low Risk (07/15/2024)   Patient  History    Smoking Tobacco Use: Never    Smokeless Tobacco Use: Never    Passive Exposure: Not on file  Financial Resource Strain: Not on file  Food Insecurity: No Food Insecurity (05/25/2024)   Epic    Worried About Programme Researcher, Broadcasting/film/video in the Last Year: Never true    Ran Out of Food in the Last Year: Never true  Transportation Needs: No Transportation Needs (05/25/2024)   Epic    Lack of Transportation (Medical): No    Lack of Transportation (Non-Medical): No  Physical Activity: Not on file  Stress: Not on file  Social Connections: Moderately Integrated (05/25/2024)   Social Connection and Isolation Panel    Frequency of Communication with Friends and Family: Three times a week    Frequency of Social Gatherings with Friends and Family: Three times a week    Attends Religious Services: More than 4 times per year    Active Member of Clubs or Organizations: Yes    Attends Banker Meetings: More than 4 times per year    Marital Status: Widowed  Intimate Partner Violence: Not At Risk (05/25/2024)   Epic    Fear of Current or Ex-Partner: No    Emotionally Abused: No    Physically Abused: No    Sexually Abused: No  Depression (PHQ2-9): Low Risk (07/15/2024)   Depression (PHQ2-9)    PHQ-2 Score: 3  Alcohol Screen: Not on file  Housing: Low Risk (05/25/2024)   Epic    Unable to Pay for Housing in the Last Year: No    Number of Times Moved in the Last Year: 0    Homeless in the Last Year: No  Utilities: Not At Risk (05/25/2024)   Epic    Threatened with loss of utilities: No  Health Literacy: Not on file    Family History:  Family History  Problem Relation Age of Onset   Hypertension Father     Medications:  Medications Ordered Prior to Encounter[1]  Allergies:  Allergies[2]    OBJECTIVE:  Physical Exam  There were no vitals filed for this visit. There is no height or weight on file to calculate BMI. No results found.   General: well developed,  well nourished, seated, in no evident distress Head: head normocephalic and atraumatic.   Neck: supple with no carotid or supraclavicular bruits Cardiovascular: regular rate and rhythm, no murmurs Musculoskeletal: no deformity Skin:  no rash/petichiae Vascular:  Normal pulses all extremities   Neurologic Exam Mental Status: Awake and fully alert. Oriented to place and time. Recent and remote memory intact. Attention span, concentration and fund of knowledge appropriate. Mood and affect appropriate.  Cranial Nerves: Fundoscopic exam reveals sharp disc margins. Pupils equal, briskly reactive to light. Extraocular movements full without nystagmus. Visual fields full to confrontation. Hearing intact. Facial sensation intact. Face, tongue, palate moves normally and symmetrically.  Motor: Normal bulk and tone. Normal strength in all tested extremity muscles Sensory.: intact to touch , pinprick , position and vibratory sensation.  Coordination: Rapid alternating movements normal in all extremities. Finger-to-nose and heel-to-shin performed accurately bilaterally. Gait and Station: Arises from chair without difficulty. Stance is normal. Gait demonstrates normal stride length and balance with ***. Tandem walk and heel toe ***.  Reflexes: 1+ and symmetric. Toes downgoing.     NIHSS  *** Modified Rankin  ***      ASSESSMENT: Donna Petty is a 88 y.o. year old female with right PCA large infarct with multiple small scattered infarcts in bilateral hemispheres on 05/24/2024 with right P1 occlusion s/p unsuccessful IR, etiology cardioembolic from newly diagnosed a flutter. Vascular risk factors include new dx of A-flutter, HTN, HLD, R>L ICA stenosis, HTN, HLD, advanced age and vascular dementia.      PLAN:  Right PCA stroke:  Residual deficit: ***.  Continue Eliquis  5mg  twice daily and atorvastatin  (Lipitor) for secondary stroke prevention managed/prescribed by PCP.   Discussed secondary stroke  prevention measures and importance of close PCP follow up for aggressive stroke risk factor management including BP goal<130/90, HLD with LDL goal<70 and DM with A1c.<7 .  Stroke labs 05/2024: LDL LDL 95, A1c 5.4 I have gone over the pathophysiology of stroke, warning signs and symptoms, risk factors and their management in some detail with instructions to go to the closest emergency room for symptoms of concern.  Carotid stenosis: CTA head/neck 05/2024 right ICA 60% stenosis and left ICA tortuosity and beaded appearance suggestive of FMD    Follow up in *** or call earlier if needed   CC:  [facility] provider: Dr. Rosemarie PCP: Corlis Pagan, NP    I personally spent a total of *** minutes in the care of the patient today including {Time Based Coding:[id number]}.    Harlene Bogaert, AGNP-BC  [facility] [address] [place], [place] [address]  Phone [phone] Fax [phone] Note: This document was prepared with digital dictation and possible smart phrase technology. Any transcriptional errors that result from this process are unintentional.         [1]  Current Outpatient Medications on File Prior to Visit  Medication Sig Dispense Refill   acetaminophen  (TYLENOL ) 325 MG tablet Take 2 tablets (650 mg total) by mouth every 4 (four) hours as needed for mild pain (pain score 1-3) or fever (or temp > 37.5 C (99.5 F)).     amLODipine  (NORVASC ) 10 MG tablet Take 1 tablet (10 mg total) by mouth daily. 30 tablet 0   apixaban  (ELIQUIS ) 5 MG TABS tablet Take 1 tablet (5 mg total) by mouth 2 (two) times daily. 60 tablet 0   atorvastatin  (LIPITOR) 20 MG tablet Take 1 tablet (20 mg total) by mouth daily. 30 tablet 0   Calcium -Vitamin D 600-200 MG-UNIT per tablet Take 1 tablet by mouth daily.     cetirizine  (ZYRTEC ) 10 MG tablet Take 1 tablet (10 mg total) by mouth daily. 30 tablet 0   doxepin  (SINEQUAN ) 10 MG capsule Take 1 capsule (10 mg total) by mouth at  bedtime. 30 capsule 0   escitalopram  (LEXAPRO ) 10 MG tablet Take 1 tablet (10 mg total) by mouth daily. 30 tablet 0   irbesartan  (AVAPRO ) 150 MG tablet Take 1 tablet (150 mg total) by mouth daily. 30 tablet 0   levothyroxine  (SYNTHROID ) 100 MCG tablet Take 1 tablet (100 mcg total) by mouth daily before breakfast. 30 tablet  0   memantine  (NAMENDA ) 5 MG tablet Take 1 tablet (5 mg total) by mouth 2 (two) times daily. 60 tablet 0   methocarbamol  (ROBAXIN ) 500 MG tablet Take 1 tablet (500 mg total) by mouth every 8 (eight) hours as needed for muscle spasms. 30 tablet 0   mirtazapine  (REMERON ) 15 MG tablet Take 1 tablet (15 mg total) by mouth at bedtime. 30 tablet 0   Multiple Vitamin (MULTIVITAMIN WITH MINERALS) TABS tablet Take 1 tablet by mouth daily.     Multiple Vitamins-Minerals (ICAPS AREDS 2 PO) Take 1 capsule by mouth 2 (two) times daily.     omeprazole  (PRILOSEC) 40 MG capsule Take 1 capsule (40 mg total) by mouth daily before breakfast. 30 capsule 0   polyethylene glycol (MIRALAX  / GLYCOLAX ) 17 g packet Take 17 g by mouth daily as needed.     No current facility-administered medications on file prior to visit.  [2]  Allergies Allergen Reactions   Simvastatin  Other (See Comments)    Per daughter of patient reports medication patient was on and causes severe leg pain. In chart for outpatient visit 01/2023.   "

## 2024-07-26 ENCOUNTER — Encounter: Payer: Self-pay | Admitting: Adult Health

## 2024-07-26 ENCOUNTER — Ambulatory Visit: Admitting: Adult Health

## 2024-07-26 VITALS — BP 143/62 | HR 72 | Ht 61.0 in | Wt 171.0 lb

## 2024-07-26 DIAGNOSIS — H53462 Homonymous bilateral field defects, left side: Secondary | ICD-10-CM | POA: Diagnosis not present

## 2024-07-26 DIAGNOSIS — I4892 Unspecified atrial flutter: Secondary | ICD-10-CM

## 2024-07-26 DIAGNOSIS — Z09 Encounter for follow-up examination after completed treatment for conditions other than malignant neoplasm: Secondary | ICD-10-CM | POA: Diagnosis not present

## 2024-07-26 DIAGNOSIS — I6521 Occlusion and stenosis of right carotid artery: Secondary | ICD-10-CM

## 2024-07-26 DIAGNOSIS — I63431 Cerebral infarction due to embolism of right posterior cerebral artery: Secondary | ICD-10-CM | POA: Diagnosis not present

## 2024-07-26 DIAGNOSIS — I69354 Hemiplegia and hemiparesis following cerebral infarction affecting left non-dominant side: Secondary | ICD-10-CM

## 2024-07-26 NOTE — Patient Instructions (Signed)
 Continue working with home health therapies, consider transitioning to outpatient therapies once completed  Continue Eliquis   and atorvastatin  for secondary stroke prevention  Follow-up with cardiology next month as scheduled  Continue to follow up with PCP regarding blood pressure and cholesterol management  Maintain strict control of hypertension with blood pressure goal below 130/90 and cholesterol with LDL cholesterol (bad cholesterol) goal below 70 mg/dL.   Signs of a Stroke? Follow the BEFAST method:  Balance Watch for a sudden loss of balance, trouble with coordination or vertigo Eyes Is there a sudden loss of vision in one or both eyes? Or double vision?  Face: Ask the person to smile. Does one side of the face droop or is it numb?  Arms: Ask the person to raise both arms. Does one arm drift downward? Is there weakness or numbness of a leg? Speech: Ask the person to repeat a simple phrase. Does the speech sound slurred/strange? Is the person confused ? Time: If you observe any of these signs, call 911.         Thank you for coming to see us  at [facility]. I hope we have been able to provide you high quality care today.  You may receive a patient satisfaction survey over the next few weeks. We would appreciate your feedback and comments so that we may continue to improve ourselves and the health of our patients.

## 2024-07-29 ENCOUNTER — Encounter: Payer: Self-pay | Admitting: Emergency Medicine

## 2024-07-29 ENCOUNTER — Ambulatory Visit: Attending: Emergency Medicine | Admitting: Emergency Medicine

## 2024-07-29 VITALS — BP 136/68 | HR 72 | Ht 61.0 in | Wt 170.0 lb

## 2024-07-29 DIAGNOSIS — R600 Localized edema: Secondary | ICD-10-CM | POA: Diagnosis not present

## 2024-07-29 DIAGNOSIS — I63531 Cerebral infarction due to unspecified occlusion or stenosis of right posterior cerebral artery: Secondary | ICD-10-CM | POA: Diagnosis not present

## 2024-07-29 DIAGNOSIS — I10 Essential (primary) hypertension: Secondary | ICD-10-CM | POA: Diagnosis not present

## 2024-07-29 DIAGNOSIS — Z79899 Other long term (current) drug therapy: Secondary | ICD-10-CM

## 2024-07-29 DIAGNOSIS — E785 Hyperlipidemia, unspecified: Secondary | ICD-10-CM | POA: Diagnosis not present

## 2024-07-29 DIAGNOSIS — I6521 Occlusion and stenosis of right carotid artery: Secondary | ICD-10-CM

## 2024-07-29 DIAGNOSIS — I4892 Unspecified atrial flutter: Secondary | ICD-10-CM

## 2024-07-29 LAB — COMPREHENSIVE METABOLIC PANEL WITH GFR
ALT: 11 IU/L (ref 0–32)
AST: 14 IU/L (ref 0–40)
Albumin: 4 g/dL (ref 3.7–4.7)
Alkaline Phosphatase: 100 IU/L (ref 48–129)
BUN/Creatinine Ratio: 11 — ABNORMAL LOW (ref 12–28)
BUN: 10 mg/dL (ref 8–27)
Bilirubin Total: 0.4 mg/dL (ref 0.0–1.2)
CO2: 24 mmol/L (ref 20–29)
Calcium: 9.2 mg/dL (ref 8.7–10.3)
Chloride: 102 mmol/L (ref 96–106)
Creatinine, Ser: 0.93 mg/dL (ref 0.57–1.00)
Globulin, Total: 2.1 g/dL (ref 1.5–4.5)
Glucose: 83 mg/dL (ref 70–99)
Potassium: 3.6 mmol/L (ref 3.5–5.2)
Sodium: 143 mmol/L (ref 134–144)
Total Protein: 6.1 g/dL (ref 6.0–8.5)
eGFR: 60 mL/min/1.73

## 2024-07-29 LAB — LIPID PANEL
Chol/HDL Ratio: 2.6 ratio (ref 0.0–4.4)
Cholesterol, Total: 141 mg/dL (ref 100–199)
HDL: 54 mg/dL
LDL Chol Calc (NIH): 67 mg/dL (ref 0–99)
Triglycerides: 113 mg/dL (ref 0–149)
VLDL Cholesterol Cal: 20 mg/dL (ref 5–40)

## 2024-07-29 LAB — CBC
Hematocrit: 38.8 % (ref 34.0–46.6)
Hemoglobin: 12.5 g/dL (ref 11.1–15.9)
MCH: 31 pg (ref 26.6–33.0)
MCHC: 32.2 g/dL (ref 31.5–35.7)
MCV: 96 fL (ref 79–97)
Platelets: 402 x10E3/uL (ref 150–450)
RBC: 4.03 x10E6/uL (ref 3.77–5.28)
RDW: 13.8 % (ref 11.7–15.4)
WBC: 9.3 x10E3/uL (ref 3.4–10.8)

## 2024-07-29 MED ORDER — POTASSIUM CHLORIDE CRYS ER 10 MEQ PO TBCR: 10.0000 meq | EXTENDED_RELEASE_TABLET | Freq: Every day | ORAL | 3 refills | Status: AC

## 2024-07-29 MED ORDER — AMLODIPINE BESYLATE 10 MG PO TABS: 5.0000 mg | ORAL_TABLET | Freq: Every day | ORAL | 0 refills | Status: AC

## 2024-07-29 MED ORDER — FUROSEMIDE 20 MG PO TABS: 20.0000 mg | ORAL_TABLET | Freq: Every day | ORAL | 3 refills | Status: AC

## 2024-07-29 NOTE — Progress Notes (Signed)
 " Cardiology Office Note:    Date:  07/29/2024  ID:  Donna Petty, DOB [birth year], MRN [id number] PCP: Corlis Pagan, NP  [facility] Providers Cardiologist:  Lynwood Schilling, MD       Patient Profile:       Chief Complaint: Hospital follow-up History of Present Illness:  Donna Petty is a 88 y.o. female with visit-pertinent history of hypothyroidism, carotid artery disease, GERD, atrial flutter, CVA, hyperlipidemia, hypertension, stage IIIa CKD, vascular dementia, carotid stenosis  She was seen remotely by Dr. Court in 2015 for carotid artery disease  She presented to the emergency department on 05/24/2024 with complaints of left-sided weakness.  Code stroke was activated she was noted to be hypertensive with blood pressure 190 systolic.  She was found to have a right sided gaze with left t hemianopsia and left-sided extinction on arrival to the ED.  CT of the head was without hemorrhage.  Found to have right PCA P1 occlusion and mechanical thrombectomy was attempted.  Unfortunately procedure was unsuccessful due to extreme after tortuosity of the vertebral artery.  MRI did show a large right acute PCA infarct and conditional scattered small bilateral acute cerebral and cerebellar infarcts.  She was started on aspirin  and Plavix  via neurology.  Show an echocardiogram on 10/22 with LVEF 60 to 65%, no RWMA, mild asymmetric LVH of the basal septal segment, normal RV, and mild MR.  During hospitalization she developed new onset atrial flutter the evening of 10/22.  She was rate controlled at that time.  Plan was for rate control strategy and no plans to pursue cardioversion.  Patient ultimately h placed on Eliquis  for both CVA prophylaxis and atrial flutter with aspirin  and Plavix  discontinued.   Discussed the use of AI scribe software for clinical note transcription with the patient, who gave verbal consent to proceed.  History of Present Illness Donna Petty is an 88 year old female  with atrial flutter and a history of stroke who presents for cardiology follow-up. She is accompanied by her daughter, [name].  Today patient is doing well without complaints of palpitations or tachycardia.  She denies any symptoms concerning for recurrent atrial flutter.  Furthermore she is without complaints of chest pains or dyspnea today.  Working with PT/OT at home and doing well without significant limitation.  She does have worsening bilateral leg swelling that makes walking and wearing shoes difficult.  Her PCP recently decreased her amlodipine  from 10 to 5 mg daily.  She has elevated cholesterol and recently started atorvastatin  20 mg. Her prior LDL was 95 mg/dL.  She is without orthopnea, PND, lightheadedness, dizziness, syncope, presyncope, palpitations, chest discomfort   Review of systems:  Please see the history of present illness. All other systems are reviewed and otherwise negative.      Studies Reviewed:    EKG Interpretation Date/Time:  Friday July 29 2024 13:29:35 EST Ventricular Rate:  72 PR Interval:  234 QRS Duration:  76 QT Interval:  412 QTC Calculation: 451 R Axis:   0  Text Interpretation: Sinus rhythm with 1st degree A-V block Possible Anterior infarct , age undetermined When compared with ECG of 26-May-2024 12:04, Sinus rhythm has replaced Atrial fibrillation Non-specific change in ST segment in Inferior leads Nonspecific T wave abnormality, worse in Anterior leads Confirmed by Rana Dixon [phone]) on 07/29/2024 4:59:42 PM    Echocardiogram 05/25/2024  1. Left ventricular ejection fraction, by estimation, is 60 to 65%. The  left ventricle has normal function.  The left ventricle has no regional  wall motion abnormalities. There is mild asymmetric left ventricular  hypertrophy of the basal-septal segment.  Left ventricular diastolic parameters were normal.   2. Right ventricular systolic function is normal. The right ventricular  size is normal.  There is normal pulmonary artery systolic pressure.   3. The mitral valve is normal in structure. Mild mitral valve  regurgitation. No evidence of mitral stenosis.   4. The aortic valve is grossly normal. There is mild calcification of the  aortic valve. Aortic valve regurgitation is not visualized. Aortic valve  sclerosis is present, with no evidence of aortic valve stenosis.   5. The inferior vena cava is normal in size with greater than 50%  respiratory variability, suggesting right atrial pressure of 3 mmHg.   Risk Assessment/Calculations:    CHA2DS2-VASc Score = 6   This indicates a 9.7% annual risk of stroke. The patient's score is based upon: CHF History: 0 HTN History: 1 Diabetes History: 0 Stroke History: 2 Vascular Disease History: 0 Age Score: 2 Gender Score: 1             Physical Exam:   VS:  BP 136/68 (BP Location: Right Arm, Patient Position: Sitting, Cuff Size: Normal)   Pulse 72   Ht 5' 1 (1.549 m)   Wt 170 lb (77.1 kg)   BMI 32.12 kg/m    Wt Readings from Last 3 Encounters:  07/29/24 170 lb (77.1 kg)  07/26/24 171 lb (77.6 kg)  06/02/24 163 lb 12.8 oz (74.3 kg)    GEN: Well nourished, well developed in no acute distress NECK: No JVD; No carotid bruits CARDIAC: RRR, no murmurs, rubs, gallops RESPIRATORY:  Clear to auscultation without rales, wheezing or rhonchi  ABDOMEN: Soft, non-tender, non-distended EXTREMITIES:  No edema; No acute deformity       Assessment and Plan:  Atrial flutter Diagnosed 05/2024 in the setting of cardioembolic stroke Echo 05/2024 LVEF 60 to 65%, no RWMA, RV normal, mild MR - EKG today shows patient has self-converted to normal sinus rhythm (did review EKG with EP today) - She remains asymptomatic without palpitations or chest fluttering.  She is without symptoms concerning for recurrent atrial flutter - Continue Eliquis  5 mg twice daily, no bleeding concerns - Plan for CBC and BMET today  Hypertension Blood pressure  today is controlled at 136/68 - Continue amlodipine  5 mg daily and irbesartan  150 mg daily  Hyperlipidemia, LDL goal <70 LDL 95 on 05/2024 and was subsequently started on statin lowering therapy - Plan for repeat LDL today  Carotid stenosis CTA head/neck 05/2024 with right carotid bifurcation atherosclerosis resulting in estimated 60% right ICA origin stenosis.  Left carotid bifurcation atherosclerosis without stenosis.  Tortuosity and beaded appearance of the left ICA distal to the bulb suggest FMD.  There is no dissection or occlusion - No interventions warranted at this time.  Will continue to monitor at this time - Continue Eliquis  and atorvastatin  for secondary stroke prevention  History of CVA S/p CVA on 05/2024 with MRI showing large acute right PCA infarct with additional scattered small acute bilateral cerebral and cerebellar infarct - Managed on Eliquis  and atorvastatin  for secondary stroke prevention - Management per neurology  History of dementia - Managed on Lexapro  and Namenda   Lower extremity edema Patient has voiced concern about her lower extremity swelling since her discharge home.  Now having difficulty wearing her normal shoes Her Lasix  was previously stopped during hospitalization and then her PCP reduced  her amlodipine  from 10 to 5 mg on 11/19 and she has been without any improvement Echo 05/2024 showed LVEF 60 to 65%, no RWMA, normal diastolic parameters - Likely in the setting of venous insufficiency - I will resume of her PTA furosemide  at 20 mg daily with 10 mEq potassium supplementation - I have encouraged lower extremity stockings, low-sodium diet, leg elevation - BMET today and repeat in 2-3 weeks       Dispo:  Return in about 3 months (around 10/27/2024).  Signed, Lum LITTIE Louis, NP  "

## 2024-07-29 NOTE — Patient Instructions (Signed)
 Medication Instructions:  START TAKING FUROSEMIDE  20 MG DAILY. START TAKING POTASSIUM CHLORIDE  10 MEQ DAILY.  Lab Work: FASTING LIPID PANEL, CBC, AND CMET TO BE DONE TODAY. REPEAT BMET IN 2-3 WEEKS.   Testing/Procedures: NONE  Follow-Up: At [facility], you and your health needs are our priority.  As part of our continuing mission to provide you with exceptional heart care, our providers are all part of one team.  This team includes your primary Cardiologist (physician) and Advanced Practice Providers or APPs (Physician Assistants and Nurse Practitioners) who all work together to provide you with the care you need, when you need it.  Your next appointment:   3 MONTHS WITH DR. [facility], MD  Provider:   Lynwood Schilling, MD

## 2024-08-02 ENCOUNTER — Ambulatory Visit: Payer: Self-pay | Admitting: Emergency Medicine

## 2024-08-03 NOTE — Progress Notes (Signed)
 I agree with the above plan

## 2024-08-10 ENCOUNTER — Telehealth: Payer: Self-pay | Admitting: Cardiology

## 2024-08-10 NOTE — Telephone Encounter (Signed)
 Pt asking if there is a generic brand of Eliquis  they could do as it is no longer covered by insurance.

## 2024-08-10 NOTE — Telephone Encounter (Signed)
 Patient identification verified by 2 forms.   Called and spoke to patient's daughter Patient's daughter states:  -When picking up the medication (at [facility])  the cost $230+ for one month.  -Pt is not able to get this medications.              Interventions/Plan: -Will get message to our med assistance team   Patient's daughter agrees with plan, no questions at this time

## 2024-08-11 ENCOUNTER — Other Ambulatory Visit (HOSPITAL_COMMUNITY): Payer: Self-pay

## 2024-08-11 ENCOUNTER — Telehealth: Payer: Self-pay | Admitting: Pharmacy Technician

## 2024-08-11 NOTE — Telephone Encounter (Signed)
 She has a 403.66 deductible then cost would go to 47.00.    She doesn't appear to have cardiomyopathy-so can not get a grant   Xarelto is just as expensive until she meets that deductible.    [facility] requires 3% of income to of been met before approving patient assistance. [employer] for [employer] does not   Generic pradaxa is 56.66 on goodrx at [facility] for 30 days   I called the daughter and she said she also spoke to the insurance and they will pay for the eliquis  since insurance told her about the deductible as well.

## 2024-08-11 NOTE — Telephone Encounter (Signed)
 Eliquis    She has a 403.66 deductible then cost would go to 47.00.   She doesn't appear to have cardiomyopathy-so can not get a grant  Xarelto is just as expensive until she meets that deductible.   [facility] requires 3% of income to of been met before approving patient assistance. [employer] for [employer] does not  Generic pradaxa is 56.66 on goodrx at [facility] for 30 days  I called the daughter and she said she also spoke to the insurance and they will pay for the eliquis  since insurance told her about the deductible as well.

## 2024-08-18 LAB — BASIC METABOLIC PANEL WITH GFR
BUN/Creatinine Ratio: 12 (ref 12–28)
BUN: 13 mg/dL (ref 8–27)
CO2: 23 mmol/L (ref 20–29)
Calcium: 9.6 mg/dL (ref 8.7–10.3)
Chloride: 104 mmol/L (ref 96–106)
Creatinine, Ser: 1.13 mg/dL — ABNORMAL HIGH (ref 0.57–1.00)
Glucose: 85 mg/dL (ref 70–99)
Potassium: 4.3 mmol/L (ref 3.5–5.2)
Sodium: 141 mmol/L (ref 134–144)
eGFR: 47 mL/min/1.73 — ABNORMAL LOW

## 2024-08-26 ENCOUNTER — Ambulatory Visit: Admitting: Cardiology

## 2024-10-20 ENCOUNTER — Ambulatory Visit: Admitting: Cardiology
